# Patient Record
Sex: Female | Born: 1935 | Race: White | Hispanic: No | Smoking: Never smoker
Health system: Southern US, Community
[De-identification: ages and names within clinical notes are randomized; demographics above are authoritative.]

## PROBLEM LIST (undated history)

## (undated) DIAGNOSIS — B338 Other specified viral diseases: Secondary | ICD-10-CM

## (undated) DIAGNOSIS — E119 Type 2 diabetes mellitus without complications: Secondary | ICD-10-CM

## (undated) DIAGNOSIS — M1990 Unspecified osteoarthritis, unspecified site: Secondary | ICD-10-CM

## (undated) DIAGNOSIS — G5600 Carpal tunnel syndrome, unspecified upper limb: Secondary | ICD-10-CM

## (undated) DIAGNOSIS — H3530 Unspecified macular degeneration: Secondary | ICD-10-CM

## (undated) DIAGNOSIS — N390 Urinary tract infection, site not specified: Secondary | ICD-10-CM

## (undated) DIAGNOSIS — E871 Hypo-osmolality and hyponatremia: Secondary | ICD-10-CM

## (undated) HISTORY — PX: BACK SURGERY: SHX140

## (undated) HISTORY — PX: TONSILLECTOMY: SUR1361

## (undated) HISTORY — PX: APPENDECTOMY: SHX54

## (undated) HISTORY — PX: CHOLECYSTECTOMY: SHX55

## (undated) HISTORY — PX: HIP SURGERY: SHX245

## (undated) HISTORY — PX: ABDOMINAL HYSTERECTOMY: SHX81

---

## 2015-02-22 DIAGNOSIS — L570 Actinic keratosis: Secondary | ICD-10-CM | POA: Diagnosis not present

## 2015-02-27 DIAGNOSIS — E782 Mixed hyperlipidemia: Secondary | ICD-10-CM | POA: Diagnosis not present

## 2015-02-27 DIAGNOSIS — I10 Essential (primary) hypertension: Secondary | ICD-10-CM | POA: Diagnosis not present

## 2015-02-27 DIAGNOSIS — E1165 Type 2 diabetes mellitus with hyperglycemia: Secondary | ICD-10-CM | POA: Diagnosis not present

## 2015-03-06 DIAGNOSIS — Z1389 Encounter for screening for other disorder: Secondary | ICD-10-CM | POA: Diagnosis not present

## 2015-03-06 DIAGNOSIS — I10 Essential (primary) hypertension: Secondary | ICD-10-CM | POA: Diagnosis not present

## 2015-03-06 DIAGNOSIS — M25552 Pain in left hip: Secondary | ICD-10-CM | POA: Diagnosis not present

## 2015-03-06 DIAGNOSIS — E1165 Type 2 diabetes mellitus with hyperglycemia: Secondary | ICD-10-CM | POA: Diagnosis not present

## 2015-03-06 DIAGNOSIS — M545 Low back pain: Secondary | ICD-10-CM | POA: Diagnosis not present

## 2015-03-06 DIAGNOSIS — E782 Mixed hyperlipidemia: Secondary | ICD-10-CM | POA: Diagnosis not present

## 2015-05-28 DIAGNOSIS — M79652 Pain in left thigh: Secondary | ICD-10-CM | POA: Diagnosis not present

## 2015-05-28 DIAGNOSIS — S199XXA Unspecified injury of neck, initial encounter: Secondary | ICD-10-CM | POA: Diagnosis not present

## 2015-05-28 DIAGNOSIS — S0990XA Unspecified injury of head, initial encounter: Secondary | ICD-10-CM | POA: Diagnosis not present

## 2015-05-28 DIAGNOSIS — W108XXA Fall (on) (from) other stairs and steps, initial encounter: Secondary | ICD-10-CM | POA: Diagnosis not present

## 2015-05-28 DIAGNOSIS — S4992XA Unspecified injury of left shoulder and upper arm, initial encounter: Secondary | ICD-10-CM | POA: Diagnosis not present

## 2015-05-28 DIAGNOSIS — Z886 Allergy status to analgesic agent status: Secondary | ICD-10-CM | POA: Diagnosis not present

## 2015-05-28 DIAGNOSIS — Z79899 Other long term (current) drug therapy: Secondary | ICD-10-CM | POA: Diagnosis not present

## 2015-05-28 DIAGNOSIS — S79922A Unspecified injury of left thigh, initial encounter: Secondary | ICD-10-CM | POA: Diagnosis not present

## 2015-05-28 DIAGNOSIS — S0191XA Laceration without foreign body of unspecified part of head, initial encounter: Secondary | ICD-10-CM | POA: Diagnosis not present

## 2015-05-28 DIAGNOSIS — Z880 Allergy status to penicillin: Secondary | ICD-10-CM | POA: Diagnosis not present

## 2015-05-28 DIAGNOSIS — E119 Type 2 diabetes mellitus without complications: Secondary | ICD-10-CM | POA: Diagnosis not present

## 2015-05-28 DIAGNOSIS — S0181XA Laceration without foreign body of other part of head, initial encounter: Secondary | ICD-10-CM | POA: Diagnosis not present

## 2015-05-28 DIAGNOSIS — W1789XA Other fall from one level to another, initial encounter: Secondary | ICD-10-CM | POA: Diagnosis not present

## 2015-05-28 DIAGNOSIS — M6281 Muscle weakness (generalized): Secondary | ICD-10-CM | POA: Diagnosis not present

## 2015-05-28 DIAGNOSIS — E871 Hypo-osmolality and hyponatremia: Secondary | ICD-10-CM | POA: Diagnosis not present

## 2015-05-28 DIAGNOSIS — M79622 Pain in left upper arm: Secondary | ICD-10-CM | POA: Diagnosis not present

## 2015-05-28 DIAGNOSIS — R262 Difficulty in walking, not elsewhere classified: Secondary | ICD-10-CM | POA: Diagnosis not present

## 2015-05-28 DIAGNOSIS — M1990 Unspecified osteoarthritis, unspecified site: Secondary | ICD-10-CM | POA: Diagnosis not present

## 2015-05-28 DIAGNOSIS — S2232XA Fracture of one rib, left side, initial encounter for closed fracture: Secondary | ICD-10-CM | POA: Diagnosis not present

## 2015-05-28 DIAGNOSIS — I10 Essential (primary) hypertension: Secondary | ICD-10-CM | POA: Diagnosis not present

## 2015-05-28 DIAGNOSIS — R079 Chest pain, unspecified: Secondary | ICD-10-CM | POA: Diagnosis not present

## 2015-05-29 DIAGNOSIS — S2232XA Fracture of one rib, left side, initial encounter for closed fracture: Secondary | ICD-10-CM | POA: Diagnosis not present

## 2015-05-29 DIAGNOSIS — S0181XA Laceration without foreign body of other part of head, initial encounter: Secondary | ICD-10-CM | POA: Diagnosis not present

## 2015-05-29 DIAGNOSIS — W1789XA Other fall from one level to another, initial encounter: Secondary | ICD-10-CM | POA: Diagnosis not present

## 2015-05-30 DIAGNOSIS — Z79899 Other long term (current) drug therapy: Secondary | ICD-10-CM | POA: Diagnosis not present

## 2015-05-30 DIAGNOSIS — E119 Type 2 diabetes mellitus without complications: Secondary | ICD-10-CM | POA: Diagnosis not present

## 2015-05-30 DIAGNOSIS — M1990 Unspecified osteoarthritis, unspecified site: Secondary | ICD-10-CM | POA: Diagnosis not present

## 2015-05-30 DIAGNOSIS — S2232XK Fracture of one rib, left side, subsequent encounter for fracture with nonunion: Secondary | ICD-10-CM | POA: Diagnosis not present

## 2015-05-30 DIAGNOSIS — E1165 Type 2 diabetes mellitus with hyperglycemia: Secondary | ICD-10-CM | POA: Diagnosis not present

## 2015-05-30 DIAGNOSIS — W19XXXD Unspecified fall, subsequent encounter: Secondary | ICD-10-CM | POA: Diagnosis not present

## 2015-05-30 DIAGNOSIS — R262 Difficulty in walking, not elsewhere classified: Secondary | ICD-10-CM | POA: Diagnosis not present

## 2015-05-30 DIAGNOSIS — Z886 Allergy status to analgesic agent status: Secondary | ICD-10-CM | POA: Diagnosis not present

## 2015-05-30 DIAGNOSIS — Z880 Allergy status to penicillin: Secondary | ICD-10-CM | POA: Diagnosis not present

## 2015-05-30 DIAGNOSIS — S0181XA Laceration without foreign body of other part of head, initial encounter: Secondary | ICD-10-CM | POA: Diagnosis not present

## 2015-05-30 DIAGNOSIS — M6281 Muscle weakness (generalized): Secondary | ICD-10-CM | POA: Diagnosis not present

## 2015-05-30 DIAGNOSIS — W1789XA Other fall from one level to another, initial encounter: Secondary | ICD-10-CM | POA: Diagnosis not present

## 2015-05-30 DIAGNOSIS — E871 Hypo-osmolality and hyponatremia: Secondary | ICD-10-CM | POA: Diagnosis not present

## 2015-05-30 DIAGNOSIS — R2689 Other abnormalities of gait and mobility: Secondary | ICD-10-CM | POA: Diagnosis not present

## 2015-05-30 DIAGNOSIS — I10 Essential (primary) hypertension: Secondary | ICD-10-CM | POA: Diagnosis not present

## 2015-05-30 DIAGNOSIS — S2232XA Fracture of one rib, left side, initial encounter for closed fracture: Secondary | ICD-10-CM | POA: Diagnosis not present

## 2015-05-30 DIAGNOSIS — S2232XG Fracture of one rib, left side, subsequent encounter for fracture with delayed healing: Secondary | ICD-10-CM | POA: Diagnosis not present

## 2015-05-31 DIAGNOSIS — S2232XK Fracture of one rib, left side, subsequent encounter for fracture with nonunion: Secondary | ICD-10-CM | POA: Diagnosis not present

## 2015-06-21 DIAGNOSIS — E1165 Type 2 diabetes mellitus with hyperglycemia: Secondary | ICD-10-CM | POA: Diagnosis not present

## 2015-06-22 DIAGNOSIS — E119 Type 2 diabetes mellitus without complications: Secondary | ICD-10-CM | POA: Diagnosis not present

## 2015-06-22 DIAGNOSIS — S2232XD Fracture of one rib, left side, subsequent encounter for fracture with routine healing: Secondary | ICD-10-CM | POA: Diagnosis not present

## 2015-06-22 DIAGNOSIS — E871 Hypo-osmolality and hyponatremia: Secondary | ICD-10-CM | POA: Diagnosis not present

## 2015-06-22 DIAGNOSIS — Z87440 Personal history of urinary (tract) infections: Secondary | ICD-10-CM | POA: Diagnosis not present

## 2015-06-22 DIAGNOSIS — I10 Essential (primary) hypertension: Secondary | ICD-10-CM | POA: Diagnosis not present

## 2015-06-22 DIAGNOSIS — W19XXXD Unspecified fall, subsequent encounter: Secondary | ICD-10-CM | POA: Diagnosis not present

## 2015-06-22 DIAGNOSIS — M1990 Unspecified osteoarthritis, unspecified site: Secondary | ICD-10-CM | POA: Diagnosis not present

## 2015-06-25 DIAGNOSIS — R2689 Other abnormalities of gait and mobility: Secondary | ICD-10-CM | POA: Diagnosis not present

## 2015-06-25 DIAGNOSIS — M6281 Muscle weakness (generalized): Secondary | ICD-10-CM | POA: Diagnosis not present

## 2015-06-26 DIAGNOSIS — I10 Essential (primary) hypertension: Secondary | ICD-10-CM | POA: Diagnosis not present

## 2015-06-26 DIAGNOSIS — E119 Type 2 diabetes mellitus without complications: Secondary | ICD-10-CM | POA: Diagnosis not present

## 2015-06-26 DIAGNOSIS — Z87440 Personal history of urinary (tract) infections: Secondary | ICD-10-CM | POA: Diagnosis not present

## 2015-06-26 DIAGNOSIS — M1990 Unspecified osteoarthritis, unspecified site: Secondary | ICD-10-CM | POA: Diagnosis not present

## 2015-06-26 DIAGNOSIS — S2232XD Fracture of one rib, left side, subsequent encounter for fracture with routine healing: Secondary | ICD-10-CM | POA: Diagnosis not present

## 2015-06-26 DIAGNOSIS — E871 Hypo-osmolality and hyponatremia: Secondary | ICD-10-CM | POA: Diagnosis not present

## 2015-06-26 DIAGNOSIS — W19XXXD Unspecified fall, subsequent encounter: Secondary | ICD-10-CM | POA: Diagnosis not present

## 2015-06-27 DIAGNOSIS — E119 Type 2 diabetes mellitus without complications: Secondary | ICD-10-CM | POA: Diagnosis not present

## 2015-06-27 DIAGNOSIS — M1990 Unspecified osteoarthritis, unspecified site: Secondary | ICD-10-CM | POA: Diagnosis not present

## 2015-06-27 DIAGNOSIS — W19XXXD Unspecified fall, subsequent encounter: Secondary | ICD-10-CM | POA: Diagnosis not present

## 2015-06-27 DIAGNOSIS — S2232XD Fracture of one rib, left side, subsequent encounter for fracture with routine healing: Secondary | ICD-10-CM | POA: Diagnosis not present

## 2015-06-27 DIAGNOSIS — I10 Essential (primary) hypertension: Secondary | ICD-10-CM | POA: Diagnosis not present

## 2015-06-27 DIAGNOSIS — E871 Hypo-osmolality and hyponatremia: Secondary | ICD-10-CM | POA: Diagnosis not present

## 2015-06-27 DIAGNOSIS — Z87440 Personal history of urinary (tract) infections: Secondary | ICD-10-CM | POA: Diagnosis not present

## 2015-06-28 DIAGNOSIS — E119 Type 2 diabetes mellitus without complications: Secondary | ICD-10-CM | POA: Diagnosis not present

## 2015-06-28 DIAGNOSIS — S2232XD Fracture of one rib, left side, subsequent encounter for fracture with routine healing: Secondary | ICD-10-CM | POA: Diagnosis not present

## 2015-06-28 DIAGNOSIS — E780 Pure hypercholesterolemia: Secondary | ICD-10-CM | POA: Diagnosis not present

## 2015-06-28 DIAGNOSIS — E871 Hypo-osmolality and hyponatremia: Secondary | ICD-10-CM | POA: Diagnosis not present

## 2015-06-28 DIAGNOSIS — E782 Mixed hyperlipidemia: Secondary | ICD-10-CM | POA: Diagnosis not present

## 2015-06-28 DIAGNOSIS — E1165 Type 2 diabetes mellitus with hyperglycemia: Secondary | ICD-10-CM | POA: Diagnosis not present

## 2015-06-28 DIAGNOSIS — Z87440 Personal history of urinary (tract) infections: Secondary | ICD-10-CM | POA: Diagnosis not present

## 2015-06-28 DIAGNOSIS — M1990 Unspecified osteoarthritis, unspecified site: Secondary | ICD-10-CM | POA: Diagnosis not present

## 2015-06-28 DIAGNOSIS — K219 Gastro-esophageal reflux disease without esophagitis: Secondary | ICD-10-CM | POA: Diagnosis not present

## 2015-06-28 DIAGNOSIS — W19XXXD Unspecified fall, subsequent encounter: Secondary | ICD-10-CM | POA: Diagnosis not present

## 2015-06-28 DIAGNOSIS — I10 Essential (primary) hypertension: Secondary | ICD-10-CM | POA: Diagnosis not present

## 2015-06-29 DIAGNOSIS — S2232XD Fracture of one rib, left side, subsequent encounter for fracture with routine healing: Secondary | ICD-10-CM | POA: Diagnosis not present

## 2015-06-29 DIAGNOSIS — I10 Essential (primary) hypertension: Secondary | ICD-10-CM | POA: Diagnosis not present

## 2015-06-29 DIAGNOSIS — Z87440 Personal history of urinary (tract) infections: Secondary | ICD-10-CM | POA: Diagnosis not present

## 2015-06-29 DIAGNOSIS — M1990 Unspecified osteoarthritis, unspecified site: Secondary | ICD-10-CM | POA: Diagnosis not present

## 2015-06-29 DIAGNOSIS — E871 Hypo-osmolality and hyponatremia: Secondary | ICD-10-CM | POA: Diagnosis not present

## 2015-06-29 DIAGNOSIS — W19XXXD Unspecified fall, subsequent encounter: Secondary | ICD-10-CM | POA: Diagnosis not present

## 2015-06-29 DIAGNOSIS — E119 Type 2 diabetes mellitus without complications: Secondary | ICD-10-CM | POA: Diagnosis not present

## 2015-07-02 DIAGNOSIS — S2242XA Multiple fractures of ribs, left side, initial encounter for closed fracture: Secondary | ICD-10-CM | POA: Diagnosis not present

## 2015-07-02 DIAGNOSIS — M25512 Pain in left shoulder: Secondary | ICD-10-CM | POA: Diagnosis not present

## 2015-07-03 DIAGNOSIS — W19XXXD Unspecified fall, subsequent encounter: Secondary | ICD-10-CM | POA: Diagnosis not present

## 2015-07-03 DIAGNOSIS — Z87440 Personal history of urinary (tract) infections: Secondary | ICD-10-CM | POA: Diagnosis not present

## 2015-07-03 DIAGNOSIS — M1990 Unspecified osteoarthritis, unspecified site: Secondary | ICD-10-CM | POA: Diagnosis not present

## 2015-07-03 DIAGNOSIS — E119 Type 2 diabetes mellitus without complications: Secondary | ICD-10-CM | POA: Diagnosis not present

## 2015-07-03 DIAGNOSIS — S2232XD Fracture of one rib, left side, subsequent encounter for fracture with routine healing: Secondary | ICD-10-CM | POA: Diagnosis not present

## 2015-07-03 DIAGNOSIS — I10 Essential (primary) hypertension: Secondary | ICD-10-CM | POA: Diagnosis not present

## 2015-07-03 DIAGNOSIS — E871 Hypo-osmolality and hyponatremia: Secondary | ICD-10-CM | POA: Diagnosis not present

## 2015-07-04 DIAGNOSIS — W19XXXD Unspecified fall, subsequent encounter: Secondary | ICD-10-CM | POA: Diagnosis not present

## 2015-07-04 DIAGNOSIS — E119 Type 2 diabetes mellitus without complications: Secondary | ICD-10-CM | POA: Diagnosis not present

## 2015-07-04 DIAGNOSIS — Z87440 Personal history of urinary (tract) infections: Secondary | ICD-10-CM | POA: Diagnosis not present

## 2015-07-04 DIAGNOSIS — S2232XD Fracture of one rib, left side, subsequent encounter for fracture with routine healing: Secondary | ICD-10-CM | POA: Diagnosis not present

## 2015-07-04 DIAGNOSIS — M1990 Unspecified osteoarthritis, unspecified site: Secondary | ICD-10-CM | POA: Diagnosis not present

## 2015-07-04 DIAGNOSIS — I10 Essential (primary) hypertension: Secondary | ICD-10-CM | POA: Diagnosis not present

## 2015-07-04 DIAGNOSIS — E871 Hypo-osmolality and hyponatremia: Secondary | ICD-10-CM | POA: Diagnosis not present

## 2015-07-06 DIAGNOSIS — E119 Type 2 diabetes mellitus without complications: Secondary | ICD-10-CM | POA: Diagnosis not present

## 2015-07-06 DIAGNOSIS — I10 Essential (primary) hypertension: Secondary | ICD-10-CM | POA: Diagnosis not present

## 2015-07-06 DIAGNOSIS — S2232XD Fracture of one rib, left side, subsequent encounter for fracture with routine healing: Secondary | ICD-10-CM | POA: Diagnosis not present

## 2015-07-06 DIAGNOSIS — E871 Hypo-osmolality and hyponatremia: Secondary | ICD-10-CM | POA: Diagnosis not present

## 2015-07-06 DIAGNOSIS — M1990 Unspecified osteoarthritis, unspecified site: Secondary | ICD-10-CM | POA: Diagnosis not present

## 2015-07-06 DIAGNOSIS — W19XXXD Unspecified fall, subsequent encounter: Secondary | ICD-10-CM | POA: Diagnosis not present

## 2015-07-06 DIAGNOSIS — Z87440 Personal history of urinary (tract) infections: Secondary | ICD-10-CM | POA: Diagnosis not present

## 2015-07-11 DIAGNOSIS — Z780 Asymptomatic menopausal state: Secondary | ICD-10-CM | POA: Diagnosis not present

## 2015-07-11 DIAGNOSIS — Z13820 Encounter for screening for osteoporosis: Secondary | ICD-10-CM | POA: Diagnosis not present

## 2015-07-20 DIAGNOSIS — E119 Type 2 diabetes mellitus without complications: Secondary | ICD-10-CM | POA: Diagnosis not present

## 2015-07-20 DIAGNOSIS — E871 Hypo-osmolality and hyponatremia: Secondary | ICD-10-CM | POA: Diagnosis not present

## 2015-07-20 DIAGNOSIS — I10 Essential (primary) hypertension: Secondary | ICD-10-CM | POA: Diagnosis not present

## 2015-07-20 DIAGNOSIS — S2232XD Fracture of one rib, left side, subsequent encounter for fracture with routine healing: Secondary | ICD-10-CM | POA: Diagnosis not present

## 2015-07-21 DIAGNOSIS — I10 Essential (primary) hypertension: Secondary | ICD-10-CM | POA: Diagnosis not present

## 2015-07-21 DIAGNOSIS — E782 Mixed hyperlipidemia: Secondary | ICD-10-CM | POA: Diagnosis not present

## 2015-07-21 DIAGNOSIS — E11319 Type 2 diabetes mellitus with unspecified diabetic retinopathy without macular edema: Secondary | ICD-10-CM | POA: Diagnosis not present

## 2015-07-21 DIAGNOSIS — M25512 Pain in left shoulder: Secondary | ICD-10-CM | POA: Diagnosis not present

## 2015-07-21 DIAGNOSIS — E1165 Type 2 diabetes mellitus with hyperglycemia: Secondary | ICD-10-CM | POA: Diagnosis not present

## 2015-08-22 DIAGNOSIS — I951 Orthostatic hypotension: Secondary | ICD-10-CM | POA: Diagnosis not present

## 2015-08-22 DIAGNOSIS — Z23 Encounter for immunization: Secondary | ICD-10-CM | POA: Diagnosis not present

## 2015-08-22 DIAGNOSIS — R42 Dizziness and giddiness: Secondary | ICD-10-CM | POA: Diagnosis not present

## 2015-08-22 DIAGNOSIS — E1165 Type 2 diabetes mellitus with hyperglycemia: Secondary | ICD-10-CM | POA: Diagnosis not present

## 2015-08-22 DIAGNOSIS — R5383 Other fatigue: Secondary | ICD-10-CM | POA: Diagnosis not present

## 2015-09-03 DIAGNOSIS — M545 Low back pain: Secondary | ICD-10-CM | POA: Diagnosis not present

## 2015-09-03 DIAGNOSIS — R42 Dizziness and giddiness: Secondary | ICD-10-CM | POA: Diagnosis not present

## 2015-09-03 DIAGNOSIS — M60869 Other myositis, unspecified lower leg: Secondary | ICD-10-CM | POA: Diagnosis not present

## 2015-09-03 DIAGNOSIS — R2681 Unsteadiness on feet: Secondary | ICD-10-CM | POA: Diagnosis not present

## 2015-09-03 DIAGNOSIS — M419 Scoliosis, unspecified: Secondary | ICD-10-CM | POA: Diagnosis not present

## 2015-09-17 DIAGNOSIS — M60869 Other myositis, unspecified lower leg: Secondary | ICD-10-CM | POA: Diagnosis not present

## 2015-09-17 DIAGNOSIS — R2681 Unsteadiness on feet: Secondary | ICD-10-CM | POA: Diagnosis not present

## 2015-09-17 DIAGNOSIS — M545 Low back pain: Secondary | ICD-10-CM | POA: Diagnosis not present

## 2015-09-17 DIAGNOSIS — M419 Scoliosis, unspecified: Secondary | ICD-10-CM | POA: Diagnosis not present

## 2015-09-17 DIAGNOSIS — S46092A Other injury of muscle(s) and tendon(s) of the rotator cuff of left shoulder, initial encounter: Secondary | ICD-10-CM | POA: Diagnosis not present

## 2015-09-17 DIAGNOSIS — R42 Dizziness and giddiness: Secondary | ICD-10-CM | POA: Diagnosis not present

## 2015-09-20 DIAGNOSIS — E119 Type 2 diabetes mellitus without complications: Secondary | ICD-10-CM | POA: Diagnosis not present

## 2015-09-20 DIAGNOSIS — I10 Essential (primary) hypertension: Secondary | ICD-10-CM | POA: Diagnosis not present

## 2015-09-20 DIAGNOSIS — I361 Nonrheumatic tricuspid (valve) insufficiency: Secondary | ICD-10-CM | POA: Diagnosis not present

## 2015-09-20 DIAGNOSIS — I340 Nonrheumatic mitral (valve) insufficiency: Secondary | ICD-10-CM | POA: Diagnosis not present

## 2015-09-20 DIAGNOSIS — S4292XD Fracture of left shoulder girdle, part unspecified, subsequent encounter for fracture with routine healing: Secondary | ICD-10-CM | POA: Diagnosis not present

## 2015-09-20 DIAGNOSIS — Z9181 History of falling: Secondary | ICD-10-CM | POA: Diagnosis not present

## 2015-09-20 DIAGNOSIS — E785 Hyperlipidemia, unspecified: Secondary | ICD-10-CM | POA: Diagnosis not present

## 2015-09-20 DIAGNOSIS — I6521 Occlusion and stenosis of right carotid artery: Secondary | ICD-10-CM | POA: Diagnosis not present

## 2015-09-25 DIAGNOSIS — I10 Essential (primary) hypertension: Secondary | ICD-10-CM | POA: Diagnosis not present

## 2015-09-25 DIAGNOSIS — S4292XD Fracture of left shoulder girdle, part unspecified, subsequent encounter for fracture with routine healing: Secondary | ICD-10-CM | POA: Diagnosis not present

## 2015-09-25 DIAGNOSIS — E785 Hyperlipidemia, unspecified: Secondary | ICD-10-CM | POA: Diagnosis not present

## 2015-09-25 DIAGNOSIS — E119 Type 2 diabetes mellitus without complications: Secondary | ICD-10-CM | POA: Diagnosis not present

## 2015-09-28 DIAGNOSIS — S4292XD Fracture of left shoulder girdle, part unspecified, subsequent encounter for fracture with routine healing: Secondary | ICD-10-CM | POA: Diagnosis not present

## 2015-09-28 DIAGNOSIS — I10 Essential (primary) hypertension: Secondary | ICD-10-CM | POA: Diagnosis not present

## 2015-09-28 DIAGNOSIS — E785 Hyperlipidemia, unspecified: Secondary | ICD-10-CM | POA: Diagnosis not present

## 2015-09-28 DIAGNOSIS — E119 Type 2 diabetes mellitus without complications: Secondary | ICD-10-CM | POA: Diagnosis not present

## 2015-10-02 DIAGNOSIS — I10 Essential (primary) hypertension: Secondary | ICD-10-CM | POA: Diagnosis not present

## 2015-10-02 DIAGNOSIS — E119 Type 2 diabetes mellitus without complications: Secondary | ICD-10-CM | POA: Diagnosis not present

## 2015-10-02 DIAGNOSIS — S4292XD Fracture of left shoulder girdle, part unspecified, subsequent encounter for fracture with routine healing: Secondary | ICD-10-CM | POA: Diagnosis not present

## 2015-10-02 DIAGNOSIS — E785 Hyperlipidemia, unspecified: Secondary | ICD-10-CM | POA: Diagnosis not present

## 2015-10-04 DIAGNOSIS — I10 Essential (primary) hypertension: Secondary | ICD-10-CM | POA: Diagnosis not present

## 2015-10-04 DIAGNOSIS — E119 Type 2 diabetes mellitus without complications: Secondary | ICD-10-CM | POA: Diagnosis not present

## 2015-10-04 DIAGNOSIS — E785 Hyperlipidemia, unspecified: Secondary | ICD-10-CM | POA: Diagnosis not present

## 2015-10-04 DIAGNOSIS — S4292XD Fracture of left shoulder girdle, part unspecified, subsequent encounter for fracture with routine healing: Secondary | ICD-10-CM | POA: Diagnosis not present

## 2015-10-09 DIAGNOSIS — S4292XD Fracture of left shoulder girdle, part unspecified, subsequent encounter for fracture with routine healing: Secondary | ICD-10-CM | POA: Diagnosis not present

## 2015-10-09 DIAGNOSIS — E119 Type 2 diabetes mellitus without complications: Secondary | ICD-10-CM | POA: Diagnosis not present

## 2015-10-09 DIAGNOSIS — I10 Essential (primary) hypertension: Secondary | ICD-10-CM | POA: Diagnosis not present

## 2015-10-09 DIAGNOSIS — E785 Hyperlipidemia, unspecified: Secondary | ICD-10-CM | POA: Diagnosis not present

## 2015-10-17 DIAGNOSIS — I10 Essential (primary) hypertension: Secondary | ICD-10-CM | POA: Diagnosis not present

## 2015-10-17 DIAGNOSIS — E782 Mixed hyperlipidemia: Secondary | ICD-10-CM | POA: Diagnosis not present

## 2015-10-17 DIAGNOSIS — M47816 Spondylosis without myelopathy or radiculopathy, lumbar region: Secondary | ICD-10-CM | POA: Diagnosis not present

## 2015-10-17 DIAGNOSIS — K219 Gastro-esophageal reflux disease without esophagitis: Secondary | ICD-10-CM | POA: Diagnosis not present

## 2015-10-17 DIAGNOSIS — E1165 Type 2 diabetes mellitus with hyperglycemia: Secondary | ICD-10-CM | POA: Diagnosis not present

## 2015-10-19 DIAGNOSIS — E1165 Type 2 diabetes mellitus with hyperglycemia: Secondary | ICD-10-CM | POA: Diagnosis not present

## 2015-10-19 DIAGNOSIS — I10 Essential (primary) hypertension: Secondary | ICD-10-CM | POA: Diagnosis not present

## 2015-10-19 DIAGNOSIS — E782 Mixed hyperlipidemia: Secondary | ICD-10-CM | POA: Diagnosis not present

## 2015-10-24 DIAGNOSIS — M545 Low back pain: Secondary | ICD-10-CM | POA: Diagnosis not present

## 2015-10-24 DIAGNOSIS — E1165 Type 2 diabetes mellitus with hyperglycemia: Secondary | ICD-10-CM | POA: Diagnosis not present

## 2015-10-24 DIAGNOSIS — I10 Essential (primary) hypertension: Secondary | ICD-10-CM | POA: Diagnosis not present

## 2015-10-24 DIAGNOSIS — M25552 Pain in left hip: Secondary | ICD-10-CM | POA: Diagnosis not present

## 2015-10-24 DIAGNOSIS — E782 Mixed hyperlipidemia: Secondary | ICD-10-CM | POA: Diagnosis not present

## 2015-11-15 DIAGNOSIS — E119 Type 2 diabetes mellitus without complications: Secondary | ICD-10-CM | POA: Diagnosis not present

## 2015-11-15 DIAGNOSIS — H40033 Anatomical narrow angle, bilateral: Secondary | ICD-10-CM | POA: Diagnosis not present

## 2016-02-12 DIAGNOSIS — E871 Hypo-osmolality and hyponatremia: Secondary | ICD-10-CM | POA: Diagnosis not present

## 2016-02-12 DIAGNOSIS — I10 Essential (primary) hypertension: Secondary | ICD-10-CM | POA: Diagnosis not present

## 2016-02-12 DIAGNOSIS — E1165 Type 2 diabetes mellitus with hyperglycemia: Secondary | ICD-10-CM | POA: Diagnosis not present

## 2016-02-12 DIAGNOSIS — E7800 Pure hypercholesterolemia, unspecified: Secondary | ICD-10-CM | POA: Diagnosis not present

## 2016-02-12 DIAGNOSIS — E782 Mixed hyperlipidemia: Secondary | ICD-10-CM | POA: Diagnosis not present

## 2016-02-19 DIAGNOSIS — M25552 Pain in left hip: Secondary | ICD-10-CM | POA: Diagnosis not present

## 2016-02-19 DIAGNOSIS — M545 Low back pain: Secondary | ICD-10-CM | POA: Diagnosis not present

## 2016-02-19 DIAGNOSIS — E782 Mixed hyperlipidemia: Secondary | ICD-10-CM | POA: Diagnosis not present

## 2016-02-19 DIAGNOSIS — E1165 Type 2 diabetes mellitus with hyperglycemia: Secondary | ICD-10-CM | POA: Diagnosis not present

## 2016-02-19 DIAGNOSIS — I10 Essential (primary) hypertension: Secondary | ICD-10-CM | POA: Diagnosis not present

## 2016-07-02 DIAGNOSIS — E1165 Type 2 diabetes mellitus with hyperglycemia: Secondary | ICD-10-CM | POA: Diagnosis not present

## 2016-07-02 DIAGNOSIS — E782 Mixed hyperlipidemia: Secondary | ICD-10-CM | POA: Diagnosis not present

## 2016-07-02 DIAGNOSIS — E7800 Pure hypercholesterolemia, unspecified: Secondary | ICD-10-CM | POA: Diagnosis not present

## 2016-07-02 DIAGNOSIS — E11319 Type 2 diabetes mellitus with unspecified diabetic retinopathy without macular edema: Secondary | ICD-10-CM | POA: Diagnosis not present

## 2016-07-02 DIAGNOSIS — E871 Hypo-osmolality and hyponatremia: Secondary | ICD-10-CM | POA: Diagnosis not present

## 2016-07-07 DIAGNOSIS — Z1389 Encounter for screening for other disorder: Secondary | ICD-10-CM | POA: Diagnosis not present

## 2016-07-07 DIAGNOSIS — E782 Mixed hyperlipidemia: Secondary | ICD-10-CM | POA: Diagnosis not present

## 2016-07-07 DIAGNOSIS — I10 Essential (primary) hypertension: Secondary | ICD-10-CM | POA: Diagnosis not present

## 2016-07-07 DIAGNOSIS — M25552 Pain in left hip: Secondary | ICD-10-CM | POA: Diagnosis not present

## 2016-07-07 DIAGNOSIS — E1165 Type 2 diabetes mellitus with hyperglycemia: Secondary | ICD-10-CM | POA: Diagnosis not present

## 2016-07-07 DIAGNOSIS — M545 Low back pain: Secondary | ICD-10-CM | POA: Diagnosis not present

## 2016-07-08 DIAGNOSIS — S46012A Strain of muscle(s) and tendon(s) of the rotator cuff of left shoulder, initial encounter: Secondary | ICD-10-CM | POA: Diagnosis not present

## 2016-08-25 DIAGNOSIS — Z23 Encounter for immunization: Secondary | ICD-10-CM | POA: Diagnosis not present

## 2016-11-03 DIAGNOSIS — E871 Hypo-osmolality and hyponatremia: Secondary | ICD-10-CM | POA: Diagnosis not present

## 2016-11-03 DIAGNOSIS — E11319 Type 2 diabetes mellitus with unspecified diabetic retinopathy without macular edema: Secondary | ICD-10-CM | POA: Diagnosis not present

## 2016-11-03 DIAGNOSIS — E782 Mixed hyperlipidemia: Secondary | ICD-10-CM | POA: Diagnosis not present

## 2016-11-03 DIAGNOSIS — N183 Chronic kidney disease, stage 3 (moderate): Secondary | ICD-10-CM | POA: Diagnosis not present

## 2016-11-03 DIAGNOSIS — E7800 Pure hypercholesterolemia, unspecified: Secondary | ICD-10-CM | POA: Diagnosis not present

## 2016-11-05 DIAGNOSIS — E782 Mixed hyperlipidemia: Secondary | ICD-10-CM | POA: Diagnosis not present

## 2016-11-05 DIAGNOSIS — M545 Low back pain: Secondary | ICD-10-CM | POA: Diagnosis not present

## 2016-11-05 DIAGNOSIS — E1165 Type 2 diabetes mellitus with hyperglycemia: Secondary | ICD-10-CM | POA: Diagnosis not present

## 2016-11-05 DIAGNOSIS — I10 Essential (primary) hypertension: Secondary | ICD-10-CM | POA: Diagnosis not present

## 2016-11-05 DIAGNOSIS — M25552 Pain in left hip: Secondary | ICD-10-CM | POA: Diagnosis not present

## 2016-11-13 DIAGNOSIS — H2513 Age-related nuclear cataract, bilateral: Secondary | ICD-10-CM | POA: Diagnosis not present

## 2016-11-13 DIAGNOSIS — H40033 Anatomical narrow angle, bilateral: Secondary | ICD-10-CM | POA: Diagnosis not present

## 2017-03-03 DIAGNOSIS — E119 Type 2 diabetes mellitus without complications: Secondary | ICD-10-CM | POA: Diagnosis not present

## 2017-03-03 DIAGNOSIS — K219 Gastro-esophageal reflux disease without esophagitis: Secondary | ICD-10-CM | POA: Diagnosis not present

## 2017-03-03 DIAGNOSIS — E782 Mixed hyperlipidemia: Secondary | ICD-10-CM | POA: Diagnosis not present

## 2017-03-03 DIAGNOSIS — N183 Chronic kidney disease, stage 3 (moderate): Secondary | ICD-10-CM | POA: Diagnosis not present

## 2017-03-03 DIAGNOSIS — I10 Essential (primary) hypertension: Secondary | ICD-10-CM | POA: Diagnosis not present

## 2017-03-09 DIAGNOSIS — M545 Low back pain: Secondary | ICD-10-CM | POA: Diagnosis not present

## 2017-03-09 DIAGNOSIS — E1165 Type 2 diabetes mellitus with hyperglycemia: Secondary | ICD-10-CM | POA: Diagnosis not present

## 2017-03-09 DIAGNOSIS — I10 Essential (primary) hypertension: Secondary | ICD-10-CM | POA: Diagnosis not present

## 2017-03-09 DIAGNOSIS — E782 Mixed hyperlipidemia: Secondary | ICD-10-CM | POA: Diagnosis not present

## 2017-03-09 DIAGNOSIS — M25552 Pain in left hip: Secondary | ICD-10-CM | POA: Diagnosis not present

## 2017-05-31 DIAGNOSIS — I10 Essential (primary) hypertension: Secondary | ICD-10-CM | POA: Diagnosis not present

## 2017-05-31 DIAGNOSIS — Z7984 Long term (current) use of oral hypoglycemic drugs: Secondary | ICD-10-CM | POA: Diagnosis not present

## 2017-05-31 DIAGNOSIS — M545 Low back pain: Secondary | ICD-10-CM | POA: Diagnosis not present

## 2017-05-31 DIAGNOSIS — M1990 Unspecified osteoarthritis, unspecified site: Secondary | ICD-10-CM | POA: Diagnosis not present

## 2017-05-31 DIAGNOSIS — E119 Type 2 diabetes mellitus without complications: Secondary | ICD-10-CM | POA: Diagnosis not present

## 2017-05-31 DIAGNOSIS — E871 Hypo-osmolality and hyponatremia: Secondary | ICD-10-CM | POA: Diagnosis not present

## 2017-05-31 DIAGNOSIS — M79651 Pain in right thigh: Secondary | ICD-10-CM | POA: Diagnosis not present

## 2017-05-31 DIAGNOSIS — Z79899 Other long term (current) drug therapy: Secondary | ICD-10-CM | POA: Diagnosis not present

## 2017-05-31 DIAGNOSIS — Z880 Allergy status to penicillin: Secondary | ICD-10-CM | POA: Diagnosis not present

## 2017-05-31 DIAGNOSIS — R531 Weakness: Secondary | ICD-10-CM | POA: Diagnosis not present

## 2017-05-31 DIAGNOSIS — R319 Hematuria, unspecified: Secondary | ICD-10-CM | POA: Diagnosis not present

## 2017-05-31 DIAGNOSIS — R404 Transient alteration of awareness: Secondary | ICD-10-CM | POA: Diagnosis not present

## 2017-05-31 DIAGNOSIS — N390 Urinary tract infection, site not specified: Secondary | ICD-10-CM | POA: Diagnosis not present

## 2017-05-31 DIAGNOSIS — R110 Nausea: Secondary | ICD-10-CM | POA: Diagnosis not present

## 2017-05-31 DIAGNOSIS — M25551 Pain in right hip: Secondary | ICD-10-CM | POA: Diagnosis not present

## 2017-05-31 DIAGNOSIS — R55 Syncope and collapse: Secondary | ICD-10-CM | POA: Diagnosis not present

## 2017-05-31 DIAGNOSIS — R262 Difficulty in walking, not elsewhere classified: Secondary | ICD-10-CM | POA: Diagnosis not present

## 2017-05-31 DIAGNOSIS — Z886 Allergy status to analgesic agent status: Secondary | ICD-10-CM | POA: Diagnosis not present

## 2017-06-01 DIAGNOSIS — M7989 Other specified soft tissue disorders: Secondary | ICD-10-CM | POA: Diagnosis not present

## 2017-06-01 DIAGNOSIS — E871 Hypo-osmolality and hyponatremia: Secondary | ICD-10-CM | POA: Diagnosis not present

## 2017-06-01 DIAGNOSIS — N390 Urinary tract infection, site not specified: Secondary | ICD-10-CM | POA: Diagnosis not present

## 2017-06-01 DIAGNOSIS — R55 Syncope and collapse: Secondary | ICD-10-CM | POA: Diagnosis not present

## 2017-06-03 DIAGNOSIS — M545 Low back pain: Secondary | ICD-10-CM | POA: Diagnosis not present

## 2017-06-03 DIAGNOSIS — Z7984 Long term (current) use of oral hypoglycemic drugs: Secondary | ICD-10-CM | POA: Diagnosis not present

## 2017-06-03 DIAGNOSIS — E871 Hypo-osmolality and hyponatremia: Secondary | ICD-10-CM | POA: Diagnosis not present

## 2017-06-03 DIAGNOSIS — E119 Type 2 diabetes mellitus without complications: Secondary | ICD-10-CM | POA: Diagnosis not present

## 2017-06-03 DIAGNOSIS — M1991 Primary osteoarthritis, unspecified site: Secondary | ICD-10-CM | POA: Diagnosis not present

## 2017-06-03 DIAGNOSIS — R55 Syncope and collapse: Secondary | ICD-10-CM | POA: Diagnosis not present

## 2017-06-03 DIAGNOSIS — M25551 Pain in right hip: Secondary | ICD-10-CM | POA: Diagnosis not present

## 2017-06-03 DIAGNOSIS — I10 Essential (primary) hypertension: Secondary | ICD-10-CM | POA: Diagnosis not present

## 2017-06-04 DIAGNOSIS — M545 Low back pain: Secondary | ICD-10-CM | POA: Diagnosis not present

## 2017-06-04 DIAGNOSIS — M47816 Spondylosis without myelopathy or radiculopathy, lumbar region: Secondary | ICD-10-CM | POA: Diagnosis not present

## 2017-06-08 DIAGNOSIS — E871 Hypo-osmolality and hyponatremia: Secondary | ICD-10-CM | POA: Diagnosis not present

## 2017-06-08 DIAGNOSIS — I10 Essential (primary) hypertension: Secondary | ICD-10-CM | POA: Diagnosis not present

## 2017-06-08 DIAGNOSIS — Z7984 Long term (current) use of oral hypoglycemic drugs: Secondary | ICD-10-CM | POA: Diagnosis not present

## 2017-06-08 DIAGNOSIS — R55 Syncope and collapse: Secondary | ICD-10-CM | POA: Diagnosis not present

## 2017-06-08 DIAGNOSIS — M1991 Primary osteoarthritis, unspecified site: Secondary | ICD-10-CM | POA: Diagnosis not present

## 2017-06-08 DIAGNOSIS — M545 Low back pain: Secondary | ICD-10-CM | POA: Diagnosis not present

## 2017-06-08 DIAGNOSIS — E119 Type 2 diabetes mellitus without complications: Secondary | ICD-10-CM | POA: Diagnosis not present

## 2017-06-08 DIAGNOSIS — M25551 Pain in right hip: Secondary | ICD-10-CM | POA: Diagnosis not present

## 2017-06-09 DIAGNOSIS — E119 Type 2 diabetes mellitus without complications: Secondary | ICD-10-CM | POA: Diagnosis not present

## 2017-06-09 DIAGNOSIS — M1991 Primary osteoarthritis, unspecified site: Secondary | ICD-10-CM | POA: Diagnosis not present

## 2017-06-09 DIAGNOSIS — M545 Low back pain: Secondary | ICD-10-CM | POA: Diagnosis not present

## 2017-06-09 DIAGNOSIS — R55 Syncope and collapse: Secondary | ICD-10-CM | POA: Diagnosis not present

## 2017-06-09 DIAGNOSIS — M25551 Pain in right hip: Secondary | ICD-10-CM | POA: Diagnosis not present

## 2017-06-09 DIAGNOSIS — E871 Hypo-osmolality and hyponatremia: Secondary | ICD-10-CM | POA: Diagnosis not present

## 2017-06-09 DIAGNOSIS — I10 Essential (primary) hypertension: Secondary | ICD-10-CM | POA: Diagnosis not present

## 2017-06-09 DIAGNOSIS — Z7984 Long term (current) use of oral hypoglycemic drugs: Secondary | ICD-10-CM | POA: Diagnosis not present

## 2017-06-11 DIAGNOSIS — I10 Essential (primary) hypertension: Secondary | ICD-10-CM | POA: Diagnosis not present

## 2017-06-11 DIAGNOSIS — M545 Low back pain: Secondary | ICD-10-CM | POA: Diagnosis not present

## 2017-06-11 DIAGNOSIS — E871 Hypo-osmolality and hyponatremia: Secondary | ICD-10-CM | POA: Diagnosis not present

## 2017-06-11 DIAGNOSIS — E119 Type 2 diabetes mellitus without complications: Secondary | ICD-10-CM | POA: Diagnosis not present

## 2017-06-11 DIAGNOSIS — M1991 Primary osteoarthritis, unspecified site: Secondary | ICD-10-CM | POA: Diagnosis not present

## 2017-06-11 DIAGNOSIS — M25551 Pain in right hip: Secondary | ICD-10-CM | POA: Diagnosis not present

## 2017-06-11 DIAGNOSIS — R55 Syncope and collapse: Secondary | ICD-10-CM | POA: Diagnosis not present

## 2017-06-11 DIAGNOSIS — Z7984 Long term (current) use of oral hypoglycemic drugs: Secondary | ICD-10-CM | POA: Diagnosis not present

## 2017-06-16 DIAGNOSIS — E119 Type 2 diabetes mellitus without complications: Secondary | ICD-10-CM | POA: Diagnosis not present

## 2017-06-16 DIAGNOSIS — R55 Syncope and collapse: Secondary | ICD-10-CM | POA: Diagnosis not present

## 2017-06-16 DIAGNOSIS — M545 Low back pain: Secondary | ICD-10-CM | POA: Diagnosis not present

## 2017-06-16 DIAGNOSIS — M1991 Primary osteoarthritis, unspecified site: Secondary | ICD-10-CM | POA: Diagnosis not present

## 2017-06-16 DIAGNOSIS — M25551 Pain in right hip: Secondary | ICD-10-CM | POA: Diagnosis not present

## 2017-06-16 DIAGNOSIS — E871 Hypo-osmolality and hyponatremia: Secondary | ICD-10-CM | POA: Diagnosis not present

## 2017-06-16 DIAGNOSIS — I10 Essential (primary) hypertension: Secondary | ICD-10-CM | POA: Diagnosis not present

## 2017-06-16 DIAGNOSIS — Z7984 Long term (current) use of oral hypoglycemic drugs: Secondary | ICD-10-CM | POA: Diagnosis not present

## 2017-06-23 DIAGNOSIS — M545 Low back pain: Secondary | ICD-10-CM | POA: Diagnosis not present

## 2017-06-25 DIAGNOSIS — E119 Type 2 diabetes mellitus without complications: Secondary | ICD-10-CM | POA: Diagnosis not present

## 2017-06-25 DIAGNOSIS — M25551 Pain in right hip: Secondary | ICD-10-CM | POA: Diagnosis not present

## 2017-06-25 DIAGNOSIS — R55 Syncope and collapse: Secondary | ICD-10-CM | POA: Diagnosis not present

## 2017-06-25 DIAGNOSIS — M545 Low back pain: Secondary | ICD-10-CM | POA: Diagnosis not present

## 2017-06-26 DIAGNOSIS — E119 Type 2 diabetes mellitus without complications: Secondary | ICD-10-CM | POA: Diagnosis not present

## 2017-06-26 DIAGNOSIS — E871 Hypo-osmolality and hyponatremia: Secondary | ICD-10-CM | POA: Diagnosis not present

## 2017-06-26 DIAGNOSIS — M25551 Pain in right hip: Secondary | ICD-10-CM | POA: Diagnosis not present

## 2017-06-26 DIAGNOSIS — M545 Low back pain: Secondary | ICD-10-CM | POA: Diagnosis not present

## 2017-06-26 DIAGNOSIS — R55 Syncope and collapse: Secondary | ICD-10-CM | POA: Diagnosis not present

## 2017-06-26 DIAGNOSIS — I10 Essential (primary) hypertension: Secondary | ICD-10-CM | POA: Diagnosis not present

## 2017-06-26 DIAGNOSIS — Z7984 Long term (current) use of oral hypoglycemic drugs: Secondary | ICD-10-CM | POA: Diagnosis not present

## 2017-06-26 DIAGNOSIS — M1991 Primary osteoarthritis, unspecified site: Secondary | ICD-10-CM | POA: Diagnosis not present

## 2017-06-30 DIAGNOSIS — M545 Low back pain: Secondary | ICD-10-CM | POA: Diagnosis not present

## 2017-07-01 DIAGNOSIS — R55 Syncope and collapse: Secondary | ICD-10-CM | POA: Diagnosis not present

## 2017-07-01 DIAGNOSIS — M545 Low back pain: Secondary | ICD-10-CM | POA: Diagnosis not present

## 2017-07-01 DIAGNOSIS — Z6824 Body mass index (BMI) 24.0-24.9, adult: Secondary | ICD-10-CM | POA: Diagnosis not present

## 2017-07-01 DIAGNOSIS — E7800 Pure hypercholesterolemia, unspecified: Secondary | ICD-10-CM | POA: Diagnosis not present

## 2017-07-01 DIAGNOSIS — E1122 Type 2 diabetes mellitus with diabetic chronic kidney disease: Secondary | ICD-10-CM | POA: Diagnosis not present

## 2017-07-01 DIAGNOSIS — N3000 Acute cystitis without hematuria: Secondary | ICD-10-CM | POA: Diagnosis not present

## 2017-07-01 DIAGNOSIS — E11319 Type 2 diabetes mellitus with unspecified diabetic retinopathy without macular edema: Secondary | ICD-10-CM | POA: Diagnosis not present

## 2017-07-01 DIAGNOSIS — E1165 Type 2 diabetes mellitus with hyperglycemia: Secondary | ICD-10-CM | POA: Diagnosis not present

## 2017-07-01 DIAGNOSIS — E871 Hypo-osmolality and hyponatremia: Secondary | ICD-10-CM | POA: Diagnosis not present

## 2017-07-01 DIAGNOSIS — E782 Mixed hyperlipidemia: Secondary | ICD-10-CM | POA: Diagnosis not present

## 2017-07-02 DIAGNOSIS — M545 Low back pain: Secondary | ICD-10-CM | POA: Diagnosis not present

## 2017-07-04 DIAGNOSIS — R110 Nausea: Secondary | ICD-10-CM | POA: Diagnosis not present

## 2017-07-04 DIAGNOSIS — R109 Unspecified abdominal pain: Secondary | ICD-10-CM | POA: Diagnosis not present

## 2017-07-04 DIAGNOSIS — Z6823 Body mass index (BMI) 23.0-23.9, adult: Secondary | ICD-10-CM | POA: Diagnosis not present

## 2017-07-04 DIAGNOSIS — N3091 Cystitis, unspecified with hematuria: Secondary | ICD-10-CM | POA: Diagnosis not present

## 2017-07-07 DIAGNOSIS — M545 Low back pain: Secondary | ICD-10-CM | POA: Diagnosis not present

## 2017-07-09 DIAGNOSIS — I10 Essential (primary) hypertension: Secondary | ICD-10-CM | POA: Diagnosis not present

## 2017-07-09 DIAGNOSIS — E1165 Type 2 diabetes mellitus with hyperglycemia: Secondary | ICD-10-CM | POA: Diagnosis not present

## 2017-07-09 DIAGNOSIS — E782 Mixed hyperlipidemia: Secondary | ICD-10-CM | POA: Diagnosis not present

## 2017-07-09 DIAGNOSIS — E11319 Type 2 diabetes mellitus with unspecified diabetic retinopathy without macular edema: Secondary | ICD-10-CM | POA: Diagnosis not present

## 2017-07-09 DIAGNOSIS — E1122 Type 2 diabetes mellitus with diabetic chronic kidney disease: Secondary | ICD-10-CM | POA: Diagnosis not present

## 2017-07-10 DIAGNOSIS — M545 Low back pain: Secondary | ICD-10-CM | POA: Diagnosis not present

## 2017-07-14 DIAGNOSIS — M545 Low back pain: Secondary | ICD-10-CM | POA: Diagnosis not present

## 2017-07-16 DIAGNOSIS — M545 Low back pain: Secondary | ICD-10-CM | POA: Diagnosis not present

## 2017-07-21 DIAGNOSIS — M545 Low back pain: Secondary | ICD-10-CM | POA: Diagnosis not present

## 2017-07-23 DIAGNOSIS — M545 Low back pain: Secondary | ICD-10-CM | POA: Diagnosis not present

## 2017-07-28 DIAGNOSIS — M545 Low back pain: Secondary | ICD-10-CM | POA: Diagnosis not present

## 2017-07-30 DIAGNOSIS — M545 Low back pain: Secondary | ICD-10-CM | POA: Diagnosis not present

## 2017-08-21 DIAGNOSIS — S8002XA Contusion of left knee, initial encounter: Secondary | ICD-10-CM | POA: Diagnosis not present

## 2017-08-21 DIAGNOSIS — M25462 Effusion, left knee: Secondary | ICD-10-CM | POA: Diagnosis not present

## 2017-08-21 DIAGNOSIS — S81012A Laceration without foreign body, left knee, initial encounter: Secondary | ICD-10-CM | POA: Diagnosis not present

## 2017-08-21 DIAGNOSIS — M25552 Pain in left hip: Secondary | ICD-10-CM | POA: Diagnosis not present

## 2017-08-21 DIAGNOSIS — R0789 Other chest pain: Secondary | ICD-10-CM | POA: Diagnosis not present

## 2017-08-21 DIAGNOSIS — M25562 Pain in left knee: Secondary | ICD-10-CM | POA: Diagnosis not present

## 2017-08-21 DIAGNOSIS — W108XXA Fall (on) (from) other stairs and steps, initial encounter: Secondary | ICD-10-CM | POA: Diagnosis not present

## 2017-08-21 DIAGNOSIS — S51012A Laceration without foreign body of left elbow, initial encounter: Secondary | ICD-10-CM | POA: Diagnosis not present

## 2017-08-21 DIAGNOSIS — S299XXA Unspecified injury of thorax, initial encounter: Secondary | ICD-10-CM | POA: Diagnosis not present

## 2017-08-21 DIAGNOSIS — R52 Pain, unspecified: Secondary | ICD-10-CM | POA: Diagnosis not present

## 2017-08-21 DIAGNOSIS — R0781 Pleurodynia: Secondary | ICD-10-CM | POA: Diagnosis not present

## 2017-08-21 DIAGNOSIS — S79912A Unspecified injury of left hip, initial encounter: Secondary | ICD-10-CM | POA: Diagnosis not present

## 2017-08-22 DIAGNOSIS — Z79899 Other long term (current) drug therapy: Secondary | ICD-10-CM | POA: Diagnosis not present

## 2017-08-22 DIAGNOSIS — S79912A Unspecified injury of left hip, initial encounter: Secondary | ICD-10-CM | POA: Diagnosis not present

## 2017-08-22 DIAGNOSIS — Z880 Allergy status to penicillin: Secondary | ICD-10-CM | POA: Diagnosis not present

## 2017-08-22 DIAGNOSIS — S72012A Unspecified intracapsular fracture of left femur, initial encounter for closed fracture: Secondary | ICD-10-CM | POA: Diagnosis not present

## 2017-08-22 DIAGNOSIS — Z4789 Encounter for other orthopedic aftercare: Secondary | ICD-10-CM | POA: Diagnosis not present

## 2017-08-22 DIAGNOSIS — S8002XA Contusion of left knee, initial encounter: Secondary | ICD-10-CM | POA: Diagnosis not present

## 2017-08-22 DIAGNOSIS — R2689 Other abnormalities of gait and mobility: Secondary | ICD-10-CM | POA: Diagnosis not present

## 2017-08-22 DIAGNOSIS — E871 Hypo-osmolality and hyponatremia: Secondary | ICD-10-CM | POA: Diagnosis not present

## 2017-08-22 DIAGNOSIS — Z7984 Long term (current) use of oral hypoglycemic drugs: Secondary | ICD-10-CM | POA: Diagnosis not present

## 2017-08-22 DIAGNOSIS — S51012A Laceration without foreign body of left elbow, initial encounter: Secondary | ICD-10-CM | POA: Diagnosis not present

## 2017-08-22 DIAGNOSIS — R0781 Pleurodynia: Secondary | ICD-10-CM | POA: Diagnosis not present

## 2017-08-22 DIAGNOSIS — R296 Repeated falls: Secondary | ICD-10-CM | POA: Diagnosis not present

## 2017-08-22 DIAGNOSIS — S72002D Fracture of unspecified part of neck of left femur, subsequent encounter for closed fracture with routine healing: Secondary | ICD-10-CM | POA: Diagnosis not present

## 2017-08-22 DIAGNOSIS — D649 Anemia, unspecified: Secondary | ICD-10-CM | POA: Diagnosis not present

## 2017-08-22 DIAGNOSIS — M6281 Muscle weakness (generalized): Secondary | ICD-10-CM | POA: Diagnosis not present

## 2017-08-22 DIAGNOSIS — R0789 Other chest pain: Secondary | ICD-10-CM | POA: Diagnosis not present

## 2017-08-22 DIAGNOSIS — N390 Urinary tract infection, site not specified: Secondary | ICD-10-CM | POA: Diagnosis not present

## 2017-08-22 DIAGNOSIS — I10 Essential (primary) hypertension: Secondary | ICD-10-CM | POA: Diagnosis not present

## 2017-08-22 DIAGNOSIS — Z888 Allergy status to other drugs, medicaments and biological substances status: Secondary | ICD-10-CM | POA: Diagnosis not present

## 2017-08-22 DIAGNOSIS — S72092A Other fracture of head and neck of left femur, initial encounter for closed fracture: Secondary | ICD-10-CM | POA: Diagnosis not present

## 2017-08-22 DIAGNOSIS — M25552 Pain in left hip: Secondary | ICD-10-CM | POA: Diagnosis not present

## 2017-08-22 DIAGNOSIS — E119 Type 2 diabetes mellitus without complications: Secondary | ICD-10-CM | POA: Diagnosis not present

## 2017-08-22 DIAGNOSIS — S299XXA Unspecified injury of thorax, initial encounter: Secondary | ICD-10-CM | POA: Diagnosis not present

## 2017-08-22 DIAGNOSIS — S81012A Laceration without foreign body, left knee, initial encounter: Secondary | ICD-10-CM | POA: Diagnosis not present

## 2017-08-22 DIAGNOSIS — M25462 Effusion, left knee: Secondary | ICD-10-CM | POA: Diagnosis not present

## 2017-08-22 DIAGNOSIS — W108XXA Fall (on) (from) other stairs and steps, initial encounter: Secondary | ICD-10-CM | POA: Diagnosis not present

## 2017-08-26 DIAGNOSIS — E871 Hypo-osmolality and hyponatremia: Secondary | ICD-10-CM | POA: Diagnosis not present

## 2017-08-26 DIAGNOSIS — D649 Anemia, unspecified: Secondary | ICD-10-CM | POA: Diagnosis not present

## 2017-08-26 DIAGNOSIS — M6281 Muscle weakness (generalized): Secondary | ICD-10-CM | POA: Diagnosis not present

## 2017-08-26 DIAGNOSIS — E1165 Type 2 diabetes mellitus with hyperglycemia: Secondary | ICD-10-CM | POA: Diagnosis not present

## 2017-08-26 DIAGNOSIS — R296 Repeated falls: Secondary | ICD-10-CM | POA: Diagnosis not present

## 2017-08-26 DIAGNOSIS — I10 Essential (primary) hypertension: Secondary | ICD-10-CM | POA: Diagnosis not present

## 2017-08-26 DIAGNOSIS — R2689 Other abnormalities of gait and mobility: Secondary | ICD-10-CM | POA: Diagnosis not present

## 2017-08-26 DIAGNOSIS — Z4789 Encounter for other orthopedic aftercare: Secondary | ICD-10-CM | POA: Diagnosis not present

## 2017-08-26 DIAGNOSIS — E119 Type 2 diabetes mellitus without complications: Secondary | ICD-10-CM | POA: Diagnosis not present

## 2017-08-26 DIAGNOSIS — S72002D Fracture of unspecified part of neck of left femur, subsequent encounter for closed fracture with routine healing: Secondary | ICD-10-CM | POA: Diagnosis not present

## 2017-08-27 DIAGNOSIS — S72002D Fracture of unspecified part of neck of left femur, subsequent encounter for closed fracture with routine healing: Secondary | ICD-10-CM | POA: Diagnosis not present

## 2017-08-27 DIAGNOSIS — I10 Essential (primary) hypertension: Secondary | ICD-10-CM | POA: Diagnosis not present

## 2017-08-27 DIAGNOSIS — E871 Hypo-osmolality and hyponatremia: Secondary | ICD-10-CM | POA: Diagnosis not present

## 2017-08-27 DIAGNOSIS — D649 Anemia, unspecified: Secondary | ICD-10-CM | POA: Diagnosis not present

## 2017-08-27 DIAGNOSIS — E1165 Type 2 diabetes mellitus with hyperglycemia: Secondary | ICD-10-CM | POA: Diagnosis not present

## 2017-09-07 DIAGNOSIS — S72002D Fracture of unspecified part of neck of left femur, subsequent encounter for closed fracture with routine healing: Secondary | ICD-10-CM | POA: Diagnosis not present

## 2017-09-07 DIAGNOSIS — S72002A Fracture of unspecified part of neck of left femur, initial encounter for closed fracture: Secondary | ICD-10-CM | POA: Insufficient documentation

## 2017-09-13 DIAGNOSIS — D649 Anemia, unspecified: Secondary | ICD-10-CM | POA: Diagnosis not present

## 2017-09-17 DIAGNOSIS — E1122 Type 2 diabetes mellitus with diabetic chronic kidney disease: Secondary | ICD-10-CM | POA: Diagnosis not present

## 2017-09-17 DIAGNOSIS — E1165 Type 2 diabetes mellitus with hyperglycemia: Secondary | ICD-10-CM | POA: Diagnosis not present

## 2017-09-17 DIAGNOSIS — E11319 Type 2 diabetes mellitus with unspecified diabetic retinopathy without macular edema: Secondary | ICD-10-CM | POA: Diagnosis not present

## 2017-09-17 DIAGNOSIS — S72002D Fracture of unspecified part of neck of left femur, subsequent encounter for closed fracture with routine healing: Secondary | ICD-10-CM | POA: Diagnosis not present

## 2017-09-17 DIAGNOSIS — E119 Type 2 diabetes mellitus without complications: Secondary | ICD-10-CM | POA: Diagnosis not present

## 2017-09-17 DIAGNOSIS — N183 Chronic kidney disease, stage 3 (moderate): Secondary | ICD-10-CM | POA: Diagnosis not present

## 2017-09-17 DIAGNOSIS — I10 Essential (primary) hypertension: Secondary | ICD-10-CM | POA: Diagnosis not present

## 2017-09-17 DIAGNOSIS — Z23 Encounter for immunization: Secondary | ICD-10-CM | POA: Diagnosis not present

## 2017-09-17 DIAGNOSIS — W109XXD Fall (on) (from) unspecified stairs and steps, subsequent encounter: Secondary | ICD-10-CM | POA: Diagnosis not present

## 2017-09-18 DIAGNOSIS — W109XXD Fall (on) (from) unspecified stairs and steps, subsequent encounter: Secondary | ICD-10-CM | POA: Diagnosis not present

## 2017-09-18 DIAGNOSIS — I10 Essential (primary) hypertension: Secondary | ICD-10-CM | POA: Diagnosis not present

## 2017-09-18 DIAGNOSIS — E119 Type 2 diabetes mellitus without complications: Secondary | ICD-10-CM | POA: Diagnosis not present

## 2017-09-18 DIAGNOSIS — S72002D Fracture of unspecified part of neck of left femur, subsequent encounter for closed fracture with routine healing: Secondary | ICD-10-CM | POA: Diagnosis not present

## 2017-09-21 DIAGNOSIS — E119 Type 2 diabetes mellitus without complications: Secondary | ICD-10-CM | POA: Diagnosis not present

## 2017-09-21 DIAGNOSIS — I10 Essential (primary) hypertension: Secondary | ICD-10-CM | POA: Diagnosis not present

## 2017-09-21 DIAGNOSIS — W109XXD Fall (on) (from) unspecified stairs and steps, subsequent encounter: Secondary | ICD-10-CM | POA: Diagnosis not present

## 2017-09-21 DIAGNOSIS — S72002D Fracture of unspecified part of neck of left femur, subsequent encounter for closed fracture with routine healing: Secondary | ICD-10-CM | POA: Diagnosis not present

## 2017-09-22 DIAGNOSIS — S72002D Fracture of unspecified part of neck of left femur, subsequent encounter for closed fracture with routine healing: Secondary | ICD-10-CM | POA: Diagnosis not present

## 2017-09-22 DIAGNOSIS — E119 Type 2 diabetes mellitus without complications: Secondary | ICD-10-CM | POA: Diagnosis not present

## 2017-09-22 DIAGNOSIS — W109XXD Fall (on) (from) unspecified stairs and steps, subsequent encounter: Secondary | ICD-10-CM | POA: Diagnosis not present

## 2017-09-22 DIAGNOSIS — I10 Essential (primary) hypertension: Secondary | ICD-10-CM | POA: Diagnosis not present

## 2017-09-23 DIAGNOSIS — S72002D Fracture of unspecified part of neck of left femur, subsequent encounter for closed fracture with routine healing: Secondary | ICD-10-CM | POA: Diagnosis not present

## 2017-09-23 DIAGNOSIS — E119 Type 2 diabetes mellitus without complications: Secondary | ICD-10-CM | POA: Diagnosis not present

## 2017-09-23 DIAGNOSIS — I10 Essential (primary) hypertension: Secondary | ICD-10-CM | POA: Diagnosis not present

## 2017-09-23 DIAGNOSIS — W109XXD Fall (on) (from) unspecified stairs and steps, subsequent encounter: Secondary | ICD-10-CM | POA: Diagnosis not present

## 2017-09-28 DIAGNOSIS — E119 Type 2 diabetes mellitus without complications: Secondary | ICD-10-CM | POA: Diagnosis not present

## 2017-09-28 DIAGNOSIS — W109XXD Fall (on) (from) unspecified stairs and steps, subsequent encounter: Secondary | ICD-10-CM | POA: Diagnosis not present

## 2017-09-28 DIAGNOSIS — S72002D Fracture of unspecified part of neck of left femur, subsequent encounter for closed fracture with routine healing: Secondary | ICD-10-CM | POA: Diagnosis not present

## 2017-09-28 DIAGNOSIS — I10 Essential (primary) hypertension: Secondary | ICD-10-CM | POA: Diagnosis not present

## 2017-09-30 DIAGNOSIS — S72002D Fracture of unspecified part of neck of left femur, subsequent encounter for closed fracture with routine healing: Secondary | ICD-10-CM | POA: Diagnosis not present

## 2017-09-30 DIAGNOSIS — I10 Essential (primary) hypertension: Secondary | ICD-10-CM | POA: Diagnosis not present

## 2017-09-30 DIAGNOSIS — W109XXD Fall (on) (from) unspecified stairs and steps, subsequent encounter: Secondary | ICD-10-CM | POA: Diagnosis not present

## 2017-09-30 DIAGNOSIS — E119 Type 2 diabetes mellitus without complications: Secondary | ICD-10-CM | POA: Diagnosis not present

## 2017-10-01 DIAGNOSIS — E119 Type 2 diabetes mellitus without complications: Secondary | ICD-10-CM | POA: Diagnosis not present

## 2017-10-01 DIAGNOSIS — S72002D Fracture of unspecified part of neck of left femur, subsequent encounter for closed fracture with routine healing: Secondary | ICD-10-CM | POA: Diagnosis not present

## 2017-10-01 DIAGNOSIS — I10 Essential (primary) hypertension: Secondary | ICD-10-CM | POA: Diagnosis not present

## 2017-10-01 DIAGNOSIS — W109XXD Fall (on) (from) unspecified stairs and steps, subsequent encounter: Secondary | ICD-10-CM | POA: Diagnosis not present

## 2017-10-05 DIAGNOSIS — W109XXD Fall (on) (from) unspecified stairs and steps, subsequent encounter: Secondary | ICD-10-CM | POA: Diagnosis not present

## 2017-10-05 DIAGNOSIS — E119 Type 2 diabetes mellitus without complications: Secondary | ICD-10-CM | POA: Diagnosis not present

## 2017-10-05 DIAGNOSIS — S72002D Fracture of unspecified part of neck of left femur, subsequent encounter for closed fracture with routine healing: Secondary | ICD-10-CM | POA: Diagnosis not present

## 2017-10-05 DIAGNOSIS — I10 Essential (primary) hypertension: Secondary | ICD-10-CM | POA: Diagnosis not present

## 2017-10-08 DIAGNOSIS — E119 Type 2 diabetes mellitus without complications: Secondary | ICD-10-CM | POA: Diagnosis not present

## 2017-10-08 DIAGNOSIS — W109XXD Fall (on) (from) unspecified stairs and steps, subsequent encounter: Secondary | ICD-10-CM | POA: Diagnosis not present

## 2017-10-08 DIAGNOSIS — I10 Essential (primary) hypertension: Secondary | ICD-10-CM | POA: Diagnosis not present

## 2017-10-08 DIAGNOSIS — S72002D Fracture of unspecified part of neck of left femur, subsequent encounter for closed fracture with routine healing: Secondary | ICD-10-CM | POA: Diagnosis not present

## 2017-10-12 DIAGNOSIS — S72002D Fracture of unspecified part of neck of left femur, subsequent encounter for closed fracture with routine healing: Secondary | ICD-10-CM | POA: Diagnosis not present

## 2017-10-12 DIAGNOSIS — W109XXD Fall (on) (from) unspecified stairs and steps, subsequent encounter: Secondary | ICD-10-CM | POA: Diagnosis not present

## 2017-10-12 DIAGNOSIS — E119 Type 2 diabetes mellitus without complications: Secondary | ICD-10-CM | POA: Diagnosis not present

## 2017-10-12 DIAGNOSIS — I10 Essential (primary) hypertension: Secondary | ICD-10-CM | POA: Diagnosis not present

## 2017-10-15 DIAGNOSIS — W109XXD Fall (on) (from) unspecified stairs and steps, subsequent encounter: Secondary | ICD-10-CM | POA: Diagnosis not present

## 2017-10-15 DIAGNOSIS — E119 Type 2 diabetes mellitus without complications: Secondary | ICD-10-CM | POA: Diagnosis not present

## 2017-10-15 DIAGNOSIS — I10 Essential (primary) hypertension: Secondary | ICD-10-CM | POA: Diagnosis not present

## 2017-10-15 DIAGNOSIS — S72002D Fracture of unspecified part of neck of left femur, subsequent encounter for closed fracture with routine healing: Secondary | ICD-10-CM | POA: Diagnosis not present

## 2017-10-21 DIAGNOSIS — E119 Type 2 diabetes mellitus without complications: Secondary | ICD-10-CM | POA: Diagnosis not present

## 2017-10-21 DIAGNOSIS — W109XXD Fall (on) (from) unspecified stairs and steps, subsequent encounter: Secondary | ICD-10-CM | POA: Diagnosis not present

## 2017-10-21 DIAGNOSIS — I10 Essential (primary) hypertension: Secondary | ICD-10-CM | POA: Diagnosis not present

## 2017-10-21 DIAGNOSIS — S72002D Fracture of unspecified part of neck of left femur, subsequent encounter for closed fracture with routine healing: Secondary | ICD-10-CM | POA: Diagnosis not present

## 2017-10-26 DIAGNOSIS — E1122 Type 2 diabetes mellitus with diabetic chronic kidney disease: Secondary | ICD-10-CM | POA: Diagnosis not present

## 2017-10-26 DIAGNOSIS — E7800 Pure hypercholesterolemia, unspecified: Secondary | ICD-10-CM | POA: Diagnosis not present

## 2017-10-26 DIAGNOSIS — E11319 Type 2 diabetes mellitus with unspecified diabetic retinopathy without macular edema: Secondary | ICD-10-CM | POA: Diagnosis not present

## 2017-10-26 DIAGNOSIS — E782 Mixed hyperlipidemia: Secondary | ICD-10-CM | POA: Diagnosis not present

## 2017-10-26 DIAGNOSIS — E1165 Type 2 diabetes mellitus with hyperglycemia: Secondary | ICD-10-CM | POA: Diagnosis not present

## 2017-10-28 DIAGNOSIS — E11319 Type 2 diabetes mellitus with unspecified diabetic retinopathy without macular edema: Secondary | ICD-10-CM | POA: Diagnosis not present

## 2017-10-28 DIAGNOSIS — I10 Essential (primary) hypertension: Secondary | ICD-10-CM | POA: Diagnosis not present

## 2017-10-28 DIAGNOSIS — E1122 Type 2 diabetes mellitus with diabetic chronic kidney disease: Secondary | ICD-10-CM | POA: Diagnosis not present

## 2017-10-28 DIAGNOSIS — Z1389 Encounter for screening for other disorder: Secondary | ICD-10-CM | POA: Diagnosis not present

## 2017-10-28 DIAGNOSIS — E782 Mixed hyperlipidemia: Secondary | ICD-10-CM | POA: Diagnosis not present

## 2017-10-28 DIAGNOSIS — E1165 Type 2 diabetes mellitus with hyperglycemia: Secondary | ICD-10-CM | POA: Diagnosis not present

## 2018-02-22 DIAGNOSIS — E1122 Type 2 diabetes mellitus with diabetic chronic kidney disease: Secondary | ICD-10-CM | POA: Diagnosis not present

## 2018-02-22 DIAGNOSIS — R5383 Other fatigue: Secondary | ICD-10-CM | POA: Diagnosis not present

## 2018-02-22 DIAGNOSIS — E782 Mixed hyperlipidemia: Secondary | ICD-10-CM | POA: Diagnosis not present

## 2018-02-22 DIAGNOSIS — E7800 Pure hypercholesterolemia, unspecified: Secondary | ICD-10-CM | POA: Diagnosis not present

## 2018-02-22 DIAGNOSIS — E11319 Type 2 diabetes mellitus with unspecified diabetic retinopathy without macular edema: Secondary | ICD-10-CM | POA: Diagnosis not present

## 2018-02-22 DIAGNOSIS — E1165 Type 2 diabetes mellitus with hyperglycemia: Secondary | ICD-10-CM | POA: Diagnosis not present

## 2018-02-25 DIAGNOSIS — E1122 Type 2 diabetes mellitus with diabetic chronic kidney disease: Secondary | ICD-10-CM | POA: Diagnosis not present

## 2018-02-25 DIAGNOSIS — E782 Mixed hyperlipidemia: Secondary | ICD-10-CM | POA: Diagnosis not present

## 2018-02-25 DIAGNOSIS — Z0001 Encounter for general adult medical examination with abnormal findings: Secondary | ICD-10-CM | POA: Diagnosis not present

## 2018-02-25 DIAGNOSIS — E11319 Type 2 diabetes mellitus with unspecified diabetic retinopathy without macular edema: Secondary | ICD-10-CM | POA: Diagnosis not present

## 2018-02-25 DIAGNOSIS — E1165 Type 2 diabetes mellitus with hyperglycemia: Secondary | ICD-10-CM | POA: Diagnosis not present

## 2018-05-17 DIAGNOSIS — M545 Low back pain: Secondary | ICD-10-CM | POA: Diagnosis not present

## 2018-05-17 DIAGNOSIS — M1611 Unilateral primary osteoarthritis, right hip: Secondary | ICD-10-CM | POA: Diagnosis not present

## 2018-05-17 DIAGNOSIS — M47815 Spondylosis without myelopathy or radiculopathy, thoracolumbar region: Secondary | ICD-10-CM | POA: Diagnosis not present

## 2018-05-17 DIAGNOSIS — Z6823 Body mass index (BMI) 23.0-23.9, adult: Secondary | ICD-10-CM | POA: Diagnosis not present

## 2018-06-03 DIAGNOSIS — M25561 Pain in right knee: Secondary | ICD-10-CM | POA: Diagnosis not present

## 2018-06-03 DIAGNOSIS — M25551 Pain in right hip: Secondary | ICD-10-CM | POA: Diagnosis not present

## 2018-06-03 DIAGNOSIS — M25562 Pain in left knee: Secondary | ICD-10-CM | POA: Diagnosis not present

## 2018-06-03 DIAGNOSIS — N3001 Acute cystitis with hematuria: Secondary | ICD-10-CM | POA: Diagnosis not present

## 2018-06-03 DIAGNOSIS — Z6823 Body mass index (BMI) 23.0-23.9, adult: Secondary | ICD-10-CM | POA: Diagnosis not present

## 2018-06-24 DIAGNOSIS — E7800 Pure hypercholesterolemia, unspecified: Secondary | ICD-10-CM | POA: Diagnosis not present

## 2018-06-24 DIAGNOSIS — E1165 Type 2 diabetes mellitus with hyperglycemia: Secondary | ICD-10-CM | POA: Diagnosis not present

## 2018-06-24 DIAGNOSIS — E782 Mixed hyperlipidemia: Secondary | ICD-10-CM | POA: Diagnosis not present

## 2018-06-24 DIAGNOSIS — E871 Hypo-osmolality and hyponatremia: Secondary | ICD-10-CM | POA: Diagnosis not present

## 2018-06-24 DIAGNOSIS — K219 Gastro-esophageal reflux disease without esophagitis: Secondary | ICD-10-CM | POA: Diagnosis not present

## 2018-06-28 DIAGNOSIS — E11319 Type 2 diabetes mellitus with unspecified diabetic retinopathy without macular edema: Secondary | ICD-10-CM | POA: Diagnosis not present

## 2018-06-28 DIAGNOSIS — E1165 Type 2 diabetes mellitus with hyperglycemia: Secondary | ICD-10-CM | POA: Diagnosis not present

## 2018-06-28 DIAGNOSIS — E782 Mixed hyperlipidemia: Secondary | ICD-10-CM | POA: Diagnosis not present

## 2018-06-28 DIAGNOSIS — I10 Essential (primary) hypertension: Secondary | ICD-10-CM | POA: Diagnosis not present

## 2018-06-28 DIAGNOSIS — E1122 Type 2 diabetes mellitus with diabetic chronic kidney disease: Secondary | ICD-10-CM | POA: Diagnosis not present

## 2018-07-21 DIAGNOSIS — M170 Bilateral primary osteoarthritis of knee: Secondary | ICD-10-CM | POA: Diagnosis not present

## 2018-07-21 DIAGNOSIS — M47816 Spondylosis without myelopathy or radiculopathy, lumbar region: Secondary | ICD-10-CM | POA: Insufficient documentation

## 2018-07-21 DIAGNOSIS — M25561 Pain in right knee: Secondary | ICD-10-CM | POA: Diagnosis not present

## 2018-07-21 DIAGNOSIS — M160 Bilateral primary osteoarthritis of hip: Secondary | ICD-10-CM | POA: Insufficient documentation

## 2018-07-21 DIAGNOSIS — M5410 Radiculopathy, site unspecified: Secondary | ICD-10-CM | POA: Diagnosis not present

## 2018-07-27 DIAGNOSIS — M5410 Radiculopathy, site unspecified: Secondary | ICD-10-CM | POA: Diagnosis not present

## 2018-07-27 DIAGNOSIS — M5126 Other intervertebral disc displacement, lumbar region: Secondary | ICD-10-CM | POA: Diagnosis not present

## 2018-07-27 DIAGNOSIS — M47816 Spondylosis without myelopathy or radiculopathy, lumbar region: Secondary | ICD-10-CM | POA: Diagnosis not present

## 2018-07-27 DIAGNOSIS — M48061 Spinal stenosis, lumbar region without neurogenic claudication: Secondary | ICD-10-CM | POA: Diagnosis not present

## 2018-08-03 ENCOUNTER — Other Ambulatory Visit: Payer: Self-pay

## 2018-08-03 NOTE — Patient Outreach (Signed)
[facility]) Care Management  08/03/2018  Wanda Shields [birth year] [id number]   Medication Adherence call to Mrs. Wanda Shields spoke with patient she already order Lisinopril 10 mg from [facility] she will received a mail order in the next couple of days. Wanda Shields is showing past due under [place].  [place] Management Direct Dial [phone]  Fax [phone] [email or web address]

## 2018-08-11 DIAGNOSIS — S72002D Fracture of unspecified part of neck of left femur, subsequent encounter for closed fracture with routine healing: Secondary | ICD-10-CM | POA: Diagnosis not present

## 2018-08-11 DIAGNOSIS — G5601 Carpal tunnel syndrome, right upper limb: Secondary | ICD-10-CM | POA: Diagnosis not present

## 2018-08-11 DIAGNOSIS — E871 Hypo-osmolality and hyponatremia: Secondary | ICD-10-CM | POA: Diagnosis not present

## 2018-08-11 DIAGNOSIS — N183 Chronic kidney disease, stage 3 (moderate): Secondary | ICD-10-CM | POA: Diagnosis not present

## 2018-08-11 DIAGNOSIS — I10 Essential (primary) hypertension: Secondary | ICD-10-CM | POA: Diagnosis not present

## 2018-08-25 DIAGNOSIS — M4316 Spondylolisthesis, lumbar region: Secondary | ICD-10-CM | POA: Diagnosis not present

## 2018-08-25 DIAGNOSIS — M859 Disorder of bone density and structure, unspecified: Secondary | ICD-10-CM | POA: Diagnosis not present

## 2018-08-25 DIAGNOSIS — M48062 Spinal stenosis, lumbar region with neurogenic claudication: Secondary | ICD-10-CM | POA: Diagnosis not present

## 2018-08-25 DIAGNOSIS — M4716 Other spondylosis with myelopathy, lumbar region: Secondary | ICD-10-CM | POA: Diagnosis not present

## 2018-08-26 ENCOUNTER — Other Ambulatory Visit (HOSPITAL_COMMUNITY): Payer: Self-pay | Admitting: Neurosurgery

## 2018-08-26 DIAGNOSIS — E2839 Other primary ovarian failure: Secondary | ICD-10-CM

## 2018-08-26 DIAGNOSIS — M4316 Spondylolisthesis, lumbar region: Secondary | ICD-10-CM

## 2018-09-01 ENCOUNTER — Ambulatory Visit (HOSPITAL_COMMUNITY)
Admission: RE | Admit: 2018-09-01 | Discharge: 2018-09-01 | Disposition: A | Discharge status: Home / Self Care | Payer: Medicare Other | Source: Ambulatory Visit | Attending: Neurosurgery | Admitting: Neurosurgery

## 2018-09-01 DIAGNOSIS — M47816 Spondylosis without myelopathy or radiculopathy, lumbar region: Secondary | ICD-10-CM | POA: Diagnosis not present

## 2018-09-01 DIAGNOSIS — M5135 Other intervertebral disc degeneration, thoracolumbar region: Secondary | ICD-10-CM | POA: Diagnosis not present

## 2018-09-01 DIAGNOSIS — M8588 Other specified disorders of bone density and structure, other site: Secondary | ICD-10-CM | POA: Insufficient documentation

## 2018-09-01 DIAGNOSIS — M4185 Other forms of scoliosis, thoracolumbar region: Secondary | ICD-10-CM | POA: Diagnosis not present

## 2018-09-01 DIAGNOSIS — M4316 Spondylolisthesis, lumbar region: Secondary | ICD-10-CM

## 2018-09-01 DIAGNOSIS — E2839 Other primary ovarian failure: Secondary | ICD-10-CM

## 2018-09-01 DIAGNOSIS — M810 Age-related osteoporosis without current pathological fracture: Secondary | ICD-10-CM | POA: Diagnosis not present

## 2018-09-17 DIAGNOSIS — M4716 Other spondylosis with myelopathy, lumbar region: Secondary | ICD-10-CM | POA: Diagnosis not present

## 2018-09-17 DIAGNOSIS — M48062 Spinal stenosis, lumbar region with neurogenic claudication: Secondary | ICD-10-CM | POA: Diagnosis not present

## 2018-09-17 DIAGNOSIS — M4316 Spondylolisthesis, lumbar region: Secondary | ICD-10-CM | POA: Diagnosis not present

## 2018-10-14 DIAGNOSIS — Z01812 Encounter for preprocedural laboratory examination: Secondary | ICD-10-CM | POA: Diagnosis not present

## 2018-10-14 DIAGNOSIS — Z01810 Encounter for preprocedural cardiovascular examination: Secondary | ICD-10-CM | POA: Diagnosis not present

## 2018-10-14 DIAGNOSIS — Z01811 Encounter for preprocedural respiratory examination: Secondary | ICD-10-CM | POA: Diagnosis not present

## 2018-10-14 DIAGNOSIS — M4316 Spondylolisthesis, lumbar region: Secondary | ICD-10-CM | POA: Diagnosis not present

## 2018-10-14 DIAGNOSIS — Z8679 Personal history of other diseases of the circulatory system: Secondary | ICD-10-CM | POA: Diagnosis not present

## 2018-10-18 DIAGNOSIS — M48061 Spinal stenosis, lumbar region without neurogenic claudication: Secondary | ICD-10-CM | POA: Diagnosis not present

## 2018-10-18 DIAGNOSIS — M4316 Spondylolisthesis, lumbar region: Secondary | ICD-10-CM | POA: Diagnosis not present

## 2018-10-18 DIAGNOSIS — N183 Chronic kidney disease, stage 3 (moderate): Secondary | ICD-10-CM | POA: Diagnosis not present

## 2018-10-18 DIAGNOSIS — Z23 Encounter for immunization: Secondary | ICD-10-CM | POA: Diagnosis not present

## 2018-10-18 DIAGNOSIS — Z7952 Long term (current) use of systemic steroids: Secondary | ICD-10-CM | POA: Diagnosis not present

## 2018-10-18 DIAGNOSIS — M48062 Spinal stenosis, lumbar region with neurogenic claudication: Secondary | ICD-10-CM | POA: Diagnosis not present

## 2018-10-18 DIAGNOSIS — M47896 Other spondylosis, lumbar region: Secondary | ICD-10-CM | POA: Diagnosis not present

## 2018-10-18 DIAGNOSIS — R2689 Other abnormalities of gait and mobility: Secondary | ICD-10-CM | POA: Diagnosis not present

## 2018-10-18 DIAGNOSIS — E785 Hyperlipidemia, unspecified: Secondary | ICD-10-CM | POA: Diagnosis not present

## 2018-10-18 DIAGNOSIS — M4716 Other spondylosis with myelopathy, lumbar region: Secondary | ICD-10-CM | POA: Diagnosis not present

## 2018-10-18 DIAGNOSIS — M6281 Muscle weakness (generalized): Secondary | ICD-10-CM | POA: Diagnosis not present

## 2018-10-18 DIAGNOSIS — M47816 Spondylosis without myelopathy or radiculopathy, lumbar region: Secondary | ICD-10-CM | POA: Diagnosis not present

## 2018-10-18 DIAGNOSIS — Z4789 Encounter for other orthopedic aftercare: Secondary | ICD-10-CM | POA: Diagnosis not present

## 2018-10-18 DIAGNOSIS — Z79899 Other long term (current) drug therapy: Secondary | ICD-10-CM | POA: Diagnosis not present

## 2018-10-18 DIAGNOSIS — M810 Age-related osteoporosis without current pathological fracture: Secondary | ICD-10-CM | POA: Diagnosis not present

## 2018-10-18 DIAGNOSIS — I10 Essential (primary) hypertension: Secondary | ICD-10-CM | POA: Diagnosis not present

## 2018-10-18 DIAGNOSIS — M4326 Fusion of spine, lumbar region: Secondary | ICD-10-CM | POA: Diagnosis not present

## 2018-10-18 DIAGNOSIS — E119 Type 2 diabetes mellitus without complications: Secondary | ICD-10-CM | POA: Diagnosis not present

## 2018-10-18 DIAGNOSIS — I129 Hypertensive chronic kidney disease with stage 1 through stage 4 chronic kidney disease, or unspecified chronic kidney disease: Secondary | ICD-10-CM | POA: Diagnosis not present

## 2018-10-18 DIAGNOSIS — Z7984 Long term (current) use of oral hypoglycemic drugs: Secondary | ICD-10-CM | POA: Diagnosis not present

## 2018-10-22 DIAGNOSIS — E785 Hyperlipidemia, unspecified: Secondary | ICD-10-CM | POA: Diagnosis not present

## 2018-10-22 DIAGNOSIS — E7800 Pure hypercholesterolemia, unspecified: Secondary | ICD-10-CM | POA: Diagnosis not present

## 2018-10-22 DIAGNOSIS — M48062 Spinal stenosis, lumbar region with neurogenic claudication: Secondary | ICD-10-CM | POA: Diagnosis not present

## 2018-10-22 DIAGNOSIS — Z4789 Encounter for other orthopedic aftercare: Secondary | ICD-10-CM | POA: Diagnosis not present

## 2018-10-22 DIAGNOSIS — M47896 Other spondylosis, lumbar region: Secondary | ICD-10-CM | POA: Diagnosis not present

## 2018-10-22 DIAGNOSIS — M6281 Muscle weakness (generalized): Secondary | ICD-10-CM | POA: Diagnosis not present

## 2018-10-22 DIAGNOSIS — E1165 Type 2 diabetes mellitus with hyperglycemia: Secondary | ICD-10-CM | POA: Diagnosis not present

## 2018-10-22 DIAGNOSIS — M4316 Spondylolisthesis, lumbar region: Secondary | ICD-10-CM | POA: Diagnosis not present

## 2018-10-22 DIAGNOSIS — Z23 Encounter for immunization: Secondary | ICD-10-CM | POA: Diagnosis not present

## 2018-10-22 DIAGNOSIS — I129 Hypertensive chronic kidney disease with stage 1 through stage 4 chronic kidney disease, or unspecified chronic kidney disease: Secondary | ICD-10-CM | POA: Diagnosis not present

## 2018-10-22 DIAGNOSIS — R2689 Other abnormalities of gait and mobility: Secondary | ICD-10-CM | POA: Diagnosis not present

## 2018-10-22 DIAGNOSIS — E119 Type 2 diabetes mellitus without complications: Secondary | ICD-10-CM | POA: Diagnosis not present

## 2018-10-22 DIAGNOSIS — I10 Essential (primary) hypertension: Secondary | ICD-10-CM | POA: Diagnosis not present

## 2018-10-22 DIAGNOSIS — N183 Chronic kidney disease, stage 3 (moderate): Secondary | ICD-10-CM | POA: Diagnosis not present

## 2018-10-23 DIAGNOSIS — M4316 Spondylolisthesis, lumbar region: Secondary | ICD-10-CM | POA: Diagnosis not present

## 2018-10-23 DIAGNOSIS — I10 Essential (primary) hypertension: Secondary | ICD-10-CM | POA: Diagnosis not present

## 2018-10-23 DIAGNOSIS — E1165 Type 2 diabetes mellitus with hyperglycemia: Secondary | ICD-10-CM | POA: Diagnosis not present

## 2018-11-01 ENCOUNTER — Other Ambulatory Visit: Payer: Self-pay

## 2018-11-01 NOTE — Patient Outreach (Signed)
[facility]) Care Management  11/01/2018  ZERAH HILYER 01/01/1930 [id number]   Medication Adherence call to Mrs. Patsy Lager per patient's husband she is at a long term Nursing Home, patient is due on Lisinopril 10 mg and Metformin 850 mg under [place].   [place] Management Direct Dial [phone]  Fax [phone] [email or web address]

## 2018-11-07 DIAGNOSIS — E7800 Pure hypercholesterolemia, unspecified: Secondary | ICD-10-CM | POA: Diagnosis not present

## 2018-11-07 DIAGNOSIS — E1165 Type 2 diabetes mellitus with hyperglycemia: Secondary | ICD-10-CM | POA: Diagnosis not present

## 2018-11-07 DIAGNOSIS — M4316 Spondylolisthesis, lumbar region: Secondary | ICD-10-CM | POA: Diagnosis not present

## 2018-11-07 DIAGNOSIS — I10 Essential (primary) hypertension: Secondary | ICD-10-CM | POA: Diagnosis not present

## 2018-11-09 DIAGNOSIS — M48062 Spinal stenosis, lumbar region with neurogenic claudication: Secondary | ICD-10-CM | POA: Diagnosis not present

## 2018-11-09 DIAGNOSIS — M6281 Muscle weakness (generalized): Secondary | ICD-10-CM | POA: Diagnosis not present

## 2018-11-09 DIAGNOSIS — Z4789 Encounter for other orthopedic aftercare: Secondary | ICD-10-CM | POA: Diagnosis not present

## 2018-11-09 DIAGNOSIS — M47896 Other spondylosis, lumbar region: Secondary | ICD-10-CM | POA: Diagnosis not present

## 2018-11-12 DIAGNOSIS — M6281 Muscle weakness (generalized): Secondary | ICD-10-CM | POA: Diagnosis not present

## 2018-11-12 DIAGNOSIS — M48062 Spinal stenosis, lumbar region with neurogenic claudication: Secondary | ICD-10-CM | POA: Diagnosis not present

## 2018-11-12 DIAGNOSIS — M47896 Other spondylosis, lumbar region: Secondary | ICD-10-CM | POA: Diagnosis not present

## 2018-11-12 DIAGNOSIS — Z4789 Encounter for other orthopedic aftercare: Secondary | ICD-10-CM | POA: Diagnosis not present

## 2018-11-15 ENCOUNTER — Other Ambulatory Visit: Payer: Self-pay

## 2018-11-15 DIAGNOSIS — M4310 Spondylolisthesis, site unspecified: Secondary | ICD-10-CM | POA: Diagnosis not present

## 2018-11-15 DIAGNOSIS — M4316 Spondylolisthesis, lumbar region: Secondary | ICD-10-CM | POA: Insufficient documentation

## 2018-11-15 DIAGNOSIS — M5136 Other intervertebral disc degeneration, lumbar region: Secondary | ICD-10-CM | POA: Diagnosis not present

## 2018-11-15 NOTE — Patient Outreach (Signed)
[facility]) Care Management  11/15/2018  Wanda Shields May 15, 1930 [id number]   EMMI: general discharge RED alert Referral date: 11/15/18 Referral reason: Lost interest in thing? YES Insurance: united health care  Day # 4  Telephone call to patient regarding EMMI general discharge red alert.  HIPAA verified. Explained reason for call. Patient denies having a lost of interest in things. Patient states I have a cold that is making me not feel very good.  Patient states she has a cough and chest congestion. Patient states her doctors are aware of this. Patient states she saw her surgeon this morning. Patient reports having back surgery and being discharged from [facility] rehab recently. Patient states the doctor said her incision is healing well and she is coming along really well. Patient states her pain level is around 7 with 10 being the worse. Patient states she thinks this is from the car ride this morning. Patient states she has percocet for pain and she has taken her pain medication.  Patient states someone drives her to her appointments.  Patient states she is scheduled to follow up with the surgeon in a month. Patient states she is scheduled to see her primary MD this afternoon for a follow up visit and regarding the cold symptoms she is having. Patient states she is going to outpatient rehab 2 times per week.  RNCM discussed symptoms of infection with patient. Patient advised to call her doctor for these symptoms. Patient verbalized understanding.  Patient denies any further needs or concerns.  RNCM advised patient to notify MD of any changes in condition prior to scheduled appointment. RNCM offered to provide patient with contact phone number for 24 hour nurse advise line. Patient requested the information be mailed to her.   RNCM verified patient aware of 911 services for urgent/ emergent needs.,  PLAN: RNCM will close patient due to patient being assessed and having  no further needs.  RNCM will send patient [facility] care management brochure/ magnet as discussed .   Quinn Plowman RN,BSN,CCM [facility] Telephonic  [phone]

## 2018-11-17 DIAGNOSIS — Z4789 Encounter for other orthopedic aftercare: Secondary | ICD-10-CM | POA: Diagnosis not present

## 2018-11-17 DIAGNOSIS — M48062 Spinal stenosis, lumbar region with neurogenic claudication: Secondary | ICD-10-CM | POA: Diagnosis not present

## 2018-11-17 DIAGNOSIS — M47896 Other spondylosis, lumbar region: Secondary | ICD-10-CM | POA: Diagnosis not present

## 2018-11-17 DIAGNOSIS — M6281 Muscle weakness (generalized): Secondary | ICD-10-CM | POA: Diagnosis not present

## 2018-11-19 DIAGNOSIS — M6281 Muscle weakness (generalized): Secondary | ICD-10-CM | POA: Diagnosis not present

## 2018-11-19 DIAGNOSIS — M48062 Spinal stenosis, lumbar region with neurogenic claudication: Secondary | ICD-10-CM | POA: Diagnosis not present

## 2018-11-19 DIAGNOSIS — M47896 Other spondylosis, lumbar region: Secondary | ICD-10-CM | POA: Diagnosis not present

## 2018-11-19 DIAGNOSIS — Z4789 Encounter for other orthopedic aftercare: Secondary | ICD-10-CM | POA: Diagnosis not present

## 2018-11-22 DIAGNOSIS — M48062 Spinal stenosis, lumbar region with neurogenic claudication: Secondary | ICD-10-CM | POA: Diagnosis not present

## 2018-11-22 DIAGNOSIS — M47896 Other spondylosis, lumbar region: Secondary | ICD-10-CM | POA: Diagnosis not present

## 2018-11-22 DIAGNOSIS — Z4789 Encounter for other orthopedic aftercare: Secondary | ICD-10-CM | POA: Diagnosis not present

## 2018-11-22 DIAGNOSIS — M6281 Muscle weakness (generalized): Secondary | ICD-10-CM | POA: Diagnosis not present

## 2018-11-26 DIAGNOSIS — M48062 Spinal stenosis, lumbar region with neurogenic claudication: Secondary | ICD-10-CM | POA: Diagnosis not present

## 2018-11-26 DIAGNOSIS — Z4789 Encounter for other orthopedic aftercare: Secondary | ICD-10-CM | POA: Diagnosis not present

## 2018-11-26 DIAGNOSIS — M47896 Other spondylosis, lumbar region: Secondary | ICD-10-CM | POA: Diagnosis not present

## 2018-11-26 DIAGNOSIS — M6281 Muscle weakness (generalized): Secondary | ICD-10-CM | POA: Diagnosis not present

## 2018-11-29 DIAGNOSIS — Z4789 Encounter for other orthopedic aftercare: Secondary | ICD-10-CM | POA: Diagnosis not present

## 2018-11-29 DIAGNOSIS — M48062 Spinal stenosis, lumbar region with neurogenic claudication: Secondary | ICD-10-CM | POA: Diagnosis not present

## 2018-11-29 DIAGNOSIS — M6281 Muscle weakness (generalized): Secondary | ICD-10-CM | POA: Diagnosis not present

## 2018-11-29 DIAGNOSIS — M47896 Other spondylosis, lumbar region: Secondary | ICD-10-CM | POA: Diagnosis not present

## 2018-12-02 DIAGNOSIS — Z4789 Encounter for other orthopedic aftercare: Secondary | ICD-10-CM | POA: Diagnosis not present

## 2018-12-02 DIAGNOSIS — M6281 Muscle weakness (generalized): Secondary | ICD-10-CM | POA: Diagnosis not present

## 2018-12-02 DIAGNOSIS — M47896 Other spondylosis, lumbar region: Secondary | ICD-10-CM | POA: Diagnosis not present

## 2018-12-02 DIAGNOSIS — M48062 Spinal stenosis, lumbar region with neurogenic claudication: Secondary | ICD-10-CM | POA: Diagnosis not present

## 2018-12-06 DIAGNOSIS — M48062 Spinal stenosis, lumbar region with neurogenic claudication: Secondary | ICD-10-CM | POA: Diagnosis not present

## 2018-12-06 DIAGNOSIS — M47896 Other spondylosis, lumbar region: Secondary | ICD-10-CM | POA: Diagnosis not present

## 2018-12-06 DIAGNOSIS — M6281 Muscle weakness (generalized): Secondary | ICD-10-CM | POA: Diagnosis not present

## 2018-12-06 DIAGNOSIS — Z4789 Encounter for other orthopedic aftercare: Secondary | ICD-10-CM | POA: Diagnosis not present

## 2018-12-08 DIAGNOSIS — J209 Acute bronchitis, unspecified: Secondary | ICD-10-CM | POA: Diagnosis not present

## 2018-12-08 DIAGNOSIS — I10 Essential (primary) hypertension: Secondary | ICD-10-CM | POA: Diagnosis not present

## 2018-12-08 DIAGNOSIS — E1165 Type 2 diabetes mellitus with hyperglycemia: Secondary | ICD-10-CM | POA: Diagnosis not present

## 2018-12-08 DIAGNOSIS — N183 Chronic kidney disease, stage 3 (moderate): Secondary | ICD-10-CM | POA: Diagnosis not present

## 2018-12-08 DIAGNOSIS — M545 Low back pain: Secondary | ICD-10-CM | POA: Diagnosis not present

## 2018-12-10 DIAGNOSIS — M6281 Muscle weakness (generalized): Secondary | ICD-10-CM | POA: Diagnosis not present

## 2018-12-10 DIAGNOSIS — M48062 Spinal stenosis, lumbar region with neurogenic claudication: Secondary | ICD-10-CM | POA: Diagnosis not present

## 2018-12-10 DIAGNOSIS — Z4789 Encounter for other orthopedic aftercare: Secondary | ICD-10-CM | POA: Diagnosis not present

## 2018-12-10 DIAGNOSIS — M47896 Other spondylosis, lumbar region: Secondary | ICD-10-CM | POA: Diagnosis not present

## 2018-12-14 DIAGNOSIS — M47896 Other spondylosis, lumbar region: Secondary | ICD-10-CM | POA: Diagnosis not present

## 2018-12-14 DIAGNOSIS — M6281 Muscle weakness (generalized): Secondary | ICD-10-CM | POA: Diagnosis not present

## 2018-12-14 DIAGNOSIS — M48062 Spinal stenosis, lumbar region with neurogenic claudication: Secondary | ICD-10-CM | POA: Diagnosis not present

## 2018-12-14 DIAGNOSIS — Z4789 Encounter for other orthopedic aftercare: Secondary | ICD-10-CM | POA: Diagnosis not present

## 2018-12-22 DIAGNOSIS — E1122 Type 2 diabetes mellitus with diabetic chronic kidney disease: Secondary | ICD-10-CM | POA: Diagnosis not present

## 2018-12-22 DIAGNOSIS — N183 Chronic kidney disease, stage 3 (moderate): Secondary | ICD-10-CM | POA: Diagnosis not present

## 2018-12-22 DIAGNOSIS — R5383 Other fatigue: Secondary | ICD-10-CM | POA: Diagnosis not present

## 2018-12-22 DIAGNOSIS — E782 Mixed hyperlipidemia: Secondary | ICD-10-CM | POA: Diagnosis not present

## 2018-12-24 DIAGNOSIS — E1122 Type 2 diabetes mellitus with diabetic chronic kidney disease: Secondary | ICD-10-CM | POA: Diagnosis not present

## 2018-12-24 DIAGNOSIS — I10 Essential (primary) hypertension: Secondary | ICD-10-CM | POA: Diagnosis not present

## 2018-12-24 DIAGNOSIS — E11319 Type 2 diabetes mellitus with unspecified diabetic retinopathy without macular edema: Secondary | ICD-10-CM | POA: Diagnosis not present

## 2018-12-24 DIAGNOSIS — E1165 Type 2 diabetes mellitus with hyperglycemia: Secondary | ICD-10-CM | POA: Diagnosis not present

## 2018-12-24 DIAGNOSIS — E782 Mixed hyperlipidemia: Secondary | ICD-10-CM | POA: Diagnosis not present

## 2019-04-14 DIAGNOSIS — K219 Gastro-esophageal reflux disease without esophagitis: Secondary | ICD-10-CM | POA: Diagnosis not present

## 2019-04-14 DIAGNOSIS — E782 Mixed hyperlipidemia: Secondary | ICD-10-CM | POA: Diagnosis not present

## 2019-04-14 DIAGNOSIS — I10 Essential (primary) hypertension: Secondary | ICD-10-CM | POA: Diagnosis not present

## 2019-04-14 DIAGNOSIS — E1122 Type 2 diabetes mellitus with diabetic chronic kidney disease: Secondary | ICD-10-CM | POA: Diagnosis not present

## 2019-04-14 DIAGNOSIS — E871 Hypo-osmolality and hyponatremia: Secondary | ICD-10-CM | POA: Diagnosis not present

## 2019-04-20 DIAGNOSIS — E1165 Type 2 diabetes mellitus with hyperglycemia: Secondary | ICD-10-CM | POA: Diagnosis not present

## 2019-04-20 DIAGNOSIS — E1122 Type 2 diabetes mellitus with diabetic chronic kidney disease: Secondary | ICD-10-CM | POA: Diagnosis not present

## 2019-04-20 DIAGNOSIS — Z6822 Body mass index (BMI) 22.0-22.9, adult: Secondary | ICD-10-CM | POA: Diagnosis not present

## 2019-04-20 DIAGNOSIS — E11319 Type 2 diabetes mellitus with unspecified diabetic retinopathy without macular edema: Secondary | ICD-10-CM | POA: Diagnosis not present

## 2019-04-20 DIAGNOSIS — I10 Essential (primary) hypertension: Secondary | ICD-10-CM | POA: Diagnosis not present

## 2019-04-20 DIAGNOSIS — Z0001 Encounter for general adult medical examination with abnormal findings: Secondary | ICD-10-CM | POA: Diagnosis not present

## 2019-05-26 DIAGNOSIS — H40033 Anatomical narrow angle, bilateral: Secondary | ICD-10-CM | POA: Diagnosis not present

## 2019-05-26 DIAGNOSIS — H04123 Dry eye syndrome of bilateral lacrimal glands: Secondary | ICD-10-CM | POA: Diagnosis not present

## 2019-06-09 DIAGNOSIS — Z6823 Body mass index (BMI) 23.0-23.9, adult: Secondary | ICD-10-CM | POA: Diagnosis not present

## 2019-06-09 DIAGNOSIS — W57XXXA Bitten or stung by nonvenomous insect and other nonvenomous arthropods, initial encounter: Secondary | ICD-10-CM | POA: Diagnosis not present

## 2019-06-09 DIAGNOSIS — R350 Frequency of micturition: Secondary | ICD-10-CM | POA: Diagnosis not present

## 2019-06-09 DIAGNOSIS — R609 Edema, unspecified: Secondary | ICD-10-CM | POA: Diagnosis not present

## 2019-06-09 DIAGNOSIS — S41152A Open bite of left upper arm, initial encounter: Secondary | ICD-10-CM | POA: Diagnosis not present

## 2019-08-09 DIAGNOSIS — E1122 Type 2 diabetes mellitus with diabetic chronic kidney disease: Secondary | ICD-10-CM | POA: Diagnosis not present

## 2019-08-09 DIAGNOSIS — K219 Gastro-esophageal reflux disease without esophagitis: Secondary | ICD-10-CM | POA: Diagnosis not present

## 2019-08-09 DIAGNOSIS — E782 Mixed hyperlipidemia: Secondary | ICD-10-CM | POA: Diagnosis not present

## 2019-08-09 DIAGNOSIS — E871 Hypo-osmolality and hyponatremia: Secondary | ICD-10-CM | POA: Diagnosis not present

## 2019-08-09 DIAGNOSIS — R5383 Other fatigue: Secondary | ICD-10-CM | POA: Diagnosis not present

## 2019-08-09 DIAGNOSIS — N183 Chronic kidney disease, stage 3 (moderate): Secondary | ICD-10-CM | POA: Diagnosis not present

## 2019-08-09 DIAGNOSIS — R42 Dizziness and giddiness: Secondary | ICD-10-CM | POA: Diagnosis not present

## 2019-08-30 DIAGNOSIS — Z9181 History of falling: Secondary | ICD-10-CM | POA: Diagnosis not present

## 2019-08-30 DIAGNOSIS — M25552 Pain in left hip: Secondary | ICD-10-CM | POA: Diagnosis not present

## 2019-08-30 DIAGNOSIS — M25512 Pain in left shoulder: Secondary | ICD-10-CM | POA: Diagnosis not present

## 2019-08-30 DIAGNOSIS — R269 Unspecified abnormalities of gait and mobility: Secondary | ICD-10-CM | POA: Diagnosis not present

## 2019-09-01 DIAGNOSIS — M25512 Pain in left shoulder: Secondary | ICD-10-CM | POA: Diagnosis not present

## 2019-09-01 DIAGNOSIS — R269 Unspecified abnormalities of gait and mobility: Secondary | ICD-10-CM | POA: Diagnosis not present

## 2019-09-01 DIAGNOSIS — M25552 Pain in left hip: Secondary | ICD-10-CM | POA: Diagnosis not present

## 2019-09-01 DIAGNOSIS — Z9181 History of falling: Secondary | ICD-10-CM | POA: Diagnosis not present

## 2019-09-05 DIAGNOSIS — M25552 Pain in left hip: Secondary | ICD-10-CM | POA: Diagnosis not present

## 2019-09-05 DIAGNOSIS — R269 Unspecified abnormalities of gait and mobility: Secondary | ICD-10-CM | POA: Diagnosis not present

## 2019-09-05 DIAGNOSIS — Z9181 History of falling: Secondary | ICD-10-CM | POA: Diagnosis not present

## 2019-09-05 DIAGNOSIS — M25512 Pain in left shoulder: Secondary | ICD-10-CM | POA: Diagnosis not present

## 2019-09-08 DIAGNOSIS — Z9181 History of falling: Secondary | ICD-10-CM | POA: Diagnosis not present

## 2019-09-08 DIAGNOSIS — M25512 Pain in left shoulder: Secondary | ICD-10-CM | POA: Diagnosis not present

## 2019-09-08 DIAGNOSIS — R269 Unspecified abnormalities of gait and mobility: Secondary | ICD-10-CM | POA: Diagnosis not present

## 2019-09-08 DIAGNOSIS — M25552 Pain in left hip: Secondary | ICD-10-CM | POA: Diagnosis not present

## 2019-09-13 DIAGNOSIS — M25512 Pain in left shoulder: Secondary | ICD-10-CM | POA: Diagnosis not present

## 2019-09-13 DIAGNOSIS — Z9181 History of falling: Secondary | ICD-10-CM | POA: Diagnosis not present

## 2019-09-13 DIAGNOSIS — R269 Unspecified abnormalities of gait and mobility: Secondary | ICD-10-CM | POA: Diagnosis not present

## 2019-09-13 DIAGNOSIS — M25552 Pain in left hip: Secondary | ICD-10-CM | POA: Diagnosis not present

## 2019-09-16 DIAGNOSIS — Z9181 History of falling: Secondary | ICD-10-CM | POA: Diagnosis not present

## 2019-09-16 DIAGNOSIS — R269 Unspecified abnormalities of gait and mobility: Secondary | ICD-10-CM | POA: Diagnosis not present

## 2019-09-16 DIAGNOSIS — M25512 Pain in left shoulder: Secondary | ICD-10-CM | POA: Diagnosis not present

## 2019-09-16 DIAGNOSIS — M25552 Pain in left hip: Secondary | ICD-10-CM | POA: Diagnosis not present

## 2019-09-21 DIAGNOSIS — M25552 Pain in left hip: Secondary | ICD-10-CM | POA: Diagnosis not present

## 2019-09-21 DIAGNOSIS — R269 Unspecified abnormalities of gait and mobility: Secondary | ICD-10-CM | POA: Diagnosis not present

## 2019-09-21 DIAGNOSIS — Z9181 History of falling: Secondary | ICD-10-CM | POA: Diagnosis not present

## 2019-09-21 DIAGNOSIS — M25512 Pain in left shoulder: Secondary | ICD-10-CM | POA: Diagnosis not present

## 2019-09-23 DIAGNOSIS — R269 Unspecified abnormalities of gait and mobility: Secondary | ICD-10-CM | POA: Diagnosis not present

## 2019-09-23 DIAGNOSIS — Z9181 History of falling: Secondary | ICD-10-CM | POA: Diagnosis not present

## 2019-09-23 DIAGNOSIS — M25552 Pain in left hip: Secondary | ICD-10-CM | POA: Diagnosis not present

## 2019-09-23 DIAGNOSIS — M25512 Pain in left shoulder: Secondary | ICD-10-CM | POA: Diagnosis not present

## 2019-09-27 DIAGNOSIS — Z9181 History of falling: Secondary | ICD-10-CM | POA: Diagnosis not present

## 2019-09-27 DIAGNOSIS — R269 Unspecified abnormalities of gait and mobility: Secondary | ICD-10-CM | POA: Diagnosis not present

## 2019-09-27 DIAGNOSIS — M25552 Pain in left hip: Secondary | ICD-10-CM | POA: Diagnosis not present

## 2019-09-27 DIAGNOSIS — M25512 Pain in left shoulder: Secondary | ICD-10-CM | POA: Diagnosis not present

## 2019-09-29 DIAGNOSIS — Z9181 History of falling: Secondary | ICD-10-CM | POA: Diagnosis not present

## 2019-09-29 DIAGNOSIS — R269 Unspecified abnormalities of gait and mobility: Secondary | ICD-10-CM | POA: Diagnosis not present

## 2019-09-29 DIAGNOSIS — M25512 Pain in left shoulder: Secondary | ICD-10-CM | POA: Diagnosis not present

## 2019-09-29 DIAGNOSIS — M25552 Pain in left hip: Secondary | ICD-10-CM | POA: Diagnosis not present

## 2019-10-04 DIAGNOSIS — M25552 Pain in left hip: Secondary | ICD-10-CM | POA: Diagnosis not present

## 2019-10-04 DIAGNOSIS — M25512 Pain in left shoulder: Secondary | ICD-10-CM | POA: Diagnosis not present

## 2019-10-04 DIAGNOSIS — R269 Unspecified abnormalities of gait and mobility: Secondary | ICD-10-CM | POA: Diagnosis not present

## 2019-10-04 DIAGNOSIS — Z9181 History of falling: Secondary | ICD-10-CM | POA: Diagnosis not present

## 2019-10-06 DIAGNOSIS — R269 Unspecified abnormalities of gait and mobility: Secondary | ICD-10-CM | POA: Diagnosis not present

## 2019-10-06 DIAGNOSIS — M25552 Pain in left hip: Secondary | ICD-10-CM | POA: Diagnosis not present

## 2019-10-06 DIAGNOSIS — M25512 Pain in left shoulder: Secondary | ICD-10-CM | POA: Diagnosis not present

## 2019-10-06 DIAGNOSIS — Z9181 History of falling: Secondary | ICD-10-CM | POA: Diagnosis not present

## 2019-10-11 DIAGNOSIS — R269 Unspecified abnormalities of gait and mobility: Secondary | ICD-10-CM | POA: Diagnosis not present

## 2019-10-11 DIAGNOSIS — M25512 Pain in left shoulder: Secondary | ICD-10-CM | POA: Diagnosis not present

## 2019-10-11 DIAGNOSIS — M25552 Pain in left hip: Secondary | ICD-10-CM | POA: Diagnosis not present

## 2019-10-11 DIAGNOSIS — Z9181 History of falling: Secondary | ICD-10-CM | POA: Diagnosis not present

## 2019-10-14 DIAGNOSIS — M25512 Pain in left shoulder: Secondary | ICD-10-CM | POA: Diagnosis not present

## 2019-10-14 DIAGNOSIS — Z9181 History of falling: Secondary | ICD-10-CM | POA: Diagnosis not present

## 2019-10-14 DIAGNOSIS — M25552 Pain in left hip: Secondary | ICD-10-CM | POA: Diagnosis not present

## 2019-10-14 DIAGNOSIS — R269 Unspecified abnormalities of gait and mobility: Secondary | ICD-10-CM | POA: Diagnosis not present

## 2019-10-19 DIAGNOSIS — M25512 Pain in left shoulder: Secondary | ICD-10-CM | POA: Diagnosis not present

## 2019-10-19 DIAGNOSIS — M25552 Pain in left hip: Secondary | ICD-10-CM | POA: Diagnosis not present

## 2019-10-19 DIAGNOSIS — Z9181 History of falling: Secondary | ICD-10-CM | POA: Diagnosis not present

## 2019-10-19 DIAGNOSIS — R269 Unspecified abnormalities of gait and mobility: Secondary | ICD-10-CM | POA: Diagnosis not present

## 2019-10-21 DIAGNOSIS — Z9181 History of falling: Secondary | ICD-10-CM | POA: Diagnosis not present

## 2019-10-21 DIAGNOSIS — R269 Unspecified abnormalities of gait and mobility: Secondary | ICD-10-CM | POA: Diagnosis not present

## 2019-10-21 DIAGNOSIS — M25552 Pain in left hip: Secondary | ICD-10-CM | POA: Diagnosis not present

## 2019-10-21 DIAGNOSIS — M25512 Pain in left shoulder: Secondary | ICD-10-CM | POA: Diagnosis not present

## 2019-10-26 DIAGNOSIS — Z9181 History of falling: Secondary | ICD-10-CM | POA: Diagnosis not present

## 2019-10-26 DIAGNOSIS — M25512 Pain in left shoulder: Secondary | ICD-10-CM | POA: Diagnosis not present

## 2019-10-26 DIAGNOSIS — M25552 Pain in left hip: Secondary | ICD-10-CM | POA: Diagnosis not present

## 2019-10-26 DIAGNOSIS — R269 Unspecified abnormalities of gait and mobility: Secondary | ICD-10-CM | POA: Diagnosis not present

## 2019-10-28 DIAGNOSIS — Z9181 History of falling: Secondary | ICD-10-CM | POA: Diagnosis not present

## 2019-10-28 DIAGNOSIS — M25512 Pain in left shoulder: Secondary | ICD-10-CM | POA: Diagnosis not present

## 2019-10-28 DIAGNOSIS — M25552 Pain in left hip: Secondary | ICD-10-CM | POA: Diagnosis not present

## 2019-10-28 DIAGNOSIS — R269 Unspecified abnormalities of gait and mobility: Secondary | ICD-10-CM | POA: Diagnosis not present

## 2019-11-02 DIAGNOSIS — M25512 Pain in left shoulder: Secondary | ICD-10-CM | POA: Diagnosis not present

## 2019-11-02 DIAGNOSIS — Z9181 History of falling: Secondary | ICD-10-CM | POA: Diagnosis not present

## 2019-11-02 DIAGNOSIS — M25552 Pain in left hip: Secondary | ICD-10-CM | POA: Diagnosis not present

## 2019-11-02 DIAGNOSIS — R269 Unspecified abnormalities of gait and mobility: Secondary | ICD-10-CM | POA: Diagnosis not present

## 2019-11-04 DIAGNOSIS — M25552 Pain in left hip: Secondary | ICD-10-CM | POA: Diagnosis not present

## 2019-11-04 DIAGNOSIS — M25512 Pain in left shoulder: Secondary | ICD-10-CM | POA: Diagnosis not present

## 2019-11-04 DIAGNOSIS — Z9181 History of falling: Secondary | ICD-10-CM | POA: Diagnosis not present

## 2019-11-04 DIAGNOSIS — R269 Unspecified abnormalities of gait and mobility: Secondary | ICD-10-CM | POA: Diagnosis not present

## 2019-11-07 DIAGNOSIS — M25552 Pain in left hip: Secondary | ICD-10-CM | POA: Diagnosis not present

## 2019-11-07 DIAGNOSIS — R269 Unspecified abnormalities of gait and mobility: Secondary | ICD-10-CM | POA: Diagnosis not present

## 2019-11-07 DIAGNOSIS — M25512 Pain in left shoulder: Secondary | ICD-10-CM | POA: Diagnosis not present

## 2019-11-07 DIAGNOSIS — Z9181 History of falling: Secondary | ICD-10-CM | POA: Diagnosis not present

## 2019-11-08 DIAGNOSIS — M25552 Pain in left hip: Secondary | ICD-10-CM | POA: Diagnosis not present

## 2019-11-08 DIAGNOSIS — M25512 Pain in left shoulder: Secondary | ICD-10-CM | POA: Diagnosis not present

## 2019-11-08 DIAGNOSIS — Z9181 History of falling: Secondary | ICD-10-CM | POA: Diagnosis not present

## 2019-11-08 DIAGNOSIS — R269 Unspecified abnormalities of gait and mobility: Secondary | ICD-10-CM | POA: Diagnosis not present

## 2019-11-14 DIAGNOSIS — K219 Gastro-esophageal reflux disease without esophagitis: Secondary | ICD-10-CM | POA: Diagnosis not present

## 2019-11-14 DIAGNOSIS — I10 Essential (primary) hypertension: Secondary | ICD-10-CM | POA: Diagnosis not present

## 2019-12-15 DIAGNOSIS — E782 Mixed hyperlipidemia: Secondary | ICD-10-CM | POA: Diagnosis not present

## 2019-12-15 DIAGNOSIS — I10 Essential (primary) hypertension: Secondary | ICD-10-CM | POA: Diagnosis not present

## 2019-12-18 DIAGNOSIS — E7800 Pure hypercholesterolemia, unspecified: Secondary | ICD-10-CM | POA: Diagnosis not present

## 2019-12-18 DIAGNOSIS — N183 Chronic kidney disease, stage 3 unspecified: Secondary | ICD-10-CM | POA: Diagnosis not present

## 2019-12-18 DIAGNOSIS — E1122 Type 2 diabetes mellitus with diabetic chronic kidney disease: Secondary | ICD-10-CM | POA: Diagnosis not present

## 2019-12-21 DIAGNOSIS — E871 Hypo-osmolality and hyponatremia: Secondary | ICD-10-CM | POA: Diagnosis not present

## 2019-12-21 DIAGNOSIS — E1122 Type 2 diabetes mellitus with diabetic chronic kidney disease: Secondary | ICD-10-CM | POA: Diagnosis not present

## 2019-12-21 DIAGNOSIS — I10 Essential (primary) hypertension: Secondary | ICD-10-CM | POA: Diagnosis not present

## 2019-12-21 DIAGNOSIS — E782 Mixed hyperlipidemia: Secondary | ICD-10-CM | POA: Diagnosis not present

## 2019-12-21 DIAGNOSIS — K219 Gastro-esophageal reflux disease without esophagitis: Secondary | ICD-10-CM | POA: Diagnosis not present

## 2019-12-26 DIAGNOSIS — E11319 Type 2 diabetes mellitus with unspecified diabetic retinopathy without macular edema: Secondary | ICD-10-CM | POA: Diagnosis not present

## 2019-12-26 DIAGNOSIS — E1165 Type 2 diabetes mellitus with hyperglycemia: Secondary | ICD-10-CM | POA: Diagnosis not present

## 2019-12-26 DIAGNOSIS — I10 Essential (primary) hypertension: Secondary | ICD-10-CM | POA: Diagnosis not present

## 2019-12-26 DIAGNOSIS — E1122 Type 2 diabetes mellitus with diabetic chronic kidney disease: Secondary | ICD-10-CM | POA: Diagnosis not present

## 2019-12-26 DIAGNOSIS — K219 Gastro-esophageal reflux disease without esophagitis: Secondary | ICD-10-CM | POA: Diagnosis not present

## 2020-01-13 DIAGNOSIS — E7849 Other hyperlipidemia: Secondary | ICD-10-CM | POA: Diagnosis not present

## 2020-01-13 DIAGNOSIS — I10 Essential (primary) hypertension: Secondary | ICD-10-CM | POA: Diagnosis not present

## 2020-02-10 DIAGNOSIS — E7849 Other hyperlipidemia: Secondary | ICD-10-CM | POA: Diagnosis not present

## 2020-02-10 DIAGNOSIS — I10 Essential (primary) hypertension: Secondary | ICD-10-CM | POA: Diagnosis not present

## 2020-03-12 DIAGNOSIS — Z08 Encounter for follow-up examination after completed treatment for malignant neoplasm: Secondary | ICD-10-CM | POA: Diagnosis not present

## 2020-03-12 DIAGNOSIS — X32XXXA Exposure to sunlight, initial encounter: Secondary | ICD-10-CM | POA: Diagnosis not present

## 2020-03-12 DIAGNOSIS — D0462 Carcinoma in situ of skin of left upper limb, including shoulder: Secondary | ICD-10-CM | POA: Diagnosis not present

## 2020-03-12 DIAGNOSIS — Z85828 Personal history of other malignant neoplasm of skin: Secondary | ICD-10-CM | POA: Diagnosis not present

## 2020-03-12 DIAGNOSIS — L570 Actinic keratosis: Secondary | ICD-10-CM | POA: Diagnosis not present

## 2020-04-18 DIAGNOSIS — E11319 Type 2 diabetes mellitus with unspecified diabetic retinopathy without macular edema: Secondary | ICD-10-CM | POA: Diagnosis not present

## 2020-04-18 DIAGNOSIS — E7800 Pure hypercholesterolemia, unspecified: Secondary | ICD-10-CM | POA: Diagnosis not present

## 2020-04-18 DIAGNOSIS — E1165 Type 2 diabetes mellitus with hyperglycemia: Secondary | ICD-10-CM | POA: Diagnosis not present

## 2020-04-18 DIAGNOSIS — E1122 Type 2 diabetes mellitus with diabetic chronic kidney disease: Secondary | ICD-10-CM | POA: Diagnosis not present

## 2020-04-18 DIAGNOSIS — K219 Gastro-esophageal reflux disease without esophagitis: Secondary | ICD-10-CM | POA: Diagnosis not present

## 2020-04-18 DIAGNOSIS — M419 Scoliosis, unspecified: Secondary | ICD-10-CM | POA: Diagnosis not present

## 2020-04-23 DIAGNOSIS — Z85828 Personal history of other malignant neoplasm of skin: Secondary | ICD-10-CM | POA: Diagnosis not present

## 2020-04-23 DIAGNOSIS — X32XXXD Exposure to sunlight, subsequent encounter: Secondary | ICD-10-CM | POA: Diagnosis not present

## 2020-04-23 DIAGNOSIS — D044 Carcinoma in situ of skin of scalp and neck: Secondary | ICD-10-CM | POA: Diagnosis not present

## 2020-04-23 DIAGNOSIS — Z08 Encounter for follow-up examination after completed treatment for malignant neoplasm: Secondary | ICD-10-CM | POA: Diagnosis not present

## 2020-04-23 DIAGNOSIS — L570 Actinic keratosis: Secondary | ICD-10-CM | POA: Diagnosis not present

## 2020-04-24 DIAGNOSIS — E1122 Type 2 diabetes mellitus with diabetic chronic kidney disease: Secondary | ICD-10-CM | POA: Diagnosis not present

## 2020-04-24 DIAGNOSIS — I10 Essential (primary) hypertension: Secondary | ICD-10-CM | POA: Diagnosis not present

## 2020-04-24 DIAGNOSIS — E1165 Type 2 diabetes mellitus with hyperglycemia: Secondary | ICD-10-CM | POA: Diagnosis not present

## 2020-04-24 DIAGNOSIS — S72002D Fracture of unspecified part of neck of left femur, subsequent encounter for closed fracture with routine healing: Secondary | ICD-10-CM | POA: Diagnosis not present

## 2020-04-24 DIAGNOSIS — Z0001 Encounter for general adult medical examination with abnormal findings: Secondary | ICD-10-CM | POA: Diagnosis not present

## 2020-04-24 DIAGNOSIS — Z23 Encounter for immunization: Secondary | ICD-10-CM | POA: Diagnosis not present

## 2020-06-13 DIAGNOSIS — G5601 Carpal tunnel syndrome, right upper limb: Secondary | ICD-10-CM | POA: Diagnosis not present

## 2020-06-13 DIAGNOSIS — N183 Chronic kidney disease, stage 3 unspecified: Secondary | ICD-10-CM | POA: Diagnosis not present

## 2020-06-13 DIAGNOSIS — E1122 Type 2 diabetes mellitus with diabetic chronic kidney disease: Secondary | ICD-10-CM | POA: Diagnosis not present

## 2020-06-13 DIAGNOSIS — I129 Hypertensive chronic kidney disease with stage 1 through stage 4 chronic kidney disease, or unspecified chronic kidney disease: Secondary | ICD-10-CM | POA: Diagnosis not present

## 2020-06-22 DIAGNOSIS — L03116 Cellulitis of left lower limb: Secondary | ICD-10-CM | POA: Diagnosis not present

## 2020-06-22 DIAGNOSIS — S81812A Laceration without foreign body, left lower leg, initial encounter: Secondary | ICD-10-CM | POA: Diagnosis not present

## 2020-06-29 DIAGNOSIS — S81812A Laceration without foreign body, left lower leg, initial encounter: Secondary | ICD-10-CM | POA: Diagnosis not present

## 2020-06-30 DIAGNOSIS — E119 Type 2 diabetes mellitus without complications: Secondary | ICD-10-CM | POA: Diagnosis not present

## 2020-06-30 DIAGNOSIS — I082 Rheumatic disorders of both aortic and tricuspid valves: Secondary | ICD-10-CM | POA: Diagnosis not present

## 2020-06-30 DIAGNOSIS — G9389 Other specified disorders of brain: Secondary | ICD-10-CM | POA: Diagnosis not present

## 2020-06-30 DIAGNOSIS — Z743 Need for continuous supervision: Secondary | ICD-10-CM | POA: Diagnosis not present

## 2020-06-30 DIAGNOSIS — I6782 Cerebral ischemia: Secondary | ICD-10-CM | POA: Diagnosis not present

## 2020-06-30 DIAGNOSIS — R011 Cardiac murmur, unspecified: Secondary | ICD-10-CM | POA: Insufficient documentation

## 2020-06-30 DIAGNOSIS — Z981 Arthrodesis status: Secondary | ICD-10-CM | POA: Diagnosis not present

## 2020-06-30 DIAGNOSIS — R9082 White matter disease, unspecified: Secondary | ICD-10-CM | POA: Diagnosis not present

## 2020-06-30 DIAGNOSIS — R55 Syncope and collapse: Secondary | ICD-10-CM | POA: Diagnosis not present

## 2020-06-30 DIAGNOSIS — I10 Essential (primary) hypertension: Secondary | ICD-10-CM | POA: Diagnosis not present

## 2020-06-30 DIAGNOSIS — R42 Dizziness and giddiness: Secondary | ICD-10-CM | POA: Diagnosis not present

## 2020-06-30 DIAGNOSIS — R0602 Shortness of breath: Secondary | ICD-10-CM | POA: Diagnosis not present

## 2020-06-30 DIAGNOSIS — Z20822 Contact with and (suspected) exposure to covid-19: Secondary | ICD-10-CM | POA: Diagnosis not present

## 2020-06-30 DIAGNOSIS — J90 Pleural effusion, not elsewhere classified: Secondary | ICD-10-CM | POA: Diagnosis not present

## 2020-06-30 DIAGNOSIS — I959 Hypotension, unspecified: Secondary | ICD-10-CM | POA: Diagnosis not present

## 2020-06-30 DIAGNOSIS — I499 Cardiac arrhythmia, unspecified: Secondary | ICD-10-CM | POA: Diagnosis not present

## 2020-06-30 DIAGNOSIS — E785 Hyperlipidemia, unspecified: Secondary | ICD-10-CM | POA: Diagnosis not present

## 2020-06-30 DIAGNOSIS — I712 Thoracic aortic aneurysm, without rupture: Secondary | ICD-10-CM | POA: Diagnosis not present

## 2020-06-30 DIAGNOSIS — I6389 Other cerebral infarction: Secondary | ICD-10-CM | POA: Diagnosis not present

## 2020-06-30 DIAGNOSIS — Z880 Allergy status to penicillin: Secondary | ICD-10-CM | POA: Diagnosis not present

## 2020-06-30 DIAGNOSIS — Z7983 Long term (current) use of bisphosphonates: Secondary | ICD-10-CM | POA: Diagnosis not present

## 2020-06-30 DIAGNOSIS — Z7984 Long term (current) use of oral hypoglycemic drugs: Secondary | ICD-10-CM | POA: Diagnosis not present

## 2020-06-30 DIAGNOSIS — R5381 Other malaise: Secondary | ICD-10-CM | POA: Diagnosis not present

## 2020-06-30 DIAGNOSIS — R531 Weakness: Secondary | ICD-10-CM | POA: Diagnosis not present

## 2020-06-30 DIAGNOSIS — R001 Bradycardia, unspecified: Secondary | ICD-10-CM | POA: Diagnosis not present

## 2020-06-30 DIAGNOSIS — E1169 Type 2 diabetes mellitus with other specified complication: Secondary | ICD-10-CM | POA: Diagnosis not present

## 2020-06-30 DIAGNOSIS — R52 Pain, unspecified: Secondary | ICD-10-CM | POA: Diagnosis not present

## 2020-07-18 DIAGNOSIS — E1165 Type 2 diabetes mellitus with hyperglycemia: Secondary | ICD-10-CM | POA: Diagnosis not present

## 2020-07-18 DIAGNOSIS — E11319 Type 2 diabetes mellitus with unspecified diabetic retinopathy without macular edema: Secondary | ICD-10-CM | POA: Diagnosis not present

## 2020-07-18 DIAGNOSIS — E1122 Type 2 diabetes mellitus with diabetic chronic kidney disease: Secondary | ICD-10-CM | POA: Diagnosis not present

## 2020-07-18 DIAGNOSIS — R55 Syncope and collapse: Secondary | ICD-10-CM | POA: Diagnosis not present

## 2020-07-18 DIAGNOSIS — Z6824 Body mass index (BMI) 24.0-24.9, adult: Secondary | ICD-10-CM | POA: Diagnosis not present

## 2020-07-18 DIAGNOSIS — E782 Mixed hyperlipidemia: Secondary | ICD-10-CM | POA: Diagnosis not present

## 2020-08-01 DIAGNOSIS — E1122 Type 2 diabetes mellitus with diabetic chronic kidney disease: Secondary | ICD-10-CM | POA: Diagnosis not present

## 2020-08-01 DIAGNOSIS — E11319 Type 2 diabetes mellitus with unspecified diabetic retinopathy without macular edema: Secondary | ICD-10-CM | POA: Diagnosis not present

## 2020-08-01 DIAGNOSIS — E1165 Type 2 diabetes mellitus with hyperglycemia: Secondary | ICD-10-CM | POA: Diagnosis not present

## 2020-08-01 DIAGNOSIS — R55 Syncope and collapse: Secondary | ICD-10-CM | POA: Diagnosis not present

## 2020-08-01 DIAGNOSIS — E782 Mixed hyperlipidemia: Secondary | ICD-10-CM | POA: Diagnosis not present

## 2020-08-21 DIAGNOSIS — E1122 Type 2 diabetes mellitus with diabetic chronic kidney disease: Secondary | ICD-10-CM | POA: Diagnosis not present

## 2020-08-21 DIAGNOSIS — E782 Mixed hyperlipidemia: Secondary | ICD-10-CM | POA: Diagnosis not present

## 2020-08-21 DIAGNOSIS — E1165 Type 2 diabetes mellitus with hyperglycemia: Secondary | ICD-10-CM | POA: Diagnosis not present

## 2020-08-21 DIAGNOSIS — R5383 Other fatigue: Secondary | ICD-10-CM | POA: Diagnosis not present

## 2020-08-21 DIAGNOSIS — E7800 Pure hypercholesterolemia, unspecified: Secondary | ICD-10-CM | POA: Diagnosis not present

## 2020-08-21 DIAGNOSIS — E871 Hypo-osmolality and hyponatremia: Secondary | ICD-10-CM | POA: Diagnosis not present

## 2020-08-24 DIAGNOSIS — E1165 Type 2 diabetes mellitus with hyperglycemia: Secondary | ICD-10-CM | POA: Diagnosis not present

## 2020-08-24 DIAGNOSIS — E11319 Type 2 diabetes mellitus with unspecified diabetic retinopathy without macular edema: Secondary | ICD-10-CM | POA: Diagnosis not present

## 2020-08-24 DIAGNOSIS — E782 Mixed hyperlipidemia: Secondary | ICD-10-CM | POA: Diagnosis not present

## 2020-08-24 DIAGNOSIS — I10 Essential (primary) hypertension: Secondary | ICD-10-CM | POA: Diagnosis not present

## 2020-08-24 DIAGNOSIS — E1122 Type 2 diabetes mellitus with diabetic chronic kidney disease: Secondary | ICD-10-CM | POA: Diagnosis not present

## 2020-08-24 DIAGNOSIS — Z6824 Body mass index (BMI) 24.0-24.9, adult: Secondary | ICD-10-CM | POA: Diagnosis not present

## 2020-10-13 DIAGNOSIS — I129 Hypertensive chronic kidney disease with stage 1 through stage 4 chronic kidney disease, or unspecified chronic kidney disease: Secondary | ICD-10-CM | POA: Diagnosis not present

## 2020-10-13 DIAGNOSIS — E1122 Type 2 diabetes mellitus with diabetic chronic kidney disease: Secondary | ICD-10-CM | POA: Diagnosis not present

## 2020-10-13 DIAGNOSIS — G5601 Carpal tunnel syndrome, right upper limb: Secondary | ICD-10-CM | POA: Diagnosis not present

## 2020-10-13 DIAGNOSIS — N183 Chronic kidney disease, stage 3 unspecified: Secondary | ICD-10-CM | POA: Diagnosis not present

## 2020-11-13 DIAGNOSIS — I129 Hypertensive chronic kidney disease with stage 1 through stage 4 chronic kidney disease, or unspecified chronic kidney disease: Secondary | ICD-10-CM | POA: Diagnosis not present

## 2020-11-13 DIAGNOSIS — E1122 Type 2 diabetes mellitus with diabetic chronic kidney disease: Secondary | ICD-10-CM | POA: Diagnosis not present

## 2020-11-13 DIAGNOSIS — N183 Chronic kidney disease, stage 3 unspecified: Secondary | ICD-10-CM | POA: Diagnosis not present

## 2020-11-14 DIAGNOSIS — E119 Type 2 diabetes mellitus without complications: Secondary | ICD-10-CM | POA: Diagnosis not present

## 2020-11-14 DIAGNOSIS — H353132 Nonexudative age-related macular degeneration, bilateral, intermediate dry stage: Secondary | ICD-10-CM | POA: Diagnosis not present

## 2020-11-14 DIAGNOSIS — H26491 Other secondary cataract, right eye: Secondary | ICD-10-CM | POA: Diagnosis not present

## 2020-11-14 DIAGNOSIS — H04123 Dry eye syndrome of bilateral lacrimal glands: Secondary | ICD-10-CM | POA: Diagnosis not present

## 2020-11-14 DIAGNOSIS — H524 Presbyopia: Secondary | ICD-10-CM | POA: Diagnosis not present

## 2020-12-14 DIAGNOSIS — I129 Hypertensive chronic kidney disease with stage 1 through stage 4 chronic kidney disease, or unspecified chronic kidney disease: Secondary | ICD-10-CM | POA: Diagnosis not present

## 2020-12-14 DIAGNOSIS — G5601 Carpal tunnel syndrome, right upper limb: Secondary | ICD-10-CM | POA: Diagnosis not present

## 2020-12-14 DIAGNOSIS — E1122 Type 2 diabetes mellitus with diabetic chronic kidney disease: Secondary | ICD-10-CM | POA: Diagnosis not present

## 2020-12-14 DIAGNOSIS — N183 Chronic kidney disease, stage 3 unspecified: Secondary | ICD-10-CM | POA: Diagnosis not present

## 2020-12-17 DIAGNOSIS — E1122 Type 2 diabetes mellitus with diabetic chronic kidney disease: Secondary | ICD-10-CM | POA: Diagnosis not present

## 2020-12-17 DIAGNOSIS — N183 Chronic kidney disease, stage 3 unspecified: Secondary | ICD-10-CM | POA: Diagnosis not present

## 2020-12-20 DIAGNOSIS — I10 Essential (primary) hypertension: Secondary | ICD-10-CM | POA: Diagnosis not present

## 2020-12-20 DIAGNOSIS — E1165 Type 2 diabetes mellitus with hyperglycemia: Secondary | ICD-10-CM | POA: Diagnosis not present

## 2020-12-20 DIAGNOSIS — M791 Myalgia, unspecified site: Secondary | ICD-10-CM | POA: Diagnosis not present

## 2020-12-20 DIAGNOSIS — E11319 Type 2 diabetes mellitus with unspecified diabetic retinopathy without macular edema: Secondary | ICD-10-CM | POA: Diagnosis not present

## 2020-12-20 DIAGNOSIS — E782 Mixed hyperlipidemia: Secondary | ICD-10-CM | POA: Diagnosis not present

## 2020-12-20 DIAGNOSIS — E1122 Type 2 diabetes mellitus with diabetic chronic kidney disease: Secondary | ICD-10-CM | POA: Diagnosis not present

## 2020-12-20 DIAGNOSIS — T466X5A Adverse effect of antihyperlipidemic and antiarteriosclerotic drugs, initial encounter: Secondary | ICD-10-CM | POA: Diagnosis not present

## 2020-12-20 DIAGNOSIS — I712 Thoracic aortic aneurysm, without rupture: Secondary | ICD-10-CM | POA: Diagnosis not present

## 2021-01-12 DIAGNOSIS — I129 Hypertensive chronic kidney disease with stage 1 through stage 4 chronic kidney disease, or unspecified chronic kidney disease: Secondary | ICD-10-CM | POA: Diagnosis not present

## 2021-01-12 DIAGNOSIS — N183 Chronic kidney disease, stage 3 unspecified: Secondary | ICD-10-CM | POA: Diagnosis not present

## 2021-01-12 DIAGNOSIS — E1122 Type 2 diabetes mellitus with diabetic chronic kidney disease: Secondary | ICD-10-CM | POA: Diagnosis not present

## 2021-01-12 DIAGNOSIS — G5601 Carpal tunnel syndrome, right upper limb: Secondary | ICD-10-CM | POA: Diagnosis not present

## 2021-02-11 DIAGNOSIS — N183 Chronic kidney disease, stage 3 unspecified: Secondary | ICD-10-CM | POA: Diagnosis not present

## 2021-02-11 DIAGNOSIS — E1122 Type 2 diabetes mellitus with diabetic chronic kidney disease: Secondary | ICD-10-CM | POA: Diagnosis not present

## 2021-02-11 DIAGNOSIS — G5601 Carpal tunnel syndrome, right upper limb: Secondary | ICD-10-CM | POA: Diagnosis not present

## 2021-02-11 DIAGNOSIS — I129 Hypertensive chronic kidney disease with stage 1 through stage 4 chronic kidney disease, or unspecified chronic kidney disease: Secondary | ICD-10-CM | POA: Diagnosis not present

## 2021-03-13 DIAGNOSIS — N183 Chronic kidney disease, stage 3 unspecified: Secondary | ICD-10-CM | POA: Diagnosis not present

## 2021-03-13 DIAGNOSIS — E1122 Type 2 diabetes mellitus with diabetic chronic kidney disease: Secondary | ICD-10-CM | POA: Diagnosis not present

## 2021-03-13 DIAGNOSIS — E1165 Type 2 diabetes mellitus with hyperglycemia: Secondary | ICD-10-CM | POA: Diagnosis not present

## 2021-03-13 DIAGNOSIS — E782 Mixed hyperlipidemia: Secondary | ICD-10-CM | POA: Diagnosis not present

## 2021-03-13 DIAGNOSIS — I10 Essential (primary) hypertension: Secondary | ICD-10-CM | POA: Diagnosis not present

## 2021-03-13 DIAGNOSIS — K219 Gastro-esophageal reflux disease without esophagitis: Secondary | ICD-10-CM | POA: Diagnosis not present

## 2021-04-13 DIAGNOSIS — N183 Chronic kidney disease, stage 3 unspecified: Secondary | ICD-10-CM | POA: Diagnosis not present

## 2021-04-13 DIAGNOSIS — E1122 Type 2 diabetes mellitus with diabetic chronic kidney disease: Secondary | ICD-10-CM | POA: Diagnosis not present

## 2021-04-13 DIAGNOSIS — I129 Hypertensive chronic kidney disease with stage 1 through stage 4 chronic kidney disease, or unspecified chronic kidney disease: Secondary | ICD-10-CM | POA: Diagnosis not present

## 2021-04-13 DIAGNOSIS — G5601 Carpal tunnel syndrome, right upper limb: Secondary | ICD-10-CM | POA: Diagnosis not present

## 2021-05-13 DIAGNOSIS — I10 Essential (primary) hypertension: Secondary | ICD-10-CM | POA: Diagnosis not present

## 2021-05-13 DIAGNOSIS — K219 Gastro-esophageal reflux disease without esophagitis: Secondary | ICD-10-CM | POA: Diagnosis not present

## 2021-05-13 DIAGNOSIS — N183 Chronic kidney disease, stage 3 unspecified: Secondary | ICD-10-CM | POA: Diagnosis not present

## 2021-05-13 DIAGNOSIS — E1122 Type 2 diabetes mellitus with diabetic chronic kidney disease: Secondary | ICD-10-CM | POA: Diagnosis not present

## 2021-05-13 DIAGNOSIS — I129 Hypertensive chronic kidney disease with stage 1 through stage 4 chronic kidney disease, or unspecified chronic kidney disease: Secondary | ICD-10-CM | POA: Diagnosis not present

## 2021-05-13 DIAGNOSIS — G5601 Carpal tunnel syndrome, right upper limb: Secondary | ICD-10-CM | POA: Diagnosis not present

## 2021-05-13 DIAGNOSIS — E1165 Type 2 diabetes mellitus with hyperglycemia: Secondary | ICD-10-CM | POA: Diagnosis not present

## 2021-06-03 DIAGNOSIS — E1122 Type 2 diabetes mellitus with diabetic chronic kidney disease: Secondary | ICD-10-CM | POA: Diagnosis not present

## 2021-06-03 DIAGNOSIS — R5383 Other fatigue: Secondary | ICD-10-CM | POA: Diagnosis not present

## 2021-06-03 DIAGNOSIS — E782 Mixed hyperlipidemia: Secondary | ICD-10-CM | POA: Diagnosis not present

## 2021-06-03 DIAGNOSIS — E7849 Other hyperlipidemia: Secondary | ICD-10-CM | POA: Diagnosis not present

## 2021-06-03 DIAGNOSIS — N183 Chronic kidney disease, stage 3 unspecified: Secondary | ICD-10-CM | POA: Diagnosis not present

## 2021-06-05 DIAGNOSIS — R0902 Hypoxemia: Secondary | ICD-10-CM | POA: Diagnosis not present

## 2021-06-05 DIAGNOSIS — I6782 Cerebral ischemia: Secondary | ICD-10-CM | POA: Diagnosis not present

## 2021-06-05 DIAGNOSIS — W1839XA Other fall on same level, initial encounter: Secondary | ICD-10-CM | POA: Diagnosis not present

## 2021-06-05 DIAGNOSIS — R519 Headache, unspecified: Secondary | ICD-10-CM | POA: Diagnosis not present

## 2021-06-05 DIAGNOSIS — I700 Atherosclerosis of aorta: Secondary | ICD-10-CM | POA: Diagnosis not present

## 2021-06-05 DIAGNOSIS — S22089A Unspecified fracture of T11-T12 vertebra, initial encounter for closed fracture: Secondary | ICD-10-CM | POA: Diagnosis not present

## 2021-06-05 DIAGNOSIS — S12590A Other displaced fracture of sixth cervical vertebra, initial encounter for closed fracture: Secondary | ICD-10-CM | POA: Diagnosis not present

## 2021-06-05 DIAGNOSIS — I712 Thoracic aortic aneurysm, without rupture: Secondary | ICD-10-CM | POA: Diagnosis not present

## 2021-06-05 DIAGNOSIS — R778 Other specified abnormalities of plasma proteins: Secondary | ICD-10-CM | POA: Diagnosis not present

## 2021-06-05 DIAGNOSIS — S8001XA Contusion of right knee, initial encounter: Secondary | ICD-10-CM | POA: Diagnosis not present

## 2021-06-05 DIAGNOSIS — K760 Fatty (change of) liver, not elsewhere classified: Secondary | ICD-10-CM | POA: Diagnosis not present

## 2021-06-05 DIAGNOSIS — Z20822 Contact with and (suspected) exposure to covid-19: Secondary | ICD-10-CM | POA: Diagnosis not present

## 2021-06-05 DIAGNOSIS — S2242XA Multiple fractures of ribs, left side, initial encounter for closed fracture: Secondary | ICD-10-CM | POA: Diagnosis not present

## 2021-06-05 DIAGNOSIS — S72002A Fracture of unspecified part of neck of left femur, initial encounter for closed fracture: Secondary | ICD-10-CM | POA: Diagnosis not present

## 2021-06-05 DIAGNOSIS — S129XXA Fracture of neck, unspecified, initial encounter: Secondary | ICD-10-CM | POA: Diagnosis not present

## 2021-06-05 DIAGNOSIS — R011 Cardiac murmur, unspecified: Secondary | ICD-10-CM | POA: Diagnosis not present

## 2021-06-05 DIAGNOSIS — S0003XA Contusion of scalp, initial encounter: Secondary | ICD-10-CM | POA: Diagnosis not present

## 2021-06-05 DIAGNOSIS — S3993XA Unspecified injury of pelvis, initial encounter: Secondary | ICD-10-CM | POA: Diagnosis not present

## 2021-06-05 DIAGNOSIS — Z743 Need for continuous supervision: Secondary | ICD-10-CM | POA: Diagnosis not present

## 2021-06-05 DIAGNOSIS — M47812 Spondylosis without myelopathy or radiculopathy, cervical region: Secondary | ICD-10-CM | POA: Diagnosis not present

## 2021-06-05 DIAGNOSIS — M1711 Unilateral primary osteoarthritis, right knee: Secondary | ICD-10-CM | POA: Diagnosis not present

## 2021-06-05 DIAGNOSIS — T675XXA Heat exhaustion, unspecified, initial encounter: Secondary | ICD-10-CM | POA: Diagnosis not present

## 2021-06-05 DIAGNOSIS — S12501A Unspecified nondisplaced fracture of sixth cervical vertebra, initial encounter for closed fracture: Secondary | ICD-10-CM | POA: Diagnosis not present

## 2021-06-05 DIAGNOSIS — N281 Cyst of kidney, acquired: Secondary | ICD-10-CM | POA: Diagnosis not present

## 2021-06-05 DIAGNOSIS — M5030 Other cervical disc degeneration, unspecified cervical region: Secondary | ICD-10-CM | POA: Diagnosis not present

## 2021-06-05 DIAGNOSIS — J9811 Atelectasis: Secondary | ICD-10-CM | POA: Diagnosis not present

## 2021-06-05 DIAGNOSIS — R509 Fever, unspecified: Secondary | ICD-10-CM | POA: Diagnosis not present

## 2021-06-05 DIAGNOSIS — M85861 Other specified disorders of bone density and structure, right lower leg: Secondary | ICD-10-CM | POA: Diagnosis not present

## 2021-06-05 DIAGNOSIS — S0001XA Abrasion of scalp, initial encounter: Secondary | ICD-10-CM | POA: Diagnosis not present

## 2021-06-05 DIAGNOSIS — K5730 Diverticulosis of large intestine without perforation or abscess without bleeding: Secondary | ICD-10-CM | POA: Diagnosis not present

## 2021-06-05 DIAGNOSIS — S8011XA Contusion of right lower leg, initial encounter: Secondary | ICD-10-CM | POA: Diagnosis not present

## 2021-06-05 DIAGNOSIS — I6789 Other cerebrovascular disease: Secondary | ICD-10-CM | POA: Diagnosis not present

## 2021-06-05 DIAGNOSIS — R531 Weakness: Secondary | ICD-10-CM | POA: Diagnosis not present

## 2021-06-05 DIAGNOSIS — S2232XA Fracture of one rib, left side, initial encounter for closed fracture: Secondary | ICD-10-CM | POA: Diagnosis not present

## 2021-06-05 DIAGNOSIS — E785 Hyperlipidemia, unspecified: Secondary | ICD-10-CM | POA: Diagnosis not present

## 2021-06-05 DIAGNOSIS — S8991XA Unspecified injury of right lower leg, initial encounter: Secondary | ICD-10-CM | POA: Diagnosis not present

## 2021-06-05 DIAGNOSIS — E119 Type 2 diabetes mellitus without complications: Secondary | ICD-10-CM | POA: Diagnosis not present

## 2021-06-05 DIAGNOSIS — Z043 Encounter for examination and observation following other accident: Secondary | ICD-10-CM | POA: Diagnosis not present

## 2021-06-05 DIAGNOSIS — L84 Corns and callosities: Secondary | ICD-10-CM | POA: Diagnosis not present

## 2021-06-05 DIAGNOSIS — E871 Hypo-osmolality and hyponatremia: Secondary | ICD-10-CM | POA: Diagnosis not present

## 2021-06-05 DIAGNOSIS — S0990XA Unspecified injury of head, initial encounter: Secondary | ICD-10-CM | POA: Diagnosis not present

## 2021-06-05 DIAGNOSIS — E875 Hyperkalemia: Secondary | ICD-10-CM | POA: Diagnosis not present

## 2021-06-05 DIAGNOSIS — R55 Syncope and collapse: Secondary | ICD-10-CM | POA: Diagnosis not present

## 2021-06-05 DIAGNOSIS — M545 Low back pain, unspecified: Secondary | ICD-10-CM | POA: Diagnosis not present

## 2021-06-05 DIAGNOSIS — I499 Cardiac arrhythmia, unspecified: Secondary | ICD-10-CM | POA: Diagnosis not present

## 2021-06-06 DIAGNOSIS — E785 Hyperlipidemia, unspecified: Secondary | ICD-10-CM | POA: Insufficient documentation

## 2021-06-06 DIAGNOSIS — I712 Thoracic aortic aneurysm, without rupture: Secondary | ICD-10-CM | POA: Diagnosis not present

## 2021-06-06 DIAGNOSIS — I700 Atherosclerosis of aorta: Secondary | ICD-10-CM | POA: Diagnosis not present

## 2021-06-06 DIAGNOSIS — M48061 Spinal stenosis, lumbar region without neurogenic claudication: Secondary | ICD-10-CM | POA: Diagnosis not present

## 2021-06-06 DIAGNOSIS — W19XXXA Unspecified fall, initial encounter: Secondary | ICD-10-CM | POA: Diagnosis not present

## 2021-06-06 DIAGNOSIS — R011 Cardiac murmur, unspecified: Secondary | ICD-10-CM | POA: Diagnosis not present

## 2021-06-06 DIAGNOSIS — I10 Essential (primary) hypertension: Secondary | ICD-10-CM | POA: Diagnosis not present

## 2021-06-06 DIAGNOSIS — S0003XA Contusion of scalp, initial encounter: Secondary | ICD-10-CM | POA: Diagnosis not present

## 2021-06-06 DIAGNOSIS — S8011XA Contusion of right lower leg, initial encounter: Secondary | ICD-10-CM | POA: Diagnosis not present

## 2021-06-06 DIAGNOSIS — M170 Bilateral primary osteoarthritis of knee: Secondary | ICD-10-CM | POA: Diagnosis not present

## 2021-06-06 DIAGNOSIS — S22089A Unspecified fracture of T11-T12 vertebra, initial encounter for closed fracture: Secondary | ICD-10-CM | POA: Diagnosis not present

## 2021-06-06 DIAGNOSIS — N390 Urinary tract infection, site not specified: Secondary | ICD-10-CM | POA: Diagnosis not present

## 2021-06-06 DIAGNOSIS — S0990XA Unspecified injury of head, initial encounter: Secondary | ICD-10-CM | POA: Diagnosis not present

## 2021-06-06 DIAGNOSIS — E875 Hyperkalemia: Secondary | ICD-10-CM | POA: Diagnosis not present

## 2021-06-06 DIAGNOSIS — S8001XA Contusion of right knee, initial encounter: Secondary | ICD-10-CM | POA: Diagnosis not present

## 2021-06-06 DIAGNOSIS — S0001XA Abrasion of scalp, initial encounter: Secondary | ICD-10-CM | POA: Diagnosis not present

## 2021-06-06 DIAGNOSIS — M5030 Other cervical disc degeneration, unspecified cervical region: Secondary | ICD-10-CM | POA: Diagnosis not present

## 2021-06-06 DIAGNOSIS — R55 Syncope and collapse: Secondary | ICD-10-CM | POA: Diagnosis not present

## 2021-06-06 DIAGNOSIS — I214 Non-ST elevation (NSTEMI) myocardial infarction: Secondary | ICD-10-CM | POA: Diagnosis not present

## 2021-06-06 DIAGNOSIS — E871 Hypo-osmolality and hyponatremia: Secondary | ICD-10-CM | POA: Diagnosis not present

## 2021-06-06 DIAGNOSIS — Y92009 Unspecified place in unspecified non-institutional (private) residence as the place of occurrence of the external cause: Secondary | ICD-10-CM | POA: Insufficient documentation

## 2021-06-06 DIAGNOSIS — R778 Other specified abnormalities of plasma proteins: Secondary | ICD-10-CM | POA: Diagnosis not present

## 2021-06-06 DIAGNOSIS — Z20822 Contact with and (suspected) exposure to covid-19: Secondary | ICD-10-CM | POA: Diagnosis not present

## 2021-06-06 DIAGNOSIS — K5730 Diverticulosis of large intestine without perforation or abscess without bleeding: Secondary | ICD-10-CM | POA: Diagnosis not present

## 2021-06-06 DIAGNOSIS — W19XXXS Unspecified fall, sequela: Secondary | ICD-10-CM | POA: Diagnosis not present

## 2021-06-06 DIAGNOSIS — E119 Type 2 diabetes mellitus without complications: Secondary | ICD-10-CM | POA: Diagnosis not present

## 2021-06-06 DIAGNOSIS — Z880 Allergy status to penicillin: Secondary | ICD-10-CM | POA: Diagnosis not present

## 2021-06-06 DIAGNOSIS — S12501A Unspecified nondisplaced fracture of sixth cervical vertebra, initial encounter for closed fracture: Secondary | ICD-10-CM | POA: Diagnosis not present

## 2021-06-06 DIAGNOSIS — I5A Non-ischemic myocardial injury (non-traumatic): Secondary | ICD-10-CM | POA: Diagnosis not present

## 2021-06-06 DIAGNOSIS — E1169 Type 2 diabetes mellitus with other specified complication: Secondary | ICD-10-CM | POA: Diagnosis not present

## 2021-06-06 DIAGNOSIS — R519 Headache, unspecified: Secondary | ICD-10-CM | POA: Diagnosis not present

## 2021-06-06 DIAGNOSIS — Z7983 Long term (current) use of bisphosphonates: Secondary | ICD-10-CM | POA: Diagnosis not present

## 2021-06-06 DIAGNOSIS — I6789 Other cerebrovascular disease: Secondary | ICD-10-CM | POA: Diagnosis not present

## 2021-06-06 DIAGNOSIS — H353230 Exudative age-related macular degeneration, bilateral, stage unspecified: Secondary | ICD-10-CM | POA: Insufficient documentation

## 2021-06-06 DIAGNOSIS — Z8673 Personal history of transient ischemic attack (TIA), and cerebral infarction without residual deficits: Secondary | ICD-10-CM | POA: Diagnosis not present

## 2021-06-06 DIAGNOSIS — R7989 Other specified abnormal findings of blood chemistry: Secondary | ICD-10-CM | POA: Insufficient documentation

## 2021-06-06 DIAGNOSIS — S129XXA Fracture of neck, unspecified, initial encounter: Secondary | ICD-10-CM | POA: Diagnosis not present

## 2021-06-06 DIAGNOSIS — R9439 Abnormal result of other cardiovascular function study: Secondary | ICD-10-CM | POA: Diagnosis not present

## 2021-06-06 DIAGNOSIS — R531 Weakness: Secondary | ICD-10-CM | POA: Diagnosis not present

## 2021-06-06 DIAGNOSIS — Z79899 Other long term (current) drug therapy: Secondary | ICD-10-CM | POA: Diagnosis not present

## 2021-06-06 DIAGNOSIS — Z7984 Long term (current) use of oral hypoglycemic drugs: Secondary | ICD-10-CM | POA: Diagnosis not present

## 2021-06-06 DIAGNOSIS — M25569 Pain in unspecified knee: Secondary | ICD-10-CM | POA: Diagnosis not present

## 2021-06-06 DIAGNOSIS — M47812 Spondylosis without myelopathy or radiculopathy, cervical region: Secondary | ICD-10-CM | POA: Diagnosis not present

## 2021-06-06 DIAGNOSIS — I2510 Atherosclerotic heart disease of native coronary artery without angina pectoris: Secondary | ICD-10-CM | POA: Diagnosis not present

## 2021-06-06 DIAGNOSIS — S2242XA Multiple fractures of ribs, left side, initial encounter for closed fracture: Secondary | ICD-10-CM | POA: Diagnosis not present

## 2021-06-06 DIAGNOSIS — L84 Corns and callosities: Secondary | ICD-10-CM | POA: Diagnosis not present

## 2021-06-06 DIAGNOSIS — R509 Fever, unspecified: Secondary | ICD-10-CM | POA: Diagnosis not present

## 2021-06-07 DIAGNOSIS — R55 Syncope and collapse: Secondary | ICD-10-CM | POA: Diagnosis not present

## 2021-06-07 DIAGNOSIS — H353230 Exudative age-related macular degeneration, bilateral, stage unspecified: Secondary | ICD-10-CM | POA: Diagnosis not present

## 2021-06-07 DIAGNOSIS — S129XXA Fracture of neck, unspecified, initial encounter: Secondary | ICD-10-CM | POA: Diagnosis not present

## 2021-06-07 DIAGNOSIS — R778 Other specified abnormalities of plasma proteins: Secondary | ICD-10-CM | POA: Diagnosis not present

## 2021-06-07 DIAGNOSIS — W19XXXA Unspecified fall, initial encounter: Secondary | ICD-10-CM | POA: Diagnosis not present

## 2021-06-07 DIAGNOSIS — R9439 Abnormal result of other cardiovascular function study: Secondary | ICD-10-CM | POA: Diagnosis not present

## 2021-06-08 DIAGNOSIS — H353230 Exudative age-related macular degeneration, bilateral, stage unspecified: Secondary | ICD-10-CM | POA: Diagnosis not present

## 2021-06-08 DIAGNOSIS — R55 Syncope and collapse: Secondary | ICD-10-CM | POA: Diagnosis not present

## 2021-06-08 DIAGNOSIS — R778 Other specified abnormalities of plasma proteins: Secondary | ICD-10-CM | POA: Diagnosis not present

## 2021-06-08 DIAGNOSIS — W19XXXA Unspecified fall, initial encounter: Secondary | ICD-10-CM | POA: Diagnosis not present

## 2021-06-08 DIAGNOSIS — S129XXA Fracture of neck, unspecified, initial encounter: Secondary | ICD-10-CM | POA: Diagnosis not present

## 2021-06-09 DIAGNOSIS — R55 Syncope and collapse: Secondary | ICD-10-CM | POA: Diagnosis not present

## 2021-06-18 DIAGNOSIS — R300 Dysuria: Secondary | ICD-10-CM | POA: Diagnosis not present

## 2021-06-18 DIAGNOSIS — N390 Urinary tract infection, site not specified: Secondary | ICD-10-CM | POA: Diagnosis not present

## 2021-06-19 DIAGNOSIS — Z9181 History of falling: Secondary | ICD-10-CM | POA: Diagnosis not present

## 2021-06-19 DIAGNOSIS — M170 Bilateral primary osteoarthritis of knee: Secondary | ICD-10-CM | POA: Diagnosis not present

## 2021-06-19 DIAGNOSIS — M8580 Other specified disorders of bone density and structure, unspecified site: Secondary | ICD-10-CM | POA: Diagnosis not present

## 2021-06-19 DIAGNOSIS — S12500D Unspecified displaced fracture of sixth cervical vertebra, subsequent encounter for fracture with routine healing: Secondary | ICD-10-CM | POA: Diagnosis not present

## 2021-06-19 DIAGNOSIS — E785 Hyperlipidemia, unspecified: Secondary | ICD-10-CM | POA: Diagnosis not present

## 2021-06-19 DIAGNOSIS — Z7984 Long term (current) use of oral hypoglycemic drugs: Secondary | ICD-10-CM | POA: Diagnosis not present

## 2021-06-19 DIAGNOSIS — I716 Thoracoabdominal aortic aneurysm, without rupture: Secondary | ICD-10-CM | POA: Diagnosis not present

## 2021-06-19 DIAGNOSIS — H353230 Exudative age-related macular degeneration, bilateral, stage unspecified: Secondary | ICD-10-CM | POA: Diagnosis not present

## 2021-06-19 DIAGNOSIS — R55 Syncope and collapse: Secondary | ICD-10-CM | POA: Diagnosis not present

## 2021-06-19 DIAGNOSIS — E119 Type 2 diabetes mellitus without complications: Secondary | ICD-10-CM | POA: Diagnosis not present

## 2021-06-21 DIAGNOSIS — R55 Syncope and collapse: Secondary | ICD-10-CM | POA: Diagnosis not present

## 2021-06-21 DIAGNOSIS — M170 Bilateral primary osteoarthritis of knee: Secondary | ICD-10-CM | POA: Diagnosis not present

## 2021-06-21 DIAGNOSIS — I716 Thoracoabdominal aortic aneurysm, without rupture: Secondary | ICD-10-CM | POA: Diagnosis not present

## 2021-06-21 DIAGNOSIS — H353230 Exudative age-related macular degeneration, bilateral, stage unspecified: Secondary | ICD-10-CM | POA: Diagnosis not present

## 2021-06-21 DIAGNOSIS — S12500D Unspecified displaced fracture of sixth cervical vertebra, subsequent encounter for fracture with routine healing: Secondary | ICD-10-CM | POA: Diagnosis not present

## 2021-06-21 DIAGNOSIS — M8580 Other specified disorders of bone density and structure, unspecified site: Secondary | ICD-10-CM | POA: Diagnosis not present

## 2021-06-21 DIAGNOSIS — E119 Type 2 diabetes mellitus without complications: Secondary | ICD-10-CM | POA: Diagnosis not present

## 2021-06-21 DIAGNOSIS — Z7984 Long term (current) use of oral hypoglycemic drugs: Secondary | ICD-10-CM | POA: Diagnosis not present

## 2021-06-21 DIAGNOSIS — Z9181 History of falling: Secondary | ICD-10-CM | POA: Diagnosis not present

## 2021-06-21 DIAGNOSIS — E785 Hyperlipidemia, unspecified: Secondary | ICD-10-CM | POA: Diagnosis not present

## 2021-06-22 DIAGNOSIS — Z7984 Long term (current) use of oral hypoglycemic drugs: Secondary | ICD-10-CM | POA: Diagnosis not present

## 2021-06-22 DIAGNOSIS — E119 Type 2 diabetes mellitus without complications: Secondary | ICD-10-CM | POA: Diagnosis not present

## 2021-06-22 DIAGNOSIS — I716 Thoracoabdominal aortic aneurysm, without rupture: Secondary | ICD-10-CM | POA: Diagnosis not present

## 2021-06-22 DIAGNOSIS — M170 Bilateral primary osteoarthritis of knee: Secondary | ICD-10-CM | POA: Diagnosis not present

## 2021-06-22 DIAGNOSIS — H353230 Exudative age-related macular degeneration, bilateral, stage unspecified: Secondary | ICD-10-CM | POA: Diagnosis not present

## 2021-06-22 DIAGNOSIS — M8580 Other specified disorders of bone density and structure, unspecified site: Secondary | ICD-10-CM | POA: Diagnosis not present

## 2021-06-22 DIAGNOSIS — Z9181 History of falling: Secondary | ICD-10-CM | POA: Diagnosis not present

## 2021-06-22 DIAGNOSIS — E785 Hyperlipidemia, unspecified: Secondary | ICD-10-CM | POA: Diagnosis not present

## 2021-06-22 DIAGNOSIS — R55 Syncope and collapse: Secondary | ICD-10-CM | POA: Diagnosis not present

## 2021-06-22 DIAGNOSIS — S12500D Unspecified displaced fracture of sixth cervical vertebra, subsequent encounter for fracture with routine healing: Secondary | ICD-10-CM | POA: Diagnosis not present

## 2021-06-24 DIAGNOSIS — E119 Type 2 diabetes mellitus without complications: Secondary | ICD-10-CM | POA: Diagnosis not present

## 2021-06-24 DIAGNOSIS — M8580 Other specified disorders of bone density and structure, unspecified site: Secondary | ICD-10-CM | POA: Diagnosis not present

## 2021-06-24 DIAGNOSIS — S12500D Unspecified displaced fracture of sixth cervical vertebra, subsequent encounter for fracture with routine healing: Secondary | ICD-10-CM | POA: Diagnosis not present

## 2021-06-24 DIAGNOSIS — M170 Bilateral primary osteoarthritis of knee: Secondary | ICD-10-CM | POA: Diagnosis not present

## 2021-06-24 DIAGNOSIS — R55 Syncope and collapse: Secondary | ICD-10-CM | POA: Diagnosis not present

## 2021-06-24 DIAGNOSIS — Z9181 History of falling: Secondary | ICD-10-CM | POA: Diagnosis not present

## 2021-06-24 DIAGNOSIS — I716 Thoracoabdominal aortic aneurysm, without rupture: Secondary | ICD-10-CM | POA: Diagnosis not present

## 2021-06-24 DIAGNOSIS — E785 Hyperlipidemia, unspecified: Secondary | ICD-10-CM | POA: Diagnosis not present

## 2021-06-24 DIAGNOSIS — H353230 Exudative age-related macular degeneration, bilateral, stage unspecified: Secondary | ICD-10-CM | POA: Diagnosis not present

## 2021-06-24 DIAGNOSIS — Z7984 Long term (current) use of oral hypoglycemic drugs: Secondary | ICD-10-CM | POA: Diagnosis not present

## 2021-06-26 DIAGNOSIS — E785 Hyperlipidemia, unspecified: Secondary | ICD-10-CM | POA: Diagnosis not present

## 2021-06-26 DIAGNOSIS — M8580 Other specified disorders of bone density and structure, unspecified site: Secondary | ICD-10-CM | POA: Diagnosis not present

## 2021-06-26 DIAGNOSIS — M170 Bilateral primary osteoarthritis of knee: Secondary | ICD-10-CM | POA: Diagnosis not present

## 2021-06-26 DIAGNOSIS — S12500D Unspecified displaced fracture of sixth cervical vertebra, subsequent encounter for fracture with routine healing: Secondary | ICD-10-CM | POA: Diagnosis not present

## 2021-06-26 DIAGNOSIS — E119 Type 2 diabetes mellitus without complications: Secondary | ICD-10-CM | POA: Diagnosis not present

## 2021-06-26 DIAGNOSIS — R55 Syncope and collapse: Secondary | ICD-10-CM | POA: Diagnosis not present

## 2021-06-26 DIAGNOSIS — Z9181 History of falling: Secondary | ICD-10-CM | POA: Diagnosis not present

## 2021-06-26 DIAGNOSIS — I716 Thoracoabdominal aortic aneurysm, without rupture: Secondary | ICD-10-CM | POA: Diagnosis not present

## 2021-06-26 DIAGNOSIS — H353230 Exudative age-related macular degeneration, bilateral, stage unspecified: Secondary | ICD-10-CM | POA: Diagnosis not present

## 2021-06-26 DIAGNOSIS — Z7984 Long term (current) use of oral hypoglycemic drugs: Secondary | ICD-10-CM | POA: Diagnosis not present

## 2021-06-28 DIAGNOSIS — R55 Syncope and collapse: Secondary | ICD-10-CM | POA: Diagnosis not present

## 2021-06-28 DIAGNOSIS — M170 Bilateral primary osteoarthritis of knee: Secondary | ICD-10-CM | POA: Diagnosis not present

## 2021-06-28 DIAGNOSIS — Z7984 Long term (current) use of oral hypoglycemic drugs: Secondary | ICD-10-CM | POA: Diagnosis not present

## 2021-06-28 DIAGNOSIS — I716 Thoracoabdominal aortic aneurysm, without rupture: Secondary | ICD-10-CM | POA: Diagnosis not present

## 2021-06-28 DIAGNOSIS — H353230 Exudative age-related macular degeneration, bilateral, stage unspecified: Secondary | ICD-10-CM | POA: Diagnosis not present

## 2021-06-28 DIAGNOSIS — M8580 Other specified disorders of bone density and structure, unspecified site: Secondary | ICD-10-CM | POA: Diagnosis not present

## 2021-06-28 DIAGNOSIS — E785 Hyperlipidemia, unspecified: Secondary | ICD-10-CM | POA: Diagnosis not present

## 2021-06-28 DIAGNOSIS — S12500D Unspecified displaced fracture of sixth cervical vertebra, subsequent encounter for fracture with routine healing: Secondary | ICD-10-CM | POA: Diagnosis not present

## 2021-06-28 DIAGNOSIS — E119 Type 2 diabetes mellitus without complications: Secondary | ICD-10-CM | POA: Diagnosis not present

## 2021-06-28 DIAGNOSIS — Z9181 History of falling: Secondary | ICD-10-CM | POA: Diagnosis not present

## 2021-07-01 DIAGNOSIS — H353230 Exudative age-related macular degeneration, bilateral, stage unspecified: Secondary | ICD-10-CM | POA: Diagnosis not present

## 2021-07-01 DIAGNOSIS — I716 Thoracoabdominal aortic aneurysm, without rupture: Secondary | ICD-10-CM | POA: Diagnosis not present

## 2021-07-01 DIAGNOSIS — M170 Bilateral primary osteoarthritis of knee: Secondary | ICD-10-CM | POA: Diagnosis not present

## 2021-07-01 DIAGNOSIS — M8580 Other specified disorders of bone density and structure, unspecified site: Secondary | ICD-10-CM | POA: Diagnosis not present

## 2021-07-01 DIAGNOSIS — R55 Syncope and collapse: Secondary | ICD-10-CM | POA: Diagnosis not present

## 2021-07-01 DIAGNOSIS — E785 Hyperlipidemia, unspecified: Secondary | ICD-10-CM | POA: Diagnosis not present

## 2021-07-01 DIAGNOSIS — E119 Type 2 diabetes mellitus without complications: Secondary | ICD-10-CM | POA: Diagnosis not present

## 2021-07-01 DIAGNOSIS — Z9181 History of falling: Secondary | ICD-10-CM | POA: Diagnosis not present

## 2021-07-01 DIAGNOSIS — Z7984 Long term (current) use of oral hypoglycemic drugs: Secondary | ICD-10-CM | POA: Diagnosis not present

## 2021-07-01 DIAGNOSIS — S12500D Unspecified displaced fracture of sixth cervical vertebra, subsequent encounter for fracture with routine healing: Secondary | ICD-10-CM | POA: Diagnosis not present

## 2021-07-03 DIAGNOSIS — E119 Type 2 diabetes mellitus without complications: Secondary | ICD-10-CM | POA: Diagnosis not present

## 2021-07-03 DIAGNOSIS — S12500D Unspecified displaced fracture of sixth cervical vertebra, subsequent encounter for fracture with routine healing: Secondary | ICD-10-CM | POA: Diagnosis not present

## 2021-07-03 DIAGNOSIS — H353230 Exudative age-related macular degeneration, bilateral, stage unspecified: Secondary | ICD-10-CM | POA: Diagnosis not present

## 2021-07-03 DIAGNOSIS — Z9181 History of falling: Secondary | ICD-10-CM | POA: Diagnosis not present

## 2021-07-03 DIAGNOSIS — Z7984 Long term (current) use of oral hypoglycemic drugs: Secondary | ICD-10-CM | POA: Diagnosis not present

## 2021-07-03 DIAGNOSIS — I716 Thoracoabdominal aortic aneurysm, without rupture: Secondary | ICD-10-CM | POA: Diagnosis not present

## 2021-07-03 DIAGNOSIS — M170 Bilateral primary osteoarthritis of knee: Secondary | ICD-10-CM | POA: Diagnosis not present

## 2021-07-03 DIAGNOSIS — R55 Syncope and collapse: Secondary | ICD-10-CM | POA: Diagnosis not present

## 2021-07-03 DIAGNOSIS — M8580 Other specified disorders of bone density and structure, unspecified site: Secondary | ICD-10-CM | POA: Diagnosis not present

## 2021-07-03 DIAGNOSIS — E785 Hyperlipidemia, unspecified: Secondary | ICD-10-CM | POA: Diagnosis not present

## 2021-07-05 DIAGNOSIS — Z9181 History of falling: Secondary | ICD-10-CM | POA: Diagnosis not present

## 2021-07-05 DIAGNOSIS — M170 Bilateral primary osteoarthritis of knee: Secondary | ICD-10-CM | POA: Diagnosis not present

## 2021-07-05 DIAGNOSIS — R55 Syncope and collapse: Secondary | ICD-10-CM | POA: Diagnosis not present

## 2021-07-05 DIAGNOSIS — H353230 Exudative age-related macular degeneration, bilateral, stage unspecified: Secondary | ICD-10-CM | POA: Diagnosis not present

## 2021-07-05 DIAGNOSIS — E785 Hyperlipidemia, unspecified: Secondary | ICD-10-CM | POA: Diagnosis not present

## 2021-07-05 DIAGNOSIS — M8580 Other specified disorders of bone density and structure, unspecified site: Secondary | ICD-10-CM | POA: Diagnosis not present

## 2021-07-05 DIAGNOSIS — Z7984 Long term (current) use of oral hypoglycemic drugs: Secondary | ICD-10-CM | POA: Diagnosis not present

## 2021-07-05 DIAGNOSIS — I716 Thoracoabdominal aortic aneurysm, without rupture: Secondary | ICD-10-CM | POA: Diagnosis not present

## 2021-07-05 DIAGNOSIS — E119 Type 2 diabetes mellitus without complications: Secondary | ICD-10-CM | POA: Diagnosis not present

## 2021-07-05 DIAGNOSIS — S12500D Unspecified displaced fracture of sixth cervical vertebra, subsequent encounter for fracture with routine healing: Secondary | ICD-10-CM | POA: Diagnosis not present

## 2021-07-08 DIAGNOSIS — S12500A Unspecified displaced fracture of sixth cervical vertebra, initial encounter for closed fracture: Secondary | ICD-10-CM | POA: Diagnosis not present

## 2021-07-08 DIAGNOSIS — N390 Urinary tract infection, site not specified: Secondary | ICD-10-CM | POA: Diagnosis not present

## 2021-07-08 DIAGNOSIS — Z6822 Body mass index (BMI) 22.0-22.9, adult: Secondary | ICD-10-CM | POA: Diagnosis not present

## 2021-07-08 DIAGNOSIS — R778 Other specified abnormalities of plasma proteins: Secondary | ICD-10-CM | POA: Diagnosis not present

## 2021-07-08 DIAGNOSIS — E1165 Type 2 diabetes mellitus with hyperglycemia: Secondary | ICD-10-CM | POA: Diagnosis not present

## 2021-07-12 DIAGNOSIS — M170 Bilateral primary osteoarthritis of knee: Secondary | ICD-10-CM | POA: Diagnosis not present

## 2021-07-12 DIAGNOSIS — Z9181 History of falling: Secondary | ICD-10-CM | POA: Diagnosis not present

## 2021-07-12 DIAGNOSIS — R410 Disorientation, unspecified: Secondary | ICD-10-CM | POA: Diagnosis not present

## 2021-07-12 DIAGNOSIS — R296 Repeated falls: Secondary | ICD-10-CM | POA: Diagnosis not present

## 2021-07-12 DIAGNOSIS — M1990 Unspecified osteoarthritis, unspecified site: Secondary | ICD-10-CM | POA: Diagnosis not present

## 2021-07-12 DIAGNOSIS — Z981 Arthrodesis status: Secondary | ICD-10-CM | POA: Diagnosis not present

## 2021-07-12 DIAGNOSIS — E785 Hyperlipidemia, unspecified: Secondary | ICD-10-CM | POA: Diagnosis not present

## 2021-07-12 DIAGNOSIS — Z79899 Other long term (current) drug therapy: Secondary | ICD-10-CM | POA: Diagnosis not present

## 2021-07-12 DIAGNOSIS — E1122 Type 2 diabetes mellitus with diabetic chronic kidney disease: Secondary | ICD-10-CM | POA: Diagnosis not present

## 2021-07-12 DIAGNOSIS — S2239XA Fracture of one rib, unspecified side, initial encounter for closed fracture: Secondary | ICD-10-CM | POA: Insufficient documentation

## 2021-07-12 DIAGNOSIS — Z885 Allergy status to narcotic agent status: Secondary | ICD-10-CM | POA: Diagnosis not present

## 2021-07-12 DIAGNOSIS — R531 Weakness: Secondary | ICD-10-CM | POA: Diagnosis not present

## 2021-07-12 DIAGNOSIS — R109 Unspecified abdominal pain: Secondary | ICD-10-CM | POA: Diagnosis not present

## 2021-07-12 DIAGNOSIS — M79606 Pain in leg, unspecified: Secondary | ICD-10-CM | POA: Diagnosis not present

## 2021-07-12 DIAGNOSIS — Z7984 Long term (current) use of oral hypoglycemic drugs: Secondary | ICD-10-CM | POA: Diagnosis not present

## 2021-07-12 DIAGNOSIS — Z880 Allergy status to penicillin: Secondary | ICD-10-CM | POA: Diagnosis not present

## 2021-07-12 DIAGNOSIS — J984 Other disorders of lung: Secondary | ICD-10-CM | POA: Diagnosis not present

## 2021-07-12 DIAGNOSIS — S3991XA Unspecified injury of abdomen, initial encounter: Secondary | ICD-10-CM | POA: Diagnosis not present

## 2021-07-12 DIAGNOSIS — W19XXXA Unspecified fall, initial encounter: Secondary | ICD-10-CM | POA: Diagnosis not present

## 2021-07-12 DIAGNOSIS — M1711 Unilateral primary osteoarthritis, right knee: Secondary | ICD-10-CM | POA: Diagnosis not present

## 2021-07-12 DIAGNOSIS — S2242XA Multiple fractures of ribs, left side, initial encounter for closed fracture: Secondary | ICD-10-CM | POA: Diagnosis not present

## 2021-07-12 DIAGNOSIS — S3993XA Unspecified injury of pelvis, initial encounter: Secondary | ICD-10-CM | POA: Diagnosis not present

## 2021-07-12 DIAGNOSIS — M79604 Pain in right leg: Secondary | ICD-10-CM | POA: Diagnosis not present

## 2021-07-12 DIAGNOSIS — R262 Difficulty in walking, not elsewhere classified: Secondary | ICD-10-CM | POA: Diagnosis not present

## 2021-07-12 DIAGNOSIS — I10 Essential (primary) hypertension: Secondary | ICD-10-CM | POA: Diagnosis not present

## 2021-07-12 DIAGNOSIS — I2510 Atherosclerotic heart disease of native coronary artery without angina pectoris: Secondary | ICD-10-CM | POA: Diagnosis not present

## 2021-07-12 DIAGNOSIS — S51012A Laceration without foreign body of left elbow, initial encounter: Secondary | ICD-10-CM | POA: Diagnosis not present

## 2021-07-12 DIAGNOSIS — Z20822 Contact with and (suspected) exposure to covid-19: Secondary | ICD-10-CM | POA: Diagnosis not present

## 2021-07-12 DIAGNOSIS — E119 Type 2 diabetes mellitus without complications: Secondary | ICD-10-CM | POA: Diagnosis not present

## 2021-07-12 DIAGNOSIS — Z7983 Long term (current) use of bisphosphonates: Secondary | ICD-10-CM | POA: Diagnosis not present

## 2021-07-12 DIAGNOSIS — S0990XA Unspecified injury of head, initial encounter: Secondary | ICD-10-CM | POA: Diagnosis not present

## 2021-07-13 DIAGNOSIS — R296 Repeated falls: Secondary | ICD-10-CM | POA: Diagnosis not present

## 2021-07-13 DIAGNOSIS — E119 Type 2 diabetes mellitus without complications: Secondary | ICD-10-CM | POA: Diagnosis not present

## 2021-07-13 DIAGNOSIS — W19XXXD Unspecified fall, subsequent encounter: Secondary | ICD-10-CM | POA: Diagnosis not present

## 2021-07-13 DIAGNOSIS — S2242XD Multiple fractures of ribs, left side, subsequent encounter for fracture with routine healing: Secondary | ICD-10-CM | POA: Diagnosis not present

## 2021-07-14 DIAGNOSIS — I10 Essential (primary) hypertension: Secondary | ICD-10-CM | POA: Diagnosis not present

## 2021-07-14 DIAGNOSIS — E1165 Type 2 diabetes mellitus with hyperglycemia: Secondary | ICD-10-CM | POA: Diagnosis not present

## 2021-07-14 DIAGNOSIS — K219 Gastro-esophageal reflux disease without esophagitis: Secondary | ICD-10-CM | POA: Diagnosis not present

## 2021-07-14 DIAGNOSIS — N183 Chronic kidney disease, stage 3 unspecified: Secondary | ICD-10-CM | POA: Diagnosis not present

## 2021-07-19 DIAGNOSIS — H353230 Exudative age-related macular degeneration, bilateral, stage unspecified: Secondary | ICD-10-CM | POA: Diagnosis not present

## 2021-07-19 DIAGNOSIS — M170 Bilateral primary osteoarthritis of knee: Secondary | ICD-10-CM | POA: Diagnosis not present

## 2021-07-19 DIAGNOSIS — S12500D Unspecified displaced fracture of sixth cervical vertebra, subsequent encounter for fracture with routine healing: Secondary | ICD-10-CM | POA: Diagnosis not present

## 2021-07-19 DIAGNOSIS — R55 Syncope and collapse: Secondary | ICD-10-CM | POA: Diagnosis not present

## 2021-07-19 DIAGNOSIS — I716 Thoracoabdominal aortic aneurysm, without rupture: Secondary | ICD-10-CM | POA: Diagnosis not present

## 2021-07-19 DIAGNOSIS — E119 Type 2 diabetes mellitus without complications: Secondary | ICD-10-CM | POA: Diagnosis not present

## 2021-07-19 DIAGNOSIS — Z9181 History of falling: Secondary | ICD-10-CM | POA: Diagnosis not present

## 2021-07-19 DIAGNOSIS — E785 Hyperlipidemia, unspecified: Secondary | ICD-10-CM | POA: Diagnosis not present

## 2021-07-19 DIAGNOSIS — M8580 Other specified disorders of bone density and structure, unspecified site: Secondary | ICD-10-CM | POA: Diagnosis not present

## 2021-07-19 DIAGNOSIS — Z7984 Long term (current) use of oral hypoglycemic drugs: Secondary | ICD-10-CM | POA: Diagnosis not present

## 2021-07-23 DIAGNOSIS — I716 Thoracoabdominal aortic aneurysm, without rupture: Secondary | ICD-10-CM | POA: Diagnosis not present

## 2021-07-23 DIAGNOSIS — E119 Type 2 diabetes mellitus without complications: Secondary | ICD-10-CM | POA: Diagnosis not present

## 2021-07-23 DIAGNOSIS — M8580 Other specified disorders of bone density and structure, unspecified site: Secondary | ICD-10-CM | POA: Diagnosis not present

## 2021-07-23 DIAGNOSIS — R55 Syncope and collapse: Secondary | ICD-10-CM | POA: Diagnosis not present

## 2021-07-23 DIAGNOSIS — H353230 Exudative age-related macular degeneration, bilateral, stage unspecified: Secondary | ICD-10-CM | POA: Diagnosis not present

## 2021-07-23 DIAGNOSIS — Z9181 History of falling: Secondary | ICD-10-CM | POA: Diagnosis not present

## 2021-07-23 DIAGNOSIS — E785 Hyperlipidemia, unspecified: Secondary | ICD-10-CM | POA: Diagnosis not present

## 2021-07-23 DIAGNOSIS — Z7984 Long term (current) use of oral hypoglycemic drugs: Secondary | ICD-10-CM | POA: Diagnosis not present

## 2021-07-23 DIAGNOSIS — M170 Bilateral primary osteoarthritis of knee: Secondary | ICD-10-CM | POA: Diagnosis not present

## 2021-07-23 DIAGNOSIS — S12500D Unspecified displaced fracture of sixth cervical vertebra, subsequent encounter for fracture with routine healing: Secondary | ICD-10-CM | POA: Diagnosis not present

## 2021-07-24 DIAGNOSIS — S12500D Unspecified displaced fracture of sixth cervical vertebra, subsequent encounter for fracture with routine healing: Secondary | ICD-10-CM | POA: Diagnosis not present

## 2021-07-24 DIAGNOSIS — E785 Hyperlipidemia, unspecified: Secondary | ICD-10-CM | POA: Diagnosis not present

## 2021-07-24 DIAGNOSIS — Z7984 Long term (current) use of oral hypoglycemic drugs: Secondary | ICD-10-CM | POA: Diagnosis not present

## 2021-07-24 DIAGNOSIS — E119 Type 2 diabetes mellitus without complications: Secondary | ICD-10-CM | POA: Diagnosis not present

## 2021-07-24 DIAGNOSIS — M8580 Other specified disorders of bone density and structure, unspecified site: Secondary | ICD-10-CM | POA: Diagnosis not present

## 2021-07-24 DIAGNOSIS — R55 Syncope and collapse: Secondary | ICD-10-CM | POA: Diagnosis not present

## 2021-07-24 DIAGNOSIS — Z9181 History of falling: Secondary | ICD-10-CM | POA: Diagnosis not present

## 2021-07-24 DIAGNOSIS — M170 Bilateral primary osteoarthritis of knee: Secondary | ICD-10-CM | POA: Diagnosis not present

## 2021-07-24 DIAGNOSIS — H353230 Exudative age-related macular degeneration, bilateral, stage unspecified: Secondary | ICD-10-CM | POA: Diagnosis not present

## 2021-07-24 DIAGNOSIS — I716 Thoracoabdominal aortic aneurysm, without rupture: Secondary | ICD-10-CM | POA: Diagnosis not present

## 2021-07-29 DIAGNOSIS — R55 Syncope and collapse: Secondary | ICD-10-CM | POA: Diagnosis not present

## 2021-07-29 DIAGNOSIS — E785 Hyperlipidemia, unspecified: Secondary | ICD-10-CM | POA: Diagnosis not present

## 2021-07-29 DIAGNOSIS — S12500D Unspecified displaced fracture of sixth cervical vertebra, subsequent encounter for fracture with routine healing: Secondary | ICD-10-CM | POA: Diagnosis not present

## 2021-07-29 DIAGNOSIS — Z9181 History of falling: Secondary | ICD-10-CM | POA: Diagnosis not present

## 2021-07-29 DIAGNOSIS — M170 Bilateral primary osteoarthritis of knee: Secondary | ICD-10-CM | POA: Diagnosis not present

## 2021-07-29 DIAGNOSIS — H353230 Exudative age-related macular degeneration, bilateral, stage unspecified: Secondary | ICD-10-CM | POA: Diagnosis not present

## 2021-07-29 DIAGNOSIS — I716 Thoracoabdominal aortic aneurysm, without rupture: Secondary | ICD-10-CM | POA: Diagnosis not present

## 2021-07-29 DIAGNOSIS — E119 Type 2 diabetes mellitus without complications: Secondary | ICD-10-CM | POA: Diagnosis not present

## 2021-07-29 DIAGNOSIS — M8580 Other specified disorders of bone density and structure, unspecified site: Secondary | ICD-10-CM | POA: Diagnosis not present

## 2021-07-29 DIAGNOSIS — Z7984 Long term (current) use of oral hypoglycemic drugs: Secondary | ICD-10-CM | POA: Diagnosis not present

## 2021-07-31 DIAGNOSIS — S12500D Unspecified displaced fracture of sixth cervical vertebra, subsequent encounter for fracture with routine healing: Secondary | ICD-10-CM | POA: Diagnosis not present

## 2021-07-31 DIAGNOSIS — H353230 Exudative age-related macular degeneration, bilateral, stage unspecified: Secondary | ICD-10-CM | POA: Diagnosis not present

## 2021-07-31 DIAGNOSIS — Z9181 History of falling: Secondary | ICD-10-CM | POA: Diagnosis not present

## 2021-07-31 DIAGNOSIS — M8580 Other specified disorders of bone density and structure, unspecified site: Secondary | ICD-10-CM | POA: Diagnosis not present

## 2021-07-31 DIAGNOSIS — Z7984 Long term (current) use of oral hypoglycemic drugs: Secondary | ICD-10-CM | POA: Diagnosis not present

## 2021-07-31 DIAGNOSIS — I716 Thoracoabdominal aortic aneurysm, without rupture: Secondary | ICD-10-CM | POA: Diagnosis not present

## 2021-07-31 DIAGNOSIS — R55 Syncope and collapse: Secondary | ICD-10-CM | POA: Diagnosis not present

## 2021-07-31 DIAGNOSIS — E119 Type 2 diabetes mellitus without complications: Secondary | ICD-10-CM | POA: Diagnosis not present

## 2021-07-31 DIAGNOSIS — E785 Hyperlipidemia, unspecified: Secondary | ICD-10-CM | POA: Diagnosis not present

## 2021-07-31 DIAGNOSIS — M170 Bilateral primary osteoarthritis of knee: Secondary | ICD-10-CM | POA: Diagnosis not present

## 2021-08-05 DIAGNOSIS — S12500D Unspecified displaced fracture of sixth cervical vertebra, subsequent encounter for fracture with routine healing: Secondary | ICD-10-CM | POA: Diagnosis not present

## 2021-08-05 DIAGNOSIS — I716 Thoracoabdominal aortic aneurysm, without rupture: Secondary | ICD-10-CM | POA: Diagnosis not present

## 2021-08-05 DIAGNOSIS — Z7984 Long term (current) use of oral hypoglycemic drugs: Secondary | ICD-10-CM | POA: Diagnosis not present

## 2021-08-05 DIAGNOSIS — M170 Bilateral primary osteoarthritis of knee: Secondary | ICD-10-CM | POA: Diagnosis not present

## 2021-08-05 DIAGNOSIS — M8580 Other specified disorders of bone density and structure, unspecified site: Secondary | ICD-10-CM | POA: Diagnosis not present

## 2021-08-05 DIAGNOSIS — E119 Type 2 diabetes mellitus without complications: Secondary | ICD-10-CM | POA: Diagnosis not present

## 2021-08-05 DIAGNOSIS — H353230 Exudative age-related macular degeneration, bilateral, stage unspecified: Secondary | ICD-10-CM | POA: Diagnosis not present

## 2021-08-05 DIAGNOSIS — R55 Syncope and collapse: Secondary | ICD-10-CM | POA: Diagnosis not present

## 2021-08-05 DIAGNOSIS — Z9181 History of falling: Secondary | ICD-10-CM | POA: Diagnosis not present

## 2021-08-05 DIAGNOSIS — E785 Hyperlipidemia, unspecified: Secondary | ICD-10-CM | POA: Diagnosis not present

## 2021-08-06 DIAGNOSIS — M8580 Other specified disorders of bone density and structure, unspecified site: Secondary | ICD-10-CM | POA: Diagnosis not present

## 2021-08-06 DIAGNOSIS — I716 Thoracoabdominal aortic aneurysm, without rupture: Secondary | ICD-10-CM | POA: Diagnosis not present

## 2021-08-06 DIAGNOSIS — E119 Type 2 diabetes mellitus without complications: Secondary | ICD-10-CM | POA: Diagnosis not present

## 2021-08-06 DIAGNOSIS — R55 Syncope and collapse: Secondary | ICD-10-CM | POA: Diagnosis not present

## 2021-08-06 DIAGNOSIS — H353230 Exudative age-related macular degeneration, bilateral, stage unspecified: Secondary | ICD-10-CM | POA: Diagnosis not present

## 2021-08-06 DIAGNOSIS — S12500D Unspecified displaced fracture of sixth cervical vertebra, subsequent encounter for fracture with routine healing: Secondary | ICD-10-CM | POA: Diagnosis not present

## 2021-08-06 DIAGNOSIS — E785 Hyperlipidemia, unspecified: Secondary | ICD-10-CM | POA: Diagnosis not present

## 2021-08-06 DIAGNOSIS — M170 Bilateral primary osteoarthritis of knee: Secondary | ICD-10-CM | POA: Diagnosis not present

## 2021-08-07 DIAGNOSIS — I716 Thoracoabdominal aortic aneurysm, without rupture: Secondary | ICD-10-CM | POA: Diagnosis not present

## 2021-08-07 DIAGNOSIS — S12500D Unspecified displaced fracture of sixth cervical vertebra, subsequent encounter for fracture with routine healing: Secondary | ICD-10-CM | POA: Diagnosis not present

## 2021-08-07 DIAGNOSIS — R55 Syncope and collapse: Secondary | ICD-10-CM | POA: Diagnosis not present

## 2021-08-07 DIAGNOSIS — H353230 Exudative age-related macular degeneration, bilateral, stage unspecified: Secondary | ICD-10-CM | POA: Diagnosis not present

## 2021-08-07 DIAGNOSIS — M8580 Other specified disorders of bone density and structure, unspecified site: Secondary | ICD-10-CM | POA: Diagnosis not present

## 2021-08-07 DIAGNOSIS — E119 Type 2 diabetes mellitus without complications: Secondary | ICD-10-CM | POA: Diagnosis not present

## 2021-08-07 DIAGNOSIS — Z9181 History of falling: Secondary | ICD-10-CM | POA: Diagnosis not present

## 2021-08-07 DIAGNOSIS — Z7984 Long term (current) use of oral hypoglycemic drugs: Secondary | ICD-10-CM | POA: Diagnosis not present

## 2021-08-07 DIAGNOSIS — M170 Bilateral primary osteoarthritis of knee: Secondary | ICD-10-CM | POA: Diagnosis not present

## 2021-08-07 DIAGNOSIS — E785 Hyperlipidemia, unspecified: Secondary | ICD-10-CM | POA: Diagnosis not present

## 2021-08-12 DIAGNOSIS — S2249XA Multiple fractures of ribs, unspecified side, initial encounter for closed fracture: Secondary | ICD-10-CM | POA: Diagnosis not present

## 2021-08-12 DIAGNOSIS — M810 Age-related osteoporosis without current pathological fracture: Secondary | ICD-10-CM | POA: Diagnosis not present

## 2021-08-12 DIAGNOSIS — R42 Dizziness and giddiness: Secondary | ICD-10-CM | POA: Diagnosis not present

## 2021-08-14 DIAGNOSIS — M8580 Other specified disorders of bone density and structure, unspecified site: Secondary | ICD-10-CM | POA: Diagnosis not present

## 2021-08-14 DIAGNOSIS — Z9181 History of falling: Secondary | ICD-10-CM | POA: Diagnosis not present

## 2021-08-14 DIAGNOSIS — N183 Chronic kidney disease, stage 3 unspecified: Secondary | ICD-10-CM | POA: Diagnosis not present

## 2021-08-14 DIAGNOSIS — R55 Syncope and collapse: Secondary | ICD-10-CM | POA: Diagnosis not present

## 2021-08-14 DIAGNOSIS — K219 Gastro-esophageal reflux disease without esophagitis: Secondary | ICD-10-CM | POA: Diagnosis not present

## 2021-08-14 DIAGNOSIS — I716 Thoracoabdominal aortic aneurysm, without rupture: Secondary | ICD-10-CM | POA: Diagnosis not present

## 2021-08-14 DIAGNOSIS — H353230 Exudative age-related macular degeneration, bilateral, stage unspecified: Secondary | ICD-10-CM | POA: Diagnosis not present

## 2021-08-14 DIAGNOSIS — E119 Type 2 diabetes mellitus without complications: Secondary | ICD-10-CM | POA: Diagnosis not present

## 2021-08-14 DIAGNOSIS — Z7984 Long term (current) use of oral hypoglycemic drugs: Secondary | ICD-10-CM | POA: Diagnosis not present

## 2021-08-14 DIAGNOSIS — S12500D Unspecified displaced fracture of sixth cervical vertebra, subsequent encounter for fracture with routine healing: Secondary | ICD-10-CM | POA: Diagnosis not present

## 2021-08-14 DIAGNOSIS — I10 Essential (primary) hypertension: Secondary | ICD-10-CM | POA: Diagnosis not present

## 2021-08-14 DIAGNOSIS — E1165 Type 2 diabetes mellitus with hyperglycemia: Secondary | ICD-10-CM | POA: Diagnosis not present

## 2021-08-14 DIAGNOSIS — E785 Hyperlipidemia, unspecified: Secondary | ICD-10-CM | POA: Diagnosis not present

## 2021-08-14 DIAGNOSIS — M170 Bilateral primary osteoarthritis of knee: Secondary | ICD-10-CM | POA: Diagnosis not present

## 2021-08-15 DIAGNOSIS — G8929 Other chronic pain: Secondary | ICD-10-CM | POA: Diagnosis not present

## 2021-08-15 DIAGNOSIS — M25462 Effusion, left knee: Secondary | ICD-10-CM | POA: Diagnosis not present

## 2021-08-15 DIAGNOSIS — M170 Bilateral primary osteoarthritis of knee: Secondary | ICD-10-CM | POA: Diagnosis not present

## 2021-08-15 DIAGNOSIS — M25561 Pain in right knee: Secondary | ICD-10-CM | POA: Diagnosis not present

## 2021-08-15 DIAGNOSIS — M25562 Pain in left knee: Secondary | ICD-10-CM | POA: Diagnosis not present

## 2021-08-16 DIAGNOSIS — H353230 Exudative age-related macular degeneration, bilateral, stage unspecified: Secondary | ICD-10-CM | POA: Diagnosis not present

## 2021-08-16 DIAGNOSIS — M8580 Other specified disorders of bone density and structure, unspecified site: Secondary | ICD-10-CM | POA: Diagnosis not present

## 2021-08-16 DIAGNOSIS — Z9181 History of falling: Secondary | ICD-10-CM | POA: Diagnosis not present

## 2021-08-16 DIAGNOSIS — Z7984 Long term (current) use of oral hypoglycemic drugs: Secondary | ICD-10-CM | POA: Diagnosis not present

## 2021-08-16 DIAGNOSIS — E785 Hyperlipidemia, unspecified: Secondary | ICD-10-CM | POA: Diagnosis not present

## 2021-08-16 DIAGNOSIS — I716 Thoracoabdominal aortic aneurysm, without rupture: Secondary | ICD-10-CM | POA: Diagnosis not present

## 2021-08-16 DIAGNOSIS — M170 Bilateral primary osteoarthritis of knee: Secondary | ICD-10-CM | POA: Diagnosis not present

## 2021-08-16 DIAGNOSIS — R55 Syncope and collapse: Secondary | ICD-10-CM | POA: Diagnosis not present

## 2021-08-16 DIAGNOSIS — E119 Type 2 diabetes mellitus without complications: Secondary | ICD-10-CM | POA: Diagnosis not present

## 2021-08-16 DIAGNOSIS — S12500D Unspecified displaced fracture of sixth cervical vertebra, subsequent encounter for fracture with routine healing: Secondary | ICD-10-CM | POA: Diagnosis not present

## 2021-08-21 DIAGNOSIS — Z7984 Long term (current) use of oral hypoglycemic drugs: Secondary | ICD-10-CM | POA: Diagnosis not present

## 2021-08-21 DIAGNOSIS — Z9181 History of falling: Secondary | ICD-10-CM | POA: Diagnosis not present

## 2021-08-21 DIAGNOSIS — R55 Syncope and collapse: Secondary | ICD-10-CM | POA: Diagnosis not present

## 2021-08-21 DIAGNOSIS — M8580 Other specified disorders of bone density and structure, unspecified site: Secondary | ICD-10-CM | POA: Diagnosis not present

## 2021-08-21 DIAGNOSIS — E119 Type 2 diabetes mellitus without complications: Secondary | ICD-10-CM | POA: Diagnosis not present

## 2021-08-21 DIAGNOSIS — I716 Thoracoabdominal aortic aneurysm, without rupture: Secondary | ICD-10-CM | POA: Diagnosis not present

## 2021-08-21 DIAGNOSIS — M170 Bilateral primary osteoarthritis of knee: Secondary | ICD-10-CM | POA: Diagnosis not present

## 2021-08-21 DIAGNOSIS — E785 Hyperlipidemia, unspecified: Secondary | ICD-10-CM | POA: Diagnosis not present

## 2021-08-21 DIAGNOSIS — H353230 Exudative age-related macular degeneration, bilateral, stage unspecified: Secondary | ICD-10-CM | POA: Diagnosis not present

## 2021-08-21 DIAGNOSIS — S12500D Unspecified displaced fracture of sixth cervical vertebra, subsequent encounter for fracture with routine healing: Secondary | ICD-10-CM | POA: Diagnosis not present

## 2021-08-22 DIAGNOSIS — M170 Bilateral primary osteoarthritis of knee: Secondary | ICD-10-CM | POA: Diagnosis not present

## 2021-08-22 DIAGNOSIS — Z7984 Long term (current) use of oral hypoglycemic drugs: Secondary | ICD-10-CM | POA: Diagnosis not present

## 2021-08-22 DIAGNOSIS — I716 Thoracoabdominal aortic aneurysm, without rupture: Secondary | ICD-10-CM | POA: Diagnosis not present

## 2021-08-22 DIAGNOSIS — E119 Type 2 diabetes mellitus without complications: Secondary | ICD-10-CM | POA: Diagnosis not present

## 2021-08-22 DIAGNOSIS — S12500D Unspecified displaced fracture of sixth cervical vertebra, subsequent encounter for fracture with routine healing: Secondary | ICD-10-CM | POA: Diagnosis not present

## 2021-08-22 DIAGNOSIS — R55 Syncope and collapse: Secondary | ICD-10-CM | POA: Diagnosis not present

## 2021-08-22 DIAGNOSIS — Z9181 History of falling: Secondary | ICD-10-CM | POA: Diagnosis not present

## 2021-08-22 DIAGNOSIS — E785 Hyperlipidemia, unspecified: Secondary | ICD-10-CM | POA: Diagnosis not present

## 2021-08-22 DIAGNOSIS — H353230 Exudative age-related macular degeneration, bilateral, stage unspecified: Secondary | ICD-10-CM | POA: Diagnosis not present

## 2021-08-22 DIAGNOSIS — M8580 Other specified disorders of bone density and structure, unspecified site: Secondary | ICD-10-CM | POA: Diagnosis not present

## 2021-08-26 DIAGNOSIS — M8580 Other specified disorders of bone density and structure, unspecified site: Secondary | ICD-10-CM | POA: Diagnosis not present

## 2021-08-26 DIAGNOSIS — S12500D Unspecified displaced fracture of sixth cervical vertebra, subsequent encounter for fracture with routine healing: Secondary | ICD-10-CM | POA: Diagnosis not present

## 2021-08-26 DIAGNOSIS — E119 Type 2 diabetes mellitus without complications: Secondary | ICD-10-CM | POA: Diagnosis not present

## 2021-08-26 DIAGNOSIS — Z7984 Long term (current) use of oral hypoglycemic drugs: Secondary | ICD-10-CM | POA: Diagnosis not present

## 2021-08-26 DIAGNOSIS — E785 Hyperlipidemia, unspecified: Secondary | ICD-10-CM | POA: Diagnosis not present

## 2021-08-26 DIAGNOSIS — I716 Thoracoabdominal aortic aneurysm, without rupture: Secondary | ICD-10-CM | POA: Diagnosis not present

## 2021-08-26 DIAGNOSIS — H353230 Exudative age-related macular degeneration, bilateral, stage unspecified: Secondary | ICD-10-CM | POA: Diagnosis not present

## 2021-08-26 DIAGNOSIS — Z9181 History of falling: Secondary | ICD-10-CM | POA: Diagnosis not present

## 2021-08-26 DIAGNOSIS — M170 Bilateral primary osteoarthritis of knee: Secondary | ICD-10-CM | POA: Diagnosis not present

## 2021-08-26 DIAGNOSIS — R55 Syncope and collapse: Secondary | ICD-10-CM | POA: Diagnosis not present

## 2021-08-29 DIAGNOSIS — I716 Thoracoabdominal aortic aneurysm, without rupture: Secondary | ICD-10-CM | POA: Diagnosis not present

## 2021-08-29 DIAGNOSIS — M8580 Other specified disorders of bone density and structure, unspecified site: Secondary | ICD-10-CM | POA: Diagnosis not present

## 2021-08-29 DIAGNOSIS — S12500D Unspecified displaced fracture of sixth cervical vertebra, subsequent encounter for fracture with routine healing: Secondary | ICD-10-CM | POA: Diagnosis not present

## 2021-08-29 DIAGNOSIS — E785 Hyperlipidemia, unspecified: Secondary | ICD-10-CM | POA: Diagnosis not present

## 2021-08-29 DIAGNOSIS — M170 Bilateral primary osteoarthritis of knee: Secondary | ICD-10-CM | POA: Diagnosis not present

## 2021-08-29 DIAGNOSIS — E119 Type 2 diabetes mellitus without complications: Secondary | ICD-10-CM | POA: Diagnosis not present

## 2021-08-29 DIAGNOSIS — R55 Syncope and collapse: Secondary | ICD-10-CM | POA: Diagnosis not present

## 2021-08-29 DIAGNOSIS — Z9181 History of falling: Secondary | ICD-10-CM | POA: Diagnosis not present

## 2021-08-29 DIAGNOSIS — Z7984 Long term (current) use of oral hypoglycemic drugs: Secondary | ICD-10-CM | POA: Diagnosis not present

## 2021-08-29 DIAGNOSIS — H353230 Exudative age-related macular degeneration, bilateral, stage unspecified: Secondary | ICD-10-CM | POA: Diagnosis not present

## 2021-09-02 DIAGNOSIS — E119 Type 2 diabetes mellitus without complications: Secondary | ICD-10-CM | POA: Diagnosis not present

## 2021-09-02 DIAGNOSIS — R55 Syncope and collapse: Secondary | ICD-10-CM | POA: Diagnosis not present

## 2021-09-02 DIAGNOSIS — E785 Hyperlipidemia, unspecified: Secondary | ICD-10-CM | POA: Diagnosis not present

## 2021-09-02 DIAGNOSIS — Z7984 Long term (current) use of oral hypoglycemic drugs: Secondary | ICD-10-CM | POA: Diagnosis not present

## 2021-09-02 DIAGNOSIS — Z9181 History of falling: Secondary | ICD-10-CM | POA: Diagnosis not present

## 2021-09-02 DIAGNOSIS — S12500D Unspecified displaced fracture of sixth cervical vertebra, subsequent encounter for fracture with routine healing: Secondary | ICD-10-CM | POA: Diagnosis not present

## 2021-09-02 DIAGNOSIS — H353230 Exudative age-related macular degeneration, bilateral, stage unspecified: Secondary | ICD-10-CM | POA: Diagnosis not present

## 2021-09-02 DIAGNOSIS — M170 Bilateral primary osteoarthritis of knee: Secondary | ICD-10-CM | POA: Diagnosis not present

## 2021-09-02 DIAGNOSIS — I716 Thoracoabdominal aortic aneurysm, without rupture: Secondary | ICD-10-CM | POA: Diagnosis not present

## 2021-09-02 DIAGNOSIS — M8580 Other specified disorders of bone density and structure, unspecified site: Secondary | ICD-10-CM | POA: Diagnosis not present

## 2021-09-06 DIAGNOSIS — H353230 Exudative age-related macular degeneration, bilateral, stage unspecified: Secondary | ICD-10-CM | POA: Diagnosis not present

## 2021-09-06 DIAGNOSIS — E119 Type 2 diabetes mellitus without complications: Secondary | ICD-10-CM | POA: Diagnosis not present

## 2021-09-06 DIAGNOSIS — E785 Hyperlipidemia, unspecified: Secondary | ICD-10-CM | POA: Diagnosis not present

## 2021-09-06 DIAGNOSIS — Z9181 History of falling: Secondary | ICD-10-CM | POA: Diagnosis not present

## 2021-09-06 DIAGNOSIS — M8580 Other specified disorders of bone density and structure, unspecified site: Secondary | ICD-10-CM | POA: Diagnosis not present

## 2021-09-06 DIAGNOSIS — S12500D Unspecified displaced fracture of sixth cervical vertebra, subsequent encounter for fracture with routine healing: Secondary | ICD-10-CM | POA: Diagnosis not present

## 2021-09-06 DIAGNOSIS — R55 Syncope and collapse: Secondary | ICD-10-CM | POA: Diagnosis not present

## 2021-09-06 DIAGNOSIS — M170 Bilateral primary osteoarthritis of knee: Secondary | ICD-10-CM | POA: Diagnosis not present

## 2021-09-06 DIAGNOSIS — I716 Thoracoabdominal aortic aneurysm, without rupture: Secondary | ICD-10-CM | POA: Diagnosis not present

## 2021-09-06 DIAGNOSIS — Z7984 Long term (current) use of oral hypoglycemic drugs: Secondary | ICD-10-CM | POA: Diagnosis not present

## 2021-09-11 DIAGNOSIS — Z8673 Personal history of transient ischemic attack (TIA), and cerebral infarction without residual deficits: Secondary | ICD-10-CM | POA: Diagnosis not present

## 2021-09-11 DIAGNOSIS — H53462 Homonymous bilateral field defects, left side: Secondary | ICD-10-CM | POA: Diagnosis not present

## 2021-09-11 DIAGNOSIS — M170 Bilateral primary osteoarthritis of knee: Secondary | ICD-10-CM | POA: Diagnosis not present

## 2021-09-11 DIAGNOSIS — Z7984 Long term (current) use of oral hypoglycemic drugs: Secondary | ICD-10-CM | POA: Diagnosis not present

## 2021-09-11 DIAGNOSIS — E785 Hyperlipidemia, unspecified: Secondary | ICD-10-CM | POA: Diagnosis not present

## 2021-09-11 DIAGNOSIS — M8580 Other specified disorders of bone density and structure, unspecified site: Secondary | ICD-10-CM | POA: Diagnosis not present

## 2021-09-11 DIAGNOSIS — H353230 Exudative age-related macular degeneration, bilateral, stage unspecified: Secondary | ICD-10-CM | POA: Diagnosis not present

## 2021-09-11 DIAGNOSIS — Z9181 History of falling: Secondary | ICD-10-CM | POA: Diagnosis not present

## 2021-09-11 DIAGNOSIS — E119 Type 2 diabetes mellitus without complications: Secondary | ICD-10-CM | POA: Diagnosis not present

## 2021-09-11 DIAGNOSIS — I716 Thoracoabdominal aortic aneurysm, without rupture: Secondary | ICD-10-CM | POA: Diagnosis not present

## 2021-09-11 DIAGNOSIS — S12500D Unspecified displaced fracture of sixth cervical vertebra, subsequent encounter for fracture with routine healing: Secondary | ICD-10-CM | POA: Diagnosis not present

## 2021-09-11 DIAGNOSIS — I69398 Other sequelae of cerebral infarction: Secondary | ICD-10-CM | POA: Diagnosis not present

## 2021-09-11 DIAGNOSIS — H543 Unqualified visual loss, both eyes: Secondary | ICD-10-CM | POA: Diagnosis not present

## 2021-09-11 DIAGNOSIS — R55 Syncope and collapse: Secondary | ICD-10-CM | POA: Diagnosis not present

## 2021-09-16 DIAGNOSIS — E119 Type 2 diabetes mellitus without complications: Secondary | ICD-10-CM | POA: Diagnosis not present

## 2021-09-16 DIAGNOSIS — I716 Thoracoabdominal aortic aneurysm, without rupture, unspecified: Secondary | ICD-10-CM | POA: Diagnosis not present

## 2021-09-16 DIAGNOSIS — Z7984 Long term (current) use of oral hypoglycemic drugs: Secondary | ICD-10-CM | POA: Diagnosis not present

## 2021-09-16 DIAGNOSIS — R55 Syncope and collapse: Secondary | ICD-10-CM | POA: Diagnosis not present

## 2021-09-16 DIAGNOSIS — S12500D Unspecified displaced fracture of sixth cervical vertebra, subsequent encounter for fracture with routine healing: Secondary | ICD-10-CM | POA: Diagnosis not present

## 2021-09-16 DIAGNOSIS — M8580 Other specified disorders of bone density and structure, unspecified site: Secondary | ICD-10-CM | POA: Diagnosis not present

## 2021-09-16 DIAGNOSIS — Z9181 History of falling: Secondary | ICD-10-CM | POA: Diagnosis not present

## 2021-09-16 DIAGNOSIS — H353230 Exudative age-related macular degeneration, bilateral, stage unspecified: Secondary | ICD-10-CM | POA: Diagnosis not present

## 2021-09-16 DIAGNOSIS — M170 Bilateral primary osteoarthritis of knee: Secondary | ICD-10-CM | POA: Diagnosis not present

## 2021-09-16 DIAGNOSIS — E785 Hyperlipidemia, unspecified: Secondary | ICD-10-CM | POA: Diagnosis not present

## 2021-09-24 DIAGNOSIS — Z9181 History of falling: Secondary | ICD-10-CM | POA: Diagnosis not present

## 2021-09-24 DIAGNOSIS — R55 Syncope and collapse: Secondary | ICD-10-CM | POA: Diagnosis not present

## 2021-09-24 DIAGNOSIS — I716 Thoracoabdominal aortic aneurysm, without rupture, unspecified: Secondary | ICD-10-CM | POA: Diagnosis not present

## 2021-09-24 DIAGNOSIS — H353230 Exudative age-related macular degeneration, bilateral, stage unspecified: Secondary | ICD-10-CM | POA: Diagnosis not present

## 2021-09-24 DIAGNOSIS — M170 Bilateral primary osteoarthritis of knee: Secondary | ICD-10-CM | POA: Diagnosis not present

## 2021-09-24 DIAGNOSIS — E119 Type 2 diabetes mellitus without complications: Secondary | ICD-10-CM | POA: Diagnosis not present

## 2021-09-24 DIAGNOSIS — M8580 Other specified disorders of bone density and structure, unspecified site: Secondary | ICD-10-CM | POA: Diagnosis not present

## 2021-09-24 DIAGNOSIS — E785 Hyperlipidemia, unspecified: Secondary | ICD-10-CM | POA: Diagnosis not present

## 2021-09-24 DIAGNOSIS — S12500D Unspecified displaced fracture of sixth cervical vertebra, subsequent encounter for fracture with routine healing: Secondary | ICD-10-CM | POA: Diagnosis not present

## 2021-09-24 DIAGNOSIS — Z7984 Long term (current) use of oral hypoglycemic drugs: Secondary | ICD-10-CM | POA: Diagnosis not present

## 2021-09-25 DIAGNOSIS — Z7984 Long term (current) use of oral hypoglycemic drugs: Secondary | ICD-10-CM | POA: Diagnosis not present

## 2021-09-25 DIAGNOSIS — S12500D Unspecified displaced fracture of sixth cervical vertebra, subsequent encounter for fracture with routine healing: Secondary | ICD-10-CM | POA: Diagnosis not present

## 2021-09-25 DIAGNOSIS — R55 Syncope and collapse: Secondary | ICD-10-CM | POA: Diagnosis not present

## 2021-09-25 DIAGNOSIS — Z9181 History of falling: Secondary | ICD-10-CM | POA: Diagnosis not present

## 2021-09-25 DIAGNOSIS — H353230 Exudative age-related macular degeneration, bilateral, stage unspecified: Secondary | ICD-10-CM | POA: Diagnosis not present

## 2021-09-25 DIAGNOSIS — M8580 Other specified disorders of bone density and structure, unspecified site: Secondary | ICD-10-CM | POA: Diagnosis not present

## 2021-09-25 DIAGNOSIS — I716 Thoracoabdominal aortic aneurysm, without rupture, unspecified: Secondary | ICD-10-CM | POA: Diagnosis not present

## 2021-09-25 DIAGNOSIS — E785 Hyperlipidemia, unspecified: Secondary | ICD-10-CM | POA: Diagnosis not present

## 2021-09-25 DIAGNOSIS — M170 Bilateral primary osteoarthritis of knee: Secondary | ICD-10-CM | POA: Diagnosis not present

## 2021-09-25 DIAGNOSIS — E119 Type 2 diabetes mellitus without complications: Secondary | ICD-10-CM | POA: Diagnosis not present

## 2021-09-27 DIAGNOSIS — I6389 Other cerebral infarction: Secondary | ICD-10-CM | POA: Diagnosis not present

## 2021-10-01 DIAGNOSIS — S12500D Unspecified displaced fracture of sixth cervical vertebra, subsequent encounter for fracture with routine healing: Secondary | ICD-10-CM | POA: Diagnosis not present

## 2021-10-01 DIAGNOSIS — I716 Thoracoabdominal aortic aneurysm, without rupture, unspecified: Secondary | ICD-10-CM | POA: Diagnosis not present

## 2021-10-01 DIAGNOSIS — Z9181 History of falling: Secondary | ICD-10-CM | POA: Diagnosis not present

## 2021-10-01 DIAGNOSIS — E785 Hyperlipidemia, unspecified: Secondary | ICD-10-CM | POA: Diagnosis not present

## 2021-10-01 DIAGNOSIS — E119 Type 2 diabetes mellitus without complications: Secondary | ICD-10-CM | POA: Diagnosis not present

## 2021-10-01 DIAGNOSIS — M170 Bilateral primary osteoarthritis of knee: Secondary | ICD-10-CM | POA: Diagnosis not present

## 2021-10-01 DIAGNOSIS — R55 Syncope and collapse: Secondary | ICD-10-CM | POA: Diagnosis not present

## 2021-10-01 DIAGNOSIS — H353230 Exudative age-related macular degeneration, bilateral, stage unspecified: Secondary | ICD-10-CM | POA: Diagnosis not present

## 2021-10-01 DIAGNOSIS — Z7984 Long term (current) use of oral hypoglycemic drugs: Secondary | ICD-10-CM | POA: Diagnosis not present

## 2021-10-01 DIAGNOSIS — M8580 Other specified disorders of bone density and structure, unspecified site: Secondary | ICD-10-CM | POA: Diagnosis not present

## 2021-10-03 DIAGNOSIS — M170 Bilateral primary osteoarthritis of knee: Secondary | ICD-10-CM | POA: Diagnosis not present

## 2021-10-04 DIAGNOSIS — I7121 Aneurysm of the ascending aorta, without rupture: Secondary | ICD-10-CM | POA: Diagnosis not present

## 2021-10-04 DIAGNOSIS — I712 Thoracic aortic aneurysm, without rupture, unspecified: Secondary | ICD-10-CM | POA: Diagnosis not present

## 2021-10-07 DIAGNOSIS — E1122 Type 2 diabetes mellitus with diabetic chronic kidney disease: Secondary | ICD-10-CM | POA: Diagnosis not present

## 2021-10-07 DIAGNOSIS — E7849 Other hyperlipidemia: Secondary | ICD-10-CM | POA: Diagnosis not present

## 2021-10-07 DIAGNOSIS — I10 Essential (primary) hypertension: Secondary | ICD-10-CM | POA: Diagnosis not present

## 2021-10-07 DIAGNOSIS — N183 Chronic kidney disease, stage 3 unspecified: Secondary | ICD-10-CM | POA: Diagnosis not present

## 2021-10-07 DIAGNOSIS — E782 Mixed hyperlipidemia: Secondary | ICD-10-CM | POA: Diagnosis not present

## 2021-10-07 DIAGNOSIS — E11319 Type 2 diabetes mellitus with unspecified diabetic retinopathy without macular edema: Secondary | ICD-10-CM | POA: Diagnosis not present

## 2021-10-08 DIAGNOSIS — S12500D Unspecified displaced fracture of sixth cervical vertebra, subsequent encounter for fracture with routine healing: Secondary | ICD-10-CM | POA: Diagnosis not present

## 2021-10-08 DIAGNOSIS — Z7984 Long term (current) use of oral hypoglycemic drugs: Secondary | ICD-10-CM | POA: Diagnosis not present

## 2021-10-08 DIAGNOSIS — E119 Type 2 diabetes mellitus without complications: Secondary | ICD-10-CM | POA: Diagnosis not present

## 2021-10-08 DIAGNOSIS — R55 Syncope and collapse: Secondary | ICD-10-CM | POA: Diagnosis not present

## 2021-10-08 DIAGNOSIS — Z9181 History of falling: Secondary | ICD-10-CM | POA: Diagnosis not present

## 2021-10-08 DIAGNOSIS — E785 Hyperlipidemia, unspecified: Secondary | ICD-10-CM | POA: Diagnosis not present

## 2021-10-08 DIAGNOSIS — M8580 Other specified disorders of bone density and structure, unspecified site: Secondary | ICD-10-CM | POA: Diagnosis not present

## 2021-10-08 DIAGNOSIS — I716 Thoracoabdominal aortic aneurysm, without rupture, unspecified: Secondary | ICD-10-CM | POA: Diagnosis not present

## 2021-10-08 DIAGNOSIS — H353230 Exudative age-related macular degeneration, bilateral, stage unspecified: Secondary | ICD-10-CM | POA: Diagnosis not present

## 2021-10-08 DIAGNOSIS — M170 Bilateral primary osteoarthritis of knee: Secondary | ICD-10-CM | POA: Diagnosis not present

## 2021-10-09 DIAGNOSIS — I10 Essential (primary) hypertension: Secondary | ICD-10-CM | POA: Diagnosis not present

## 2021-10-09 DIAGNOSIS — T466X5A Adverse effect of antihyperlipidemic and antiarteriosclerotic drugs, initial encounter: Secondary | ICD-10-CM | POA: Diagnosis not present

## 2021-10-09 DIAGNOSIS — E7849 Other hyperlipidemia: Secondary | ICD-10-CM | POA: Diagnosis not present

## 2021-10-09 DIAGNOSIS — Z23 Encounter for immunization: Secondary | ICD-10-CM | POA: Diagnosis not present

## 2021-10-09 DIAGNOSIS — I6930 Unspecified sequelae of cerebral infarction: Secondary | ICD-10-CM | POA: Diagnosis not present

## 2021-10-09 DIAGNOSIS — E1122 Type 2 diabetes mellitus with diabetic chronic kidney disease: Secondary | ICD-10-CM | POA: Diagnosis not present

## 2021-10-09 DIAGNOSIS — M791 Myalgia, unspecified site: Secondary | ICD-10-CM | POA: Diagnosis not present

## 2021-10-09 DIAGNOSIS — E1165 Type 2 diabetes mellitus with hyperglycemia: Secondary | ICD-10-CM | POA: Diagnosis not present

## 2021-10-10 DIAGNOSIS — S12500D Unspecified displaced fracture of sixth cervical vertebra, subsequent encounter for fracture with routine healing: Secondary | ICD-10-CM | POA: Diagnosis not present

## 2021-10-10 DIAGNOSIS — R55 Syncope and collapse: Secondary | ICD-10-CM | POA: Diagnosis not present

## 2021-10-10 DIAGNOSIS — H353230 Exudative age-related macular degeneration, bilateral, stage unspecified: Secondary | ICD-10-CM | POA: Diagnosis not present

## 2021-10-10 DIAGNOSIS — E119 Type 2 diabetes mellitus without complications: Secondary | ICD-10-CM | POA: Diagnosis not present

## 2021-10-10 DIAGNOSIS — I715 Thoracoabdominal aortic aneurysm, ruptured, unspecified: Secondary | ICD-10-CM | POA: Diagnosis not present

## 2021-10-10 DIAGNOSIS — E785 Hyperlipidemia, unspecified: Secondary | ICD-10-CM | POA: Diagnosis not present

## 2021-10-10 DIAGNOSIS — M8580 Other specified disorders of bone density and structure, unspecified site: Secondary | ICD-10-CM | POA: Diagnosis not present

## 2021-10-10 DIAGNOSIS — M170 Bilateral primary osteoarthritis of knee: Secondary | ICD-10-CM | POA: Diagnosis not present

## 2021-11-06 DIAGNOSIS — H353230 Exudative age-related macular degeneration, bilateral, stage unspecified: Secondary | ICD-10-CM | POA: Diagnosis not present

## 2021-11-06 DIAGNOSIS — I639 Cerebral infarction, unspecified: Secondary | ICD-10-CM | POA: Diagnosis not present

## 2021-11-06 DIAGNOSIS — H3530 Unspecified macular degeneration: Secondary | ICD-10-CM | POA: Diagnosis not present

## 2021-11-22 DIAGNOSIS — G5603 Carpal tunnel syndrome, bilateral upper limbs: Secondary | ICD-10-CM | POA: Diagnosis not present

## 2021-11-22 DIAGNOSIS — G5602 Carpal tunnel syndrome, left upper limb: Secondary | ICD-10-CM | POA: Diagnosis not present

## 2021-11-22 DIAGNOSIS — M179 Osteoarthritis of knee, unspecified: Secondary | ICD-10-CM | POA: Diagnosis not present

## 2021-11-22 DIAGNOSIS — G5601 Carpal tunnel syndrome, right upper limb: Secondary | ICD-10-CM | POA: Diagnosis not present

## 2021-12-04 DIAGNOSIS — I69398 Other sequelae of cerebral infarction: Secondary | ICD-10-CM | POA: Diagnosis not present

## 2021-12-04 DIAGNOSIS — H353133 Nonexudative age-related macular degeneration, bilateral, advanced atrophic without subfoveal involvement: Secondary | ICD-10-CM | POA: Diagnosis not present

## 2021-12-04 DIAGNOSIS — D3131 Benign neoplasm of right choroid: Secondary | ICD-10-CM | POA: Diagnosis not present

## 2021-12-04 DIAGNOSIS — H43813 Vitreous degeneration, bilateral: Secondary | ICD-10-CM | POA: Diagnosis not present

## 2021-12-04 DIAGNOSIS — Z961 Presence of intraocular lens: Secondary | ICD-10-CM | POA: Diagnosis not present

## 2021-12-09 DIAGNOSIS — R739 Hyperglycemia, unspecified: Secondary | ICD-10-CM | POA: Diagnosis not present

## 2021-12-09 DIAGNOSIS — I4891 Unspecified atrial fibrillation: Secondary | ICD-10-CM | POA: Diagnosis not present

## 2021-12-09 DIAGNOSIS — R4020 Unspecified coma: Secondary | ICD-10-CM | POA: Diagnosis not present

## 2021-12-09 DIAGNOSIS — W19XXXA Unspecified fall, initial encounter: Secondary | ICD-10-CM | POA: Diagnosis not present

## 2021-12-20 DIAGNOSIS — M170 Bilateral primary osteoarthritis of knee: Secondary | ICD-10-CM | POA: Diagnosis not present

## 2021-12-26 DIAGNOSIS — I639 Cerebral infarction, unspecified: Secondary | ICD-10-CM | POA: Diagnosis not present

## 2021-12-26 DIAGNOSIS — R000 Tachycardia, unspecified: Secondary | ICD-10-CM | POA: Diagnosis not present

## 2022-01-10 DIAGNOSIS — G5603 Carpal tunnel syndrome, bilateral upper limbs: Secondary | ICD-10-CM | POA: Diagnosis not present

## 2022-01-10 DIAGNOSIS — M545 Low back pain, unspecified: Secondary | ICD-10-CM | POA: Insufficient documentation

## 2022-01-10 DIAGNOSIS — G5602 Carpal tunnel syndrome, left upper limb: Secondary | ICD-10-CM | POA: Diagnosis not present

## 2022-02-03 DIAGNOSIS — E7849 Other hyperlipidemia: Secondary | ICD-10-CM | POA: Diagnosis not present

## 2022-02-03 DIAGNOSIS — E1165 Type 2 diabetes mellitus with hyperglycemia: Secondary | ICD-10-CM | POA: Diagnosis not present

## 2022-02-03 DIAGNOSIS — E782 Mixed hyperlipidemia: Secondary | ICD-10-CM | POA: Diagnosis not present

## 2022-02-03 DIAGNOSIS — N183 Chronic kidney disease, stage 3 unspecified: Secondary | ICD-10-CM | POA: Diagnosis not present

## 2022-02-03 DIAGNOSIS — I10 Essential (primary) hypertension: Secondary | ICD-10-CM | POA: Diagnosis not present

## 2022-02-06 DIAGNOSIS — E11319 Type 2 diabetes mellitus with unspecified diabetic retinopathy without macular edema: Secondary | ICD-10-CM | POA: Diagnosis not present

## 2022-02-06 DIAGNOSIS — I10 Essential (primary) hypertension: Secondary | ICD-10-CM | POA: Diagnosis not present

## 2022-02-06 DIAGNOSIS — E1165 Type 2 diabetes mellitus with hyperglycemia: Secondary | ICD-10-CM | POA: Diagnosis not present

## 2022-02-06 DIAGNOSIS — H00019 Hordeolum externum unspecified eye, unspecified eyelid: Secondary | ICD-10-CM | POA: Diagnosis not present

## 2022-02-06 DIAGNOSIS — I712 Thoracic aortic aneurysm, without rupture, unspecified: Secondary | ICD-10-CM | POA: Diagnosis not present

## 2022-02-06 DIAGNOSIS — E1122 Type 2 diabetes mellitus with diabetic chronic kidney disease: Secondary | ICD-10-CM | POA: Diagnosis not present

## 2022-02-06 DIAGNOSIS — Z789 Other specified health status: Secondary | ICD-10-CM | POA: Diagnosis not present

## 2022-02-06 DIAGNOSIS — S72002D Fracture of unspecified part of neck of left femur, subsequent encounter for closed fracture with routine healing: Secondary | ICD-10-CM | POA: Diagnosis not present

## 2022-02-11 DIAGNOSIS — E538 Deficiency of other specified B group vitamins: Secondary | ICD-10-CM | POA: Diagnosis not present

## 2022-02-18 DIAGNOSIS — E538 Deficiency of other specified B group vitamins: Secondary | ICD-10-CM | POA: Diagnosis not present

## 2022-02-25 DIAGNOSIS — E538 Deficiency of other specified B group vitamins: Secondary | ICD-10-CM | POA: Diagnosis not present

## 2022-03-04 DIAGNOSIS — E538 Deficiency of other specified B group vitamins: Secondary | ICD-10-CM | POA: Diagnosis not present

## 2022-03-05 DIAGNOSIS — H47612 Cortical blindness, left side of brain: Secondary | ICD-10-CM | POA: Diagnosis not present

## 2022-03-05 DIAGNOSIS — H47611 Cortical blindness, right side of brain: Secondary | ICD-10-CM | POA: Diagnosis not present

## 2022-03-12 DIAGNOSIS — L570 Actinic keratosis: Secondary | ICD-10-CM | POA: Diagnosis not present

## 2022-03-12 DIAGNOSIS — Z85828 Personal history of other malignant neoplasm of skin: Secondary | ICD-10-CM | POA: Diagnosis not present

## 2022-03-27 DIAGNOSIS — M170 Bilateral primary osteoarthritis of knee: Secondary | ICD-10-CM | POA: Diagnosis not present

## 2022-04-03 DIAGNOSIS — E538 Deficiency of other specified B group vitamins: Secondary | ICD-10-CM | POA: Diagnosis not present

## 2022-04-03 DIAGNOSIS — D3131 Benign neoplasm of right choroid: Secondary | ICD-10-CM | POA: Diagnosis not present

## 2022-04-03 DIAGNOSIS — H353133 Nonexudative age-related macular degeneration, bilateral, advanced atrophic without subfoveal involvement: Secondary | ICD-10-CM | POA: Diagnosis not present

## 2022-04-03 DIAGNOSIS — Z961 Presence of intraocular lens: Secondary | ICD-10-CM | POA: Diagnosis not present

## 2022-04-03 DIAGNOSIS — I639 Cerebral infarction, unspecified: Secondary | ICD-10-CM | POA: Diagnosis not present

## 2022-04-03 DIAGNOSIS — H43813 Vitreous degeneration, bilateral: Secondary | ICD-10-CM | POA: Diagnosis not present

## 2022-04-11 DIAGNOSIS — M179 Osteoarthritis of knee, unspecified: Secondary | ICD-10-CM | POA: Diagnosis not present

## 2022-04-11 DIAGNOSIS — E538 Deficiency of other specified B group vitamins: Secondary | ICD-10-CM | POA: Diagnosis not present

## 2022-04-11 DIAGNOSIS — R5383 Other fatigue: Secondary | ICD-10-CM | POA: Diagnosis not present

## 2022-04-11 DIAGNOSIS — L8990 Pressure ulcer of unspecified site, unspecified stage: Secondary | ICD-10-CM | POA: Diagnosis not present

## 2022-05-02 DIAGNOSIS — E538 Deficiency of other specified B group vitamins: Secondary | ICD-10-CM | POA: Diagnosis not present

## 2022-05-28 DIAGNOSIS — E782 Mixed hyperlipidemia: Secondary | ICD-10-CM | POA: Diagnosis not present

## 2022-05-28 DIAGNOSIS — N183 Chronic kidney disease, stage 3 unspecified: Secondary | ICD-10-CM | POA: Diagnosis not present

## 2022-05-28 DIAGNOSIS — E1165 Type 2 diabetes mellitus with hyperglycemia: Secondary | ICD-10-CM | POA: Diagnosis not present

## 2022-05-28 DIAGNOSIS — E7849 Other hyperlipidemia: Secondary | ICD-10-CM | POA: Diagnosis not present

## 2022-05-28 DIAGNOSIS — K219 Gastro-esophageal reflux disease without esophagitis: Secondary | ICD-10-CM | POA: Diagnosis not present

## 2022-05-28 DIAGNOSIS — I10 Essential (primary) hypertension: Secondary | ICD-10-CM | POA: Diagnosis not present

## 2022-05-30 DIAGNOSIS — E119 Type 2 diabetes mellitus without complications: Secondary | ICD-10-CM | POA: Diagnosis not present

## 2022-05-30 DIAGNOSIS — Z79899 Other long term (current) drug therapy: Secondary | ICD-10-CM | POA: Diagnosis not present

## 2022-05-30 DIAGNOSIS — Z7984 Long term (current) use of oral hypoglycemic drugs: Secondary | ICD-10-CM | POA: Diagnosis not present

## 2022-05-30 DIAGNOSIS — N390 Urinary tract infection, site not specified: Secondary | ICD-10-CM | POA: Diagnosis not present

## 2022-05-30 DIAGNOSIS — R1110 Vomiting, unspecified: Secondary | ICD-10-CM | POA: Diagnosis not present

## 2022-05-30 DIAGNOSIS — Z609 Problem related to social environment, unspecified: Secondary | ICD-10-CM | POA: Diagnosis not present

## 2022-05-30 DIAGNOSIS — Z885 Allergy status to narcotic agent status: Secondary | ICD-10-CM | POA: Diagnosis not present

## 2022-05-30 DIAGNOSIS — E785 Hyperlipidemia, unspecified: Secondary | ICD-10-CM | POA: Diagnosis not present

## 2022-05-30 DIAGNOSIS — Z7983 Long term (current) use of bisphosphonates: Secondary | ICD-10-CM | POA: Diagnosis not present

## 2022-05-30 DIAGNOSIS — R109 Unspecified abdominal pain: Secondary | ICD-10-CM | POA: Diagnosis not present

## 2022-05-30 DIAGNOSIS — I7121 Aneurysm of the ascending aorta, without rupture: Secondary | ICD-10-CM | POA: Diagnosis not present

## 2022-05-30 DIAGNOSIS — R1013 Epigastric pain: Secondary | ICD-10-CM | POA: Diagnosis not present

## 2022-05-30 DIAGNOSIS — X58XXXA Exposure to other specified factors, initial encounter: Secondary | ICD-10-CM | POA: Diagnosis not present

## 2022-05-30 DIAGNOSIS — S99921A Unspecified injury of right foot, initial encounter: Secondary | ICD-10-CM | POA: Diagnosis not present

## 2022-05-30 DIAGNOSIS — K5730 Diverticulosis of large intestine without perforation or abscess without bleeding: Secondary | ICD-10-CM | POA: Diagnosis not present

## 2022-05-30 DIAGNOSIS — Z880 Allergy status to penicillin: Secondary | ICD-10-CM | POA: Diagnosis not present

## 2022-05-30 DIAGNOSIS — R112 Nausea with vomiting, unspecified: Secondary | ICD-10-CM | POA: Diagnosis not present

## 2022-05-30 DIAGNOSIS — S9031XA Contusion of right foot, initial encounter: Secondary | ICD-10-CM | POA: Diagnosis not present

## 2022-05-30 DIAGNOSIS — I10 Essential (primary) hypertension: Secondary | ICD-10-CM | POA: Diagnosis not present

## 2022-05-30 DIAGNOSIS — R9431 Abnormal electrocardiogram [ECG] [EKG]: Secondary | ICD-10-CM | POA: Diagnosis not present

## 2022-05-31 DIAGNOSIS — S99921A Unspecified injury of right foot, initial encounter: Secondary | ICD-10-CM | POA: Diagnosis not present

## 2022-06-02 DIAGNOSIS — I719 Aortic aneurysm of unspecified site, without rupture: Secondary | ICD-10-CM | POA: Diagnosis not present

## 2022-06-02 DIAGNOSIS — N390 Urinary tract infection, site not specified: Secondary | ICD-10-CM | POA: Diagnosis not present

## 2022-06-02 DIAGNOSIS — R110 Nausea: Secondary | ICD-10-CM | POA: Diagnosis not present

## 2022-06-02 DIAGNOSIS — R531 Weakness: Secondary | ICD-10-CM | POA: Diagnosis not present

## 2022-06-02 DIAGNOSIS — M170 Bilateral primary osteoarthritis of knee: Secondary | ICD-10-CM | POA: Diagnosis not present

## 2022-06-02 DIAGNOSIS — Z880 Allergy status to penicillin: Secondary | ICD-10-CM | POA: Diagnosis not present

## 2022-06-02 DIAGNOSIS — Z885 Allergy status to narcotic agent status: Secondary | ICD-10-CM | POA: Diagnosis not present

## 2022-06-02 DIAGNOSIS — R54 Age-related physical debility: Secondary | ICD-10-CM | POA: Diagnosis not present

## 2022-06-02 DIAGNOSIS — M160 Bilateral primary osteoarthritis of hip: Secondary | ICD-10-CM | POA: Diagnosis not present

## 2022-06-02 DIAGNOSIS — Z79899 Other long term (current) drug therapy: Secondary | ICD-10-CM | POA: Diagnosis not present

## 2022-06-02 DIAGNOSIS — I4891 Unspecified atrial fibrillation: Secondary | ICD-10-CM | POA: Diagnosis not present

## 2022-06-02 DIAGNOSIS — I712 Thoracic aortic aneurysm, without rupture, unspecified: Secondary | ICD-10-CM | POA: Diagnosis not present

## 2022-06-02 DIAGNOSIS — R55 Syncope and collapse: Secondary | ICD-10-CM | POA: Diagnosis not present

## 2022-06-02 DIAGNOSIS — E871 Hypo-osmolality and hyponatremia: Secondary | ICD-10-CM | POA: Diagnosis not present

## 2022-06-02 DIAGNOSIS — Z7983 Long term (current) use of bisphosphonates: Secondary | ICD-10-CM | POA: Diagnosis not present

## 2022-06-02 DIAGNOSIS — I10 Essential (primary) hypertension: Secondary | ICD-10-CM | POA: Diagnosis not present

## 2022-06-02 DIAGNOSIS — E785 Hyperlipidemia, unspecified: Secondary | ICD-10-CM | POA: Diagnosis not present

## 2022-06-02 DIAGNOSIS — E119 Type 2 diabetes mellitus without complications: Secondary | ICD-10-CM | POA: Diagnosis not present

## 2022-06-02 DIAGNOSIS — Z20822 Contact with and (suspected) exposure to covid-19: Secondary | ICD-10-CM | POA: Diagnosis not present

## 2022-06-02 DIAGNOSIS — Z7984 Long term (current) use of oral hypoglycemic drugs: Secondary | ICD-10-CM | POA: Diagnosis not present

## 2022-06-02 DIAGNOSIS — Z9181 History of falling: Secondary | ICD-10-CM | POA: Diagnosis not present

## 2022-06-02 DIAGNOSIS — I493 Ventricular premature depolarization: Secondary | ICD-10-CM | POA: Diagnosis not present

## 2022-06-02 DIAGNOSIS — E878 Other disorders of electrolyte and fluid balance, not elsewhere classified: Secondary | ICD-10-CM | POA: Diagnosis not present

## 2022-06-02 DIAGNOSIS — I440 Atrioventricular block, first degree: Secondary | ICD-10-CM | POA: Diagnosis not present

## 2022-06-03 DIAGNOSIS — N390 Urinary tract infection, site not specified: Secondary | ICD-10-CM | POA: Diagnosis not present

## 2022-06-03 DIAGNOSIS — E871 Hypo-osmolality and hyponatremia: Secondary | ICD-10-CM | POA: Diagnosis not present

## 2022-06-03 DIAGNOSIS — Z792 Long term (current) use of antibiotics: Secondary | ICD-10-CM | POA: Diagnosis not present

## 2022-06-03 DIAGNOSIS — I712 Thoracic aortic aneurysm, without rupture, unspecified: Secondary | ICD-10-CM | POA: Diagnosis not present

## 2022-06-03 DIAGNOSIS — R54 Age-related physical debility: Secondary | ICD-10-CM | POA: Diagnosis not present

## 2022-06-03 DIAGNOSIS — I719 Aortic aneurysm of unspecified site, without rupture: Secondary | ICD-10-CM | POA: Diagnosis not present

## 2022-06-03 DIAGNOSIS — Z7984 Long term (current) use of oral hypoglycemic drugs: Secondary | ICD-10-CM | POA: Diagnosis not present

## 2022-06-03 DIAGNOSIS — E119 Type 2 diabetes mellitus without complications: Secondary | ICD-10-CM | POA: Diagnosis not present

## 2022-06-03 DIAGNOSIS — R55 Syncope and collapse: Secondary | ICD-10-CM | POA: Diagnosis not present

## 2022-06-04 DIAGNOSIS — E871 Hypo-osmolality and hyponatremia: Secondary | ICD-10-CM | POA: Diagnosis not present

## 2022-06-04 DIAGNOSIS — R55 Syncope and collapse: Secondary | ICD-10-CM | POA: Diagnosis not present

## 2022-06-04 DIAGNOSIS — E119 Type 2 diabetes mellitus without complications: Secondary | ICD-10-CM | POA: Diagnosis not present

## 2022-06-04 DIAGNOSIS — Z79899 Other long term (current) drug therapy: Secondary | ICD-10-CM | POA: Diagnosis not present

## 2022-06-04 DIAGNOSIS — Z7984 Long term (current) use of oral hypoglycemic drugs: Secondary | ICD-10-CM | POA: Diagnosis not present

## 2022-06-04 DIAGNOSIS — Z792 Long term (current) use of antibiotics: Secondary | ICD-10-CM | POA: Diagnosis not present

## 2022-06-04 DIAGNOSIS — R54 Age-related physical debility: Secondary | ICD-10-CM | POA: Diagnosis not present

## 2022-06-04 DIAGNOSIS — I712 Thoracic aortic aneurysm, without rupture, unspecified: Secondary | ICD-10-CM | POA: Diagnosis not present

## 2022-06-04 DIAGNOSIS — N390 Urinary tract infection, site not specified: Secondary | ICD-10-CM | POA: Diagnosis not present

## 2022-06-05 DIAGNOSIS — Z79899 Other long term (current) drug therapy: Secondary | ICD-10-CM | POA: Diagnosis not present

## 2022-06-05 DIAGNOSIS — E871 Hypo-osmolality and hyponatremia: Secondary | ICD-10-CM | POA: Diagnosis not present

## 2022-06-05 DIAGNOSIS — E119 Type 2 diabetes mellitus without complications: Secondary | ICD-10-CM | POA: Diagnosis not present

## 2022-06-05 DIAGNOSIS — I712 Thoracic aortic aneurysm, without rupture, unspecified: Secondary | ICD-10-CM | POA: Diagnosis not present

## 2022-06-05 DIAGNOSIS — Z792 Long term (current) use of antibiotics: Secondary | ICD-10-CM | POA: Diagnosis not present

## 2022-06-05 DIAGNOSIS — R55 Syncope and collapse: Secondary | ICD-10-CM | POA: Diagnosis not present

## 2022-06-05 DIAGNOSIS — N390 Urinary tract infection, site not specified: Secondary | ICD-10-CM | POA: Diagnosis not present

## 2022-06-05 DIAGNOSIS — R54 Age-related physical debility: Secondary | ICD-10-CM | POA: Diagnosis not present

## 2022-06-12 DIAGNOSIS — I10 Essential (primary) hypertension: Secondary | ICD-10-CM | POA: Diagnosis not present

## 2022-06-12 DIAGNOSIS — E871 Hypo-osmolality and hyponatremia: Secondary | ICD-10-CM | POA: Diagnosis not present

## 2022-06-12 DIAGNOSIS — E119 Type 2 diabetes mellitus without complications: Secondary | ICD-10-CM | POA: Diagnosis not present

## 2022-06-12 DIAGNOSIS — I440 Atrioventricular block, first degree: Secondary | ICD-10-CM | POA: Diagnosis not present

## 2022-06-12 DIAGNOSIS — Z981 Arthrodesis status: Secondary | ICD-10-CM | POA: Diagnosis not present

## 2022-06-12 DIAGNOSIS — E785 Hyperlipidemia, unspecified: Secondary | ICD-10-CM | POA: Diagnosis not present

## 2022-06-12 DIAGNOSIS — Z7984 Long term (current) use of oral hypoglycemic drugs: Secondary | ICD-10-CM | POA: Diagnosis not present

## 2022-06-12 DIAGNOSIS — I712 Thoracic aortic aneurysm, without rupture, unspecified: Secondary | ICD-10-CM | POA: Diagnosis not present

## 2022-06-12 DIAGNOSIS — M150 Primary generalized (osteo)arthritis: Secondary | ICD-10-CM | POA: Diagnosis not present

## 2022-06-12 DIAGNOSIS — Z9181 History of falling: Secondary | ICD-10-CM | POA: Diagnosis not present

## 2022-06-12 DIAGNOSIS — N390 Urinary tract infection, site not specified: Secondary | ICD-10-CM | POA: Diagnosis not present

## 2022-06-13 DIAGNOSIS — E538 Deficiency of other specified B group vitamins: Secondary | ICD-10-CM | POA: Diagnosis not present

## 2022-06-13 DIAGNOSIS — R350 Frequency of micturition: Secondary | ICD-10-CM | POA: Diagnosis not present

## 2022-06-13 DIAGNOSIS — W19XXXA Unspecified fall, initial encounter: Secondary | ICD-10-CM | POA: Diagnosis not present

## 2022-06-13 DIAGNOSIS — E871 Hypo-osmolality and hyponatremia: Secondary | ICD-10-CM | POA: Diagnosis not present

## 2022-06-13 DIAGNOSIS — M810 Age-related osteoporosis without current pathological fracture: Secondary | ICD-10-CM | POA: Diagnosis not present

## 2022-06-13 DIAGNOSIS — D649 Anemia, unspecified: Secondary | ICD-10-CM | POA: Diagnosis not present

## 2022-06-13 DIAGNOSIS — E782 Mixed hyperlipidemia: Secondary | ICD-10-CM | POA: Diagnosis not present

## 2022-06-13 DIAGNOSIS — R55 Syncope and collapse: Secondary | ICD-10-CM | POA: Diagnosis not present

## 2022-06-13 DIAGNOSIS — E1122 Type 2 diabetes mellitus with diabetic chronic kidney disease: Secondary | ICD-10-CM | POA: Diagnosis not present

## 2022-06-13 DIAGNOSIS — E1165 Type 2 diabetes mellitus with hyperglycemia: Secondary | ICD-10-CM | POA: Diagnosis not present

## 2022-06-24 DIAGNOSIS — I4891 Unspecified atrial fibrillation: Secondary | ICD-10-CM | POA: Diagnosis not present

## 2022-06-25 DIAGNOSIS — M150 Primary generalized (osteo)arthritis: Secondary | ICD-10-CM | POA: Diagnosis not present

## 2022-06-25 DIAGNOSIS — I10 Essential (primary) hypertension: Secondary | ICD-10-CM | POA: Diagnosis not present

## 2022-06-25 DIAGNOSIS — I440 Atrioventricular block, first degree: Secondary | ICD-10-CM | POA: Diagnosis not present

## 2022-06-25 DIAGNOSIS — N390 Urinary tract infection, site not specified: Secondary | ICD-10-CM | POA: Diagnosis not present

## 2022-06-25 DIAGNOSIS — Z9181 History of falling: Secondary | ICD-10-CM | POA: Diagnosis not present

## 2022-06-25 DIAGNOSIS — E785 Hyperlipidemia, unspecified: Secondary | ICD-10-CM | POA: Diagnosis not present

## 2022-06-25 DIAGNOSIS — E871 Hypo-osmolality and hyponatremia: Secondary | ICD-10-CM | POA: Diagnosis not present

## 2022-06-25 DIAGNOSIS — Z7984 Long term (current) use of oral hypoglycemic drugs: Secondary | ICD-10-CM | POA: Diagnosis not present

## 2022-06-25 DIAGNOSIS — E119 Type 2 diabetes mellitus without complications: Secondary | ICD-10-CM | POA: Diagnosis not present

## 2022-06-25 DIAGNOSIS — Z981 Arthrodesis status: Secondary | ICD-10-CM | POA: Diagnosis not present

## 2022-06-25 DIAGNOSIS — I712 Thoracic aortic aneurysm, without rupture, unspecified: Secondary | ICD-10-CM | POA: Diagnosis not present

## 2022-06-26 DIAGNOSIS — M170 Bilateral primary osteoarthritis of knee: Secondary | ICD-10-CM | POA: Diagnosis not present

## 2022-06-27 DIAGNOSIS — N390 Urinary tract infection, site not specified: Secondary | ICD-10-CM | POA: Diagnosis not present

## 2022-06-27 DIAGNOSIS — I712 Thoracic aortic aneurysm, without rupture, unspecified: Secondary | ICD-10-CM | POA: Diagnosis not present

## 2022-06-27 DIAGNOSIS — Z7984 Long term (current) use of oral hypoglycemic drugs: Secondary | ICD-10-CM | POA: Diagnosis not present

## 2022-06-27 DIAGNOSIS — E871 Hypo-osmolality and hyponatremia: Secondary | ICD-10-CM | POA: Diagnosis not present

## 2022-06-27 DIAGNOSIS — I440 Atrioventricular block, first degree: Secondary | ICD-10-CM | POA: Diagnosis not present

## 2022-06-27 DIAGNOSIS — Z981 Arthrodesis status: Secondary | ICD-10-CM | POA: Diagnosis not present

## 2022-06-27 DIAGNOSIS — E785 Hyperlipidemia, unspecified: Secondary | ICD-10-CM | POA: Diagnosis not present

## 2022-06-27 DIAGNOSIS — M150 Primary generalized (osteo)arthritis: Secondary | ICD-10-CM | POA: Diagnosis not present

## 2022-06-27 DIAGNOSIS — Z9181 History of falling: Secondary | ICD-10-CM | POA: Diagnosis not present

## 2022-06-27 DIAGNOSIS — I10 Essential (primary) hypertension: Secondary | ICD-10-CM | POA: Diagnosis not present

## 2022-06-27 DIAGNOSIS — E119 Type 2 diabetes mellitus without complications: Secondary | ICD-10-CM | POA: Diagnosis not present

## 2022-06-30 DIAGNOSIS — Z9181 History of falling: Secondary | ICD-10-CM | POA: Diagnosis not present

## 2022-06-30 DIAGNOSIS — E785 Hyperlipidemia, unspecified: Secondary | ICD-10-CM | POA: Diagnosis not present

## 2022-06-30 DIAGNOSIS — Z981 Arthrodesis status: Secondary | ICD-10-CM | POA: Diagnosis not present

## 2022-06-30 DIAGNOSIS — I440 Atrioventricular block, first degree: Secondary | ICD-10-CM | POA: Diagnosis not present

## 2022-06-30 DIAGNOSIS — I10 Essential (primary) hypertension: Secondary | ICD-10-CM | POA: Diagnosis not present

## 2022-06-30 DIAGNOSIS — N390 Urinary tract infection, site not specified: Secondary | ICD-10-CM | POA: Diagnosis not present

## 2022-06-30 DIAGNOSIS — E119 Type 2 diabetes mellitus without complications: Secondary | ICD-10-CM | POA: Diagnosis not present

## 2022-06-30 DIAGNOSIS — I712 Thoracic aortic aneurysm, without rupture, unspecified: Secondary | ICD-10-CM | POA: Diagnosis not present

## 2022-06-30 DIAGNOSIS — Z7984 Long term (current) use of oral hypoglycemic drugs: Secondary | ICD-10-CM | POA: Diagnosis not present

## 2022-06-30 DIAGNOSIS — E871 Hypo-osmolality and hyponatremia: Secondary | ICD-10-CM | POA: Diagnosis not present

## 2022-06-30 DIAGNOSIS — M150 Primary generalized (osteo)arthritis: Secondary | ICD-10-CM | POA: Diagnosis not present

## 2022-07-03 DIAGNOSIS — H353133 Nonexudative age-related macular degeneration, bilateral, advanced atrophic without subfoveal involvement: Secondary | ICD-10-CM | POA: Diagnosis not present

## 2022-07-03 DIAGNOSIS — Z961 Presence of intraocular lens: Secondary | ICD-10-CM | POA: Diagnosis not present

## 2022-07-03 DIAGNOSIS — H43813 Vitreous degeneration, bilateral: Secondary | ICD-10-CM | POA: Diagnosis not present

## 2022-07-03 DIAGNOSIS — I639 Cerebral infarction, unspecified: Secondary | ICD-10-CM | POA: Diagnosis not present

## 2022-07-03 DIAGNOSIS — D3131 Benign neoplasm of right choroid: Secondary | ICD-10-CM | POA: Diagnosis not present

## 2022-07-04 DIAGNOSIS — R350 Frequency of micturition: Secondary | ICD-10-CM | POA: Diagnosis not present

## 2022-07-04 DIAGNOSIS — Z981 Arthrodesis status: Secondary | ICD-10-CM | POA: Diagnosis not present

## 2022-07-04 DIAGNOSIS — R55 Syncope and collapse: Secondary | ICD-10-CM | POA: Diagnosis not present

## 2022-07-04 DIAGNOSIS — Z6822 Body mass index (BMI) 22.0-22.9, adult: Secondary | ICD-10-CM | POA: Diagnosis not present

## 2022-07-04 DIAGNOSIS — N390 Urinary tract infection, site not specified: Secondary | ICD-10-CM | POA: Diagnosis not present

## 2022-07-04 DIAGNOSIS — E871 Hypo-osmolality and hyponatremia: Secondary | ICD-10-CM | POA: Diagnosis not present

## 2022-07-04 DIAGNOSIS — M150 Primary generalized (osteo)arthritis: Secondary | ICD-10-CM | POA: Diagnosis not present

## 2022-07-04 DIAGNOSIS — I440 Atrioventricular block, first degree: Secondary | ICD-10-CM | POA: Diagnosis not present

## 2022-07-04 DIAGNOSIS — Z9181 History of falling: Secondary | ICD-10-CM | POA: Diagnosis not present

## 2022-07-04 DIAGNOSIS — W19XXXA Unspecified fall, initial encounter: Secondary | ICD-10-CM | POA: Diagnosis not present

## 2022-07-04 DIAGNOSIS — E119 Type 2 diabetes mellitus without complications: Secondary | ICD-10-CM | POA: Diagnosis not present

## 2022-07-04 DIAGNOSIS — I712 Thoracic aortic aneurysm, without rupture, unspecified: Secondary | ICD-10-CM | POA: Diagnosis not present

## 2022-07-04 DIAGNOSIS — I10 Essential (primary) hypertension: Secondary | ICD-10-CM | POA: Diagnosis not present

## 2022-07-04 DIAGNOSIS — Z7984 Long term (current) use of oral hypoglycemic drugs: Secondary | ICD-10-CM | POA: Diagnosis not present

## 2022-07-04 DIAGNOSIS — E785 Hyperlipidemia, unspecified: Secondary | ICD-10-CM | POA: Diagnosis not present

## 2022-07-04 DIAGNOSIS — E1165 Type 2 diabetes mellitus with hyperglycemia: Secondary | ICD-10-CM | POA: Diagnosis not present

## 2022-07-07 DIAGNOSIS — Z7984 Long term (current) use of oral hypoglycemic drugs: Secondary | ICD-10-CM | POA: Diagnosis not present

## 2022-07-07 DIAGNOSIS — Z9181 History of falling: Secondary | ICD-10-CM | POA: Diagnosis not present

## 2022-07-07 DIAGNOSIS — Z981 Arthrodesis status: Secondary | ICD-10-CM | POA: Diagnosis not present

## 2022-07-07 DIAGNOSIS — E785 Hyperlipidemia, unspecified: Secondary | ICD-10-CM | POA: Diagnosis not present

## 2022-07-07 DIAGNOSIS — I440 Atrioventricular block, first degree: Secondary | ICD-10-CM | POA: Diagnosis not present

## 2022-07-07 DIAGNOSIS — I712 Thoracic aortic aneurysm, without rupture, unspecified: Secondary | ICD-10-CM | POA: Diagnosis not present

## 2022-07-07 DIAGNOSIS — N390 Urinary tract infection, site not specified: Secondary | ICD-10-CM | POA: Diagnosis not present

## 2022-07-07 DIAGNOSIS — E871 Hypo-osmolality and hyponatremia: Secondary | ICD-10-CM | POA: Diagnosis not present

## 2022-07-07 DIAGNOSIS — I10 Essential (primary) hypertension: Secondary | ICD-10-CM | POA: Diagnosis not present

## 2022-07-07 DIAGNOSIS — E119 Type 2 diabetes mellitus without complications: Secondary | ICD-10-CM | POA: Diagnosis not present

## 2022-07-07 DIAGNOSIS — M150 Primary generalized (osteo)arthritis: Secondary | ICD-10-CM | POA: Diagnosis not present

## 2022-07-11 DIAGNOSIS — Z9181 History of falling: Secondary | ICD-10-CM | POA: Diagnosis not present

## 2022-07-11 DIAGNOSIS — Z7984 Long term (current) use of oral hypoglycemic drugs: Secondary | ICD-10-CM | POA: Diagnosis not present

## 2022-07-11 DIAGNOSIS — Z981 Arthrodesis status: Secondary | ICD-10-CM | POA: Diagnosis not present

## 2022-07-11 DIAGNOSIS — M150 Primary generalized (osteo)arthritis: Secondary | ICD-10-CM | POA: Diagnosis not present

## 2022-07-11 DIAGNOSIS — E871 Hypo-osmolality and hyponatremia: Secondary | ICD-10-CM | POA: Diagnosis not present

## 2022-07-11 DIAGNOSIS — E785 Hyperlipidemia, unspecified: Secondary | ICD-10-CM | POA: Diagnosis not present

## 2022-07-11 DIAGNOSIS — N390 Urinary tract infection, site not specified: Secondary | ICD-10-CM | POA: Diagnosis not present

## 2022-07-11 DIAGNOSIS — I440 Atrioventricular block, first degree: Secondary | ICD-10-CM | POA: Diagnosis not present

## 2022-07-11 DIAGNOSIS — I712 Thoracic aortic aneurysm, without rupture, unspecified: Secondary | ICD-10-CM | POA: Diagnosis not present

## 2022-07-11 DIAGNOSIS — E119 Type 2 diabetes mellitus without complications: Secondary | ICD-10-CM | POA: Diagnosis not present

## 2022-07-11 DIAGNOSIS — I10 Essential (primary) hypertension: Secondary | ICD-10-CM | POA: Diagnosis not present

## 2022-07-14 DIAGNOSIS — E538 Deficiency of other specified B group vitamins: Secondary | ICD-10-CM | POA: Diagnosis not present

## 2022-07-15 DIAGNOSIS — E785 Hyperlipidemia, unspecified: Secondary | ICD-10-CM | POA: Diagnosis not present

## 2022-07-15 DIAGNOSIS — I712 Thoracic aortic aneurysm, without rupture, unspecified: Secondary | ICD-10-CM | POA: Diagnosis not present

## 2022-07-15 DIAGNOSIS — E119 Type 2 diabetes mellitus without complications: Secondary | ICD-10-CM | POA: Diagnosis not present

## 2022-07-15 DIAGNOSIS — E871 Hypo-osmolality and hyponatremia: Secondary | ICD-10-CM | POA: Diagnosis not present

## 2022-07-15 DIAGNOSIS — I440 Atrioventricular block, first degree: Secondary | ICD-10-CM | POA: Diagnosis not present

## 2022-07-15 DIAGNOSIS — I10 Essential (primary) hypertension: Secondary | ICD-10-CM | POA: Diagnosis not present

## 2022-07-15 DIAGNOSIS — M150 Primary generalized (osteo)arthritis: Secondary | ICD-10-CM | POA: Diagnosis not present

## 2022-07-15 DIAGNOSIS — N390 Urinary tract infection, site not specified: Secondary | ICD-10-CM | POA: Diagnosis not present

## 2022-07-17 DIAGNOSIS — Z6822 Body mass index (BMI) 22.0-22.9, adult: Secondary | ICD-10-CM | POA: Diagnosis not present

## 2022-07-17 DIAGNOSIS — E11622 Type 2 diabetes mellitus with other skin ulcer: Secondary | ICD-10-CM | POA: Diagnosis not present

## 2022-07-17 DIAGNOSIS — N390 Urinary tract infection, site not specified: Secondary | ICD-10-CM | POA: Diagnosis not present

## 2022-07-17 DIAGNOSIS — L8991 Pressure ulcer of unspecified site, stage 1: Secondary | ICD-10-CM | POA: Diagnosis not present

## 2022-07-19 DIAGNOSIS — R1110 Vomiting, unspecified: Secondary | ICD-10-CM | POA: Diagnosis not present

## 2022-07-19 DIAGNOSIS — N3090 Cystitis, unspecified without hematuria: Secondary | ICD-10-CM | POA: Diagnosis not present

## 2022-07-19 DIAGNOSIS — R109 Unspecified abdominal pain: Secondary | ICD-10-CM | POA: Diagnosis not present

## 2022-07-19 DIAGNOSIS — Z6821 Body mass index (BMI) 21.0-21.9, adult: Secondary | ICD-10-CM | POA: Diagnosis not present

## 2022-07-21 DIAGNOSIS — E782 Mixed hyperlipidemia: Secondary | ICD-10-CM | POA: Diagnosis not present

## 2022-07-21 DIAGNOSIS — D649 Anemia, unspecified: Secondary | ICD-10-CM | POA: Diagnosis not present

## 2022-07-21 DIAGNOSIS — K219 Gastro-esophageal reflux disease without esophagitis: Secondary | ICD-10-CM | POA: Diagnosis not present

## 2022-07-21 DIAGNOSIS — R1110 Vomiting, unspecified: Secondary | ICD-10-CM | POA: Diagnosis not present

## 2022-07-21 DIAGNOSIS — E1165 Type 2 diabetes mellitus with hyperglycemia: Secondary | ICD-10-CM | POA: Diagnosis not present

## 2022-07-21 DIAGNOSIS — E871 Hypo-osmolality and hyponatremia: Secondary | ICD-10-CM | POA: Diagnosis not present

## 2022-07-21 DIAGNOSIS — I10 Essential (primary) hypertension: Secondary | ICD-10-CM | POA: Diagnosis not present

## 2022-07-21 DIAGNOSIS — R5383 Other fatigue: Secondary | ICD-10-CM | POA: Diagnosis not present

## 2022-07-21 DIAGNOSIS — E7849 Other hyperlipidemia: Secondary | ICD-10-CM | POA: Diagnosis not present

## 2022-07-21 DIAGNOSIS — N189 Chronic kidney disease, unspecified: Secondary | ICD-10-CM | POA: Diagnosis not present

## 2022-07-22 DIAGNOSIS — E1165 Type 2 diabetes mellitus with hyperglycemia: Secondary | ICD-10-CM | POA: Diagnosis not present

## 2022-07-22 DIAGNOSIS — E871 Hypo-osmolality and hyponatremia: Secondary | ICD-10-CM | POA: Diagnosis not present

## 2022-07-22 DIAGNOSIS — N3090 Cystitis, unspecified without hematuria: Secondary | ICD-10-CM | POA: Diagnosis not present

## 2022-07-22 DIAGNOSIS — R5383 Other fatigue: Secondary | ICD-10-CM | POA: Diagnosis not present

## 2022-07-22 DIAGNOSIS — R030 Elevated blood-pressure reading, without diagnosis of hypertension: Secondary | ICD-10-CM | POA: Diagnosis not present

## 2022-07-22 DIAGNOSIS — Z6821 Body mass index (BMI) 21.0-21.9, adult: Secondary | ICD-10-CM | POA: Diagnosis not present

## 2022-07-22 DIAGNOSIS — D72829 Elevated white blood cell count, unspecified: Secondary | ICD-10-CM | POA: Diagnosis not present

## 2022-07-23 DIAGNOSIS — N3090 Cystitis, unspecified without hematuria: Secondary | ICD-10-CM | POA: Diagnosis not present

## 2022-07-23 DIAGNOSIS — R5383 Other fatigue: Secondary | ICD-10-CM | POA: Diagnosis not present

## 2022-07-25 DIAGNOSIS — E11319 Type 2 diabetes mellitus with unspecified diabetic retinopathy without macular edema: Secondary | ICD-10-CM | POA: Diagnosis not present

## 2022-07-25 DIAGNOSIS — E7849 Other hyperlipidemia: Secondary | ICD-10-CM | POA: Diagnosis not present

## 2022-07-25 DIAGNOSIS — D72829 Elevated white blood cell count, unspecified: Secondary | ICD-10-CM | POA: Diagnosis not present

## 2022-07-25 DIAGNOSIS — D649 Anemia, unspecified: Secondary | ICD-10-CM | POA: Diagnosis not present

## 2022-07-25 DIAGNOSIS — I10 Essential (primary) hypertension: Secondary | ICD-10-CM | POA: Diagnosis not present

## 2022-07-25 DIAGNOSIS — E871 Hypo-osmolality and hyponatremia: Secondary | ICD-10-CM | POA: Diagnosis not present

## 2022-07-25 DIAGNOSIS — R5383 Other fatigue: Secondary | ICD-10-CM | POA: Diagnosis not present

## 2022-07-25 DIAGNOSIS — N183 Chronic kidney disease, stage 3 unspecified: Secondary | ICD-10-CM | POA: Diagnosis not present

## 2022-08-14 DIAGNOSIS — E538 Deficiency of other specified B group vitamins: Secondary | ICD-10-CM | POA: Diagnosis not present

## 2022-09-03 DIAGNOSIS — E782 Mixed hyperlipidemia: Secondary | ICD-10-CM | POA: Diagnosis not present

## 2022-09-03 DIAGNOSIS — D649 Anemia, unspecified: Secondary | ICD-10-CM | POA: Diagnosis not present

## 2022-09-03 DIAGNOSIS — D519 Vitamin B12 deficiency anemia, unspecified: Secondary | ICD-10-CM | POA: Diagnosis not present

## 2022-09-03 DIAGNOSIS — K219 Gastro-esophageal reflux disease without esophagitis: Secondary | ICD-10-CM | POA: Diagnosis not present

## 2022-09-03 DIAGNOSIS — E7849 Other hyperlipidemia: Secondary | ICD-10-CM | POA: Diagnosis not present

## 2022-09-03 DIAGNOSIS — E1165 Type 2 diabetes mellitus with hyperglycemia: Secondary | ICD-10-CM | POA: Diagnosis not present

## 2022-09-03 DIAGNOSIS — D529 Folate deficiency anemia, unspecified: Secondary | ICD-10-CM | POA: Diagnosis not present

## 2022-09-03 DIAGNOSIS — I10 Essential (primary) hypertension: Secondary | ICD-10-CM | POA: Diagnosis not present

## 2022-09-09 DIAGNOSIS — Z789 Other specified health status: Secondary | ICD-10-CM | POA: Diagnosis not present

## 2022-09-09 DIAGNOSIS — D519 Vitamin B12 deficiency anemia, unspecified: Secondary | ICD-10-CM | POA: Diagnosis not present

## 2022-09-09 DIAGNOSIS — H5462 Unqualified visual loss, left eye, normal vision right eye: Secondary | ICD-10-CM | POA: Diagnosis not present

## 2022-09-09 DIAGNOSIS — I712 Thoracic aortic aneurysm, without rupture, unspecified: Secondary | ICD-10-CM | POA: Diagnosis not present

## 2022-09-09 DIAGNOSIS — E11319 Type 2 diabetes mellitus with unspecified diabetic retinopathy without macular edema: Secondary | ICD-10-CM | POA: Diagnosis not present

## 2022-09-09 DIAGNOSIS — E1165 Type 2 diabetes mellitus with hyperglycemia: Secondary | ICD-10-CM | POA: Diagnosis not present

## 2022-09-09 DIAGNOSIS — Z23 Encounter for immunization: Secondary | ICD-10-CM | POA: Diagnosis not present

## 2022-09-09 DIAGNOSIS — M791 Myalgia, unspecified site: Secondary | ICD-10-CM | POA: Diagnosis not present

## 2022-09-09 DIAGNOSIS — H00019 Hordeolum externum unspecified eye, unspecified eyelid: Secondary | ICD-10-CM | POA: Diagnosis not present

## 2022-09-09 DIAGNOSIS — E1122 Type 2 diabetes mellitus with diabetic chronic kidney disease: Secondary | ICD-10-CM | POA: Diagnosis not present

## 2022-09-09 DIAGNOSIS — I6930 Unspecified sequelae of cerebral infarction: Secondary | ICD-10-CM | POA: Diagnosis not present

## 2022-09-09 DIAGNOSIS — E7849 Other hyperlipidemia: Secondary | ICD-10-CM | POA: Diagnosis not present

## 2022-10-02 DIAGNOSIS — Z961 Presence of intraocular lens: Secondary | ICD-10-CM | POA: Diagnosis not present

## 2022-10-02 DIAGNOSIS — H43813 Vitreous degeneration, bilateral: Secondary | ICD-10-CM | POA: Diagnosis not present

## 2022-10-02 DIAGNOSIS — H3589 Other specified retinal disorders: Secondary | ICD-10-CM | POA: Diagnosis not present

## 2022-10-02 DIAGNOSIS — H353133 Nonexudative age-related macular degeneration, bilateral, advanced atrophic without subfoveal involvement: Secondary | ICD-10-CM | POA: Diagnosis not present

## 2022-10-02 DIAGNOSIS — D3131 Benign neoplasm of right choroid: Secondary | ICD-10-CM | POA: Diagnosis not present

## 2022-10-02 DIAGNOSIS — I639 Cerebral infarction, unspecified: Secondary | ICD-10-CM | POA: Diagnosis not present

## 2022-10-07 DIAGNOSIS — M170 Bilateral primary osteoarthritis of knee: Secondary | ICD-10-CM | POA: Diagnosis not present

## 2022-10-09 DIAGNOSIS — E538 Deficiency of other specified B group vitamins: Secondary | ICD-10-CM | POA: Diagnosis not present

## 2022-10-28 DIAGNOSIS — M170 Bilateral primary osteoarthritis of knee: Secondary | ICD-10-CM | POA: Diagnosis not present

## 2022-11-07 DIAGNOSIS — R030 Elevated blood-pressure reading, without diagnosis of hypertension: Secondary | ICD-10-CM | POA: Diagnosis not present

## 2022-11-07 DIAGNOSIS — N3090 Cystitis, unspecified without hematuria: Secondary | ICD-10-CM | POA: Diagnosis not present

## 2022-11-07 DIAGNOSIS — R531 Weakness: Secondary | ICD-10-CM | POA: Diagnosis not present

## 2022-11-07 DIAGNOSIS — E538 Deficiency of other specified B group vitamins: Secondary | ICD-10-CM | POA: Diagnosis not present

## 2022-11-07 DIAGNOSIS — R5383 Other fatigue: Secondary | ICD-10-CM | POA: Diagnosis not present

## 2022-12-05 DIAGNOSIS — E538 Deficiency of other specified B group vitamins: Secondary | ICD-10-CM | POA: Diagnosis not present

## 2022-12-11 ENCOUNTER — Other Ambulatory Visit: Payer: Self-pay

## 2022-12-11 ENCOUNTER — Observation Stay (HOSPITAL_COMMUNITY)
Admission: EM | Admit: 2022-12-11 | Discharge: 2022-12-16 | Disposition: A | Discharge status: Skilled Nursing Facility | Payer: Medicare Other | Attending: Internal Medicine | Admitting: Internal Medicine

## 2022-12-11 ENCOUNTER — Encounter (HOSPITAL_COMMUNITY): Payer: Self-pay

## 2022-12-11 ENCOUNTER — Emergency Department (HOSPITAL_COMMUNITY): Payer: Medicare Other

## 2022-12-11 DIAGNOSIS — E871 Hypo-osmolality and hyponatremia: Secondary | ICD-10-CM | POA: Diagnosis not present

## 2022-12-11 DIAGNOSIS — Z79899 Other long term (current) drug therapy: Secondary | ICD-10-CM | POA: Diagnosis not present

## 2022-12-11 DIAGNOSIS — N3090 Cystitis, unspecified without hematuria: Secondary | ICD-10-CM | POA: Insufficient documentation

## 2022-12-11 DIAGNOSIS — M6281 Muscle weakness (generalized): Secondary | ICD-10-CM | POA: Diagnosis not present

## 2022-12-11 DIAGNOSIS — N3000 Acute cystitis without hematuria: Secondary | ICD-10-CM

## 2022-12-11 DIAGNOSIS — Z7984 Long term (current) use of oral hypoglycemic drugs: Secondary | ICD-10-CM | POA: Diagnosis not present

## 2022-12-11 DIAGNOSIS — Z7982 Long term (current) use of aspirin: Secondary | ICD-10-CM | POA: Insufficient documentation

## 2022-12-11 DIAGNOSIS — Z1152 Encounter for screening for COVID-19: Secondary | ICD-10-CM | POA: Insufficient documentation

## 2022-12-11 DIAGNOSIS — I10 Essential (primary) hypertension: Secondary | ICD-10-CM | POA: Diagnosis present

## 2022-12-11 DIAGNOSIS — N390 Urinary tract infection, site not specified: Secondary | ICD-10-CM | POA: Diagnosis present

## 2022-12-11 DIAGNOSIS — R531 Weakness: Secondary | ICD-10-CM | POA: Diagnosis not present

## 2022-12-11 DIAGNOSIS — E119 Type 2 diabetes mellitus without complications: Secondary | ICD-10-CM | POA: Diagnosis not present

## 2022-12-11 DIAGNOSIS — R110 Nausea: Secondary | ICD-10-CM | POA: Diagnosis not present

## 2022-12-11 DIAGNOSIS — Z9181 History of falling: Secondary | ICD-10-CM | POA: Diagnosis not present

## 2022-12-11 DIAGNOSIS — R2689 Other abnormalities of gait and mobility: Secondary | ICD-10-CM | POA: Diagnosis not present

## 2022-12-11 DIAGNOSIS — E876 Hypokalemia: Secondary | ICD-10-CM | POA: Insufficient documentation

## 2022-12-11 DIAGNOSIS — R2681 Unsteadiness on feet: Secondary | ICD-10-CM | POA: Diagnosis not present

## 2022-12-11 HISTORY — DX: Unspecified macular degeneration: H35.30

## 2022-12-11 HISTORY — DX: Unspecified osteoarthritis, unspecified site: M19.90

## 2022-12-11 HISTORY — DX: Carpal tunnel syndrome, unspecified upper limb: G56.00

## 2022-12-11 HISTORY — DX: Type 2 diabetes mellitus without complications: E11.9

## 2022-12-11 LAB — BASIC METABOLIC PANEL
Anion gap: 7 (ref 5–15)
BUN: 14 mg/dL (ref 8–23)
CO2: 22 mmol/L (ref 22–32)
Calcium: 8.7 mg/dL — ABNORMAL LOW (ref 8.9–10.3)
Chloride: 97 mmol/L — ABNORMAL LOW (ref 98–111)
Creatinine, Ser: 0.82 mg/dL (ref 0.44–1.00)
GFR, Estimated: >60 mL/min (ref >60)
Glucose, Bld: 270 mg/dL — ABNORMAL HIGH (ref 70–99)
Potassium: 4 mmol/L (ref 3.5–5.1)
Sodium: 126 mmol/L — ABNORMAL LOW (ref 135–145)

## 2022-12-11 LAB — CBC
HCT: 34.1 % — ABNORMAL LOW (ref 36.0–46.0)
Hemoglobin: 11.1 g/dL — ABNORMAL LOW (ref 12.0–15.0)
MCH: 30.6 pg (ref 26.0–34.0)
MCHC: 32.6 g/dL (ref 30.0–36.0)
MCV: 93.9 fL (ref 80.0–100.0)
Platelets: 178 10*3/uL (ref 150–400)
RBC: 3.63 MIL/uL — ABNORMAL LOW (ref 3.87–5.11)
RDW: 14.2 % (ref 11.5–15.5)
WBC: 8.6 10*3/uL (ref 4.0–10.5)
nRBC: 0 % (ref 0.0–0.2)

## 2022-12-11 LAB — URINALYSIS, ROUTINE W REFLEX MICROSCOPIC
Bilirubin Urine: NEGATIVE
Glucose, UA: NEGATIVE mg/dL
Hgb urine dipstick: NEGATIVE
Ketones, ur: NEGATIVE mg/dL
Nitrite: POSITIVE — AB
Protein, ur: NEGATIVE mg/dL
Specific Gravity, Urine: 1.012 (ref 1.005–1.030)
pH: 5 (ref 5.0–8.0)

## 2022-12-11 LAB — RESP PANEL BY RT-PCR (RSV, FLU A&B, COVID)  RVPGX2
Influenza A by PCR: NEGATIVE
Influenza B by PCR: NEGATIVE
Resp Syncytial Virus by PCR: NEGATIVE
SARS Coronavirus 2 by RT PCR: NEGATIVE

## 2022-12-11 LAB — TSH: TSH: 3.634 u[IU]/mL (ref 0.350–4.500)

## 2022-12-11 LAB — CBG MONITORING, ED: Glucose-Capillary: 217 mg/dL — ABNORMAL HIGH (ref 70–99)

## 2022-12-11 MED ORDER — DIPHENHYDRAMINE HCL 50 MG/ML IJ SOLN
12.5000 mg | Freq: Once | INTRAMUSCULAR | Status: DC
  Filled 2022-12-11: qty 1

## 2022-12-11 MED ORDER — ACETAMINOPHEN 650 MG RE SUPP: 650.0000 mg | Freq: Four times a day (QID) | RECTAL | Status: DC | PRN

## 2022-12-11 MED ORDER — SODIUM CHLORIDE 0.9 % IV BOLUS
500.0000 mL | Freq: Once | INTRAVENOUS | Status: AC
  Administered 2022-12-11: 500 mL via INTRAVENOUS

## 2022-12-11 MED ORDER — INSULIN ASPART 100 UNIT/ML IJ SOLN
0.0000 [IU] | Freq: Every day | INTRAMUSCULAR | Status: DC
  Administered 2022-12-14 – 2022-12-15 (×2): 2 [IU] via SUBCUTANEOUS

## 2022-12-11 MED ORDER — POLYETHYLENE GLYCOL 3350 17 G PO PACK
17.0000 g | PACK | Freq: Every day | ORAL | Status: DC | PRN
  Administered 2022-12-14: 17 g via ORAL
  Filled 2022-12-11: qty 1

## 2022-12-11 MED ORDER — ONDANSETRON HCL 4 MG/2ML IJ SOLN: 4.0000 mg | Freq: Four times a day (QID) | INTRAMUSCULAR | Status: DC | PRN

## 2022-12-11 MED ORDER — SODIUM CHLORIDE 0.9 % IV SOLN
1.0000 g | INTRAVENOUS | Status: DC
  Administered 2022-12-11: 1 g via INTRAVENOUS
  Filled 2022-12-11: qty 10

## 2022-12-11 MED ORDER — SODIUM CHLORIDE 0.9 % IV SOLN: INTRAVENOUS | Status: AC

## 2022-12-11 MED ORDER — FOSFOMYCIN TROMETHAMINE 3 G PO PACK
3.0000 g | PACK | Freq: Once | ORAL | Status: AC
  Administered 2022-12-11: 3 g via ORAL
  Filled 2022-12-11: qty 3

## 2022-12-11 MED ORDER — LISINOPRIL 5 MG PO TABS
5.0000 mg | ORAL_TABLET | Freq: Every day | ORAL | Status: DC
  Administered 2022-12-12 – 2022-12-16 (×4): 5 mg via ORAL
  Filled 2022-12-11 (×5): qty 1

## 2022-12-11 MED ORDER — ONDANSETRON HCL 4 MG PO TABS: 4.0000 mg | ORAL_TABLET | Freq: Four times a day (QID) | ORAL | Status: DC | PRN

## 2022-12-11 MED ORDER — INSULIN ASPART 100 UNIT/ML IJ SOLN
0.0000 [IU] | Freq: Three times a day (TID) | INTRAMUSCULAR | Status: DC
  Administered 2022-12-12: 3 [IU] via SUBCUTANEOUS
  Administered 2022-12-12 – 2022-12-13 (×2): 1 [IU] via SUBCUTANEOUS
  Administered 2022-12-13 (×2): 2 [IU] via SUBCUTANEOUS
  Administered 2022-12-14: 3 [IU] via SUBCUTANEOUS
  Administered 2022-12-14 (×2): 5 [IU] via SUBCUTANEOUS
  Administered 2022-12-15: 2 [IU] via SUBCUTANEOUS
  Administered 2022-12-15 (×2): 3 [IU] via SUBCUTANEOUS
  Administered 2022-12-16: 9 [IU] via SUBCUTANEOUS
  Administered 2022-12-16: 3 [IU] via SUBCUTANEOUS

## 2022-12-11 MED ORDER — ACETAMINOPHEN 325 MG PO TABS
650.0000 mg | ORAL_TABLET | Freq: Four times a day (QID) | ORAL | Status: DC | PRN
  Administered 2022-12-12 – 2022-12-15 (×3): 650 mg via ORAL
  Filled 2022-12-11 (×3): qty 2

## 2022-12-11 MED ORDER — SODIUM CHLORIDE 0.9 % IV SOLN
1.0000 g | Freq: Once | INTRAVENOUS | Status: DC
  Filled 2022-12-11: qty 10

## 2022-12-11 MED ORDER — ASPIRIN 81 MG PO TBEC
81.0000 mg | DELAYED_RELEASE_TABLET | Freq: Every day | ORAL | Status: DC
  Administered 2022-12-12 – 2022-12-16 (×5): 81 mg via ORAL
  Filled 2022-12-11 (×5): qty 1

## 2022-12-11 MED ORDER — ENOXAPARIN SODIUM 40 MG/0.4ML IJ SOSY
40.0000 mg | PREFILLED_SYRINGE | INTRAMUSCULAR | Status: DC
  Administered 2022-12-12 – 2022-12-16 (×5): 40 mg via SUBCUTANEOUS
  Filled 2022-12-11 (×6): qty 0.4

## 2022-12-11 NOTE — ED Notes (Signed)
Report given to Brenda, RN

## 2022-12-11 NOTE — ED Notes (Signed)
LM for daughter Manuela Schwartz to advise of pt room number on 2A

## 2022-12-11 NOTE — Assessment & Plan Note (Signed)
UA suggestive of UTI with positive nitrates and leukocytes many bacteria.  Last urine culture 6 months ago greater than 100,000 colonies of pansensitive E. coli. - IV ceftriaxone 1 g daily - F/u Urine cultures

## 2022-12-11 NOTE — ED Provider Triage Note (Signed)
Emergency Medicine Provider Triage Evaluation Note  Wanda Shields , a 88 y.o. female  was evaluated in triage.  Pt complains of generalized weakness, gradually worsening since Christmas.  Endorses nausea this morning.  No vomiting.  No diarrhea.  Family states that she fell Christmas Eve and again on Christmas Day.  Walks with a walker at baseline uses a wheelchair this morning unable to stand or walk without assistance.  States that both legs feel weak.  Family notes skin tears to right lower leg secondary to her recent fall.  She denies any pain of her knee, chest pain, shortness of breath, fever or chills.  Concerned she may have a UTI.  Review of Systems  Positive: Generalized weakness Negative: Abdominal pain, diarrhea, fever or chills  Physical Exam  BP 121/64 (BP Location: Right Arm)   Pulse 80   Temp 98 F (36.7 C) (Oral)   Resp 18   Ht '5\' 8"'$  (1.727 m)   Wt 62.1 kg   SpO2 98%   BMI 20.83 kg/m  Gen:   Awake, no distress   Resp:  Normal effort  MSK:   Moves extremities without difficulty  Other:  Abdomen soft nontender  Medical Decision Making  Medically screening exam initiated at 1:24 PM.  Appropriate orders placed.  Wanda Shields was informed that the remainder of the evaluation will be completed by another provider, this initial triage assessment does not replace that evaluation, and the importance of remaining in the ED until their evaluation is complete.     Kem Parkinson, PA-C 12/11/22 1330

## 2022-12-11 NOTE — Assessment & Plan Note (Signed)
Appears to be more of a chronic issue.  Na- 126, last checked 6 months ago, when patient was at [facility] with similar complaints, sodium was 126 also, and 127 when she was discharged 4 days later.  Per Care Everywhere, baseline appears to be 128- 131.   -Urine sodium, urine osmolality -serum Osmolality -TSH

## 2022-12-11 NOTE — H&P (Signed)
History and Physical    Wanda Shields VQQ:[id number] DOB: 10/15/1930 DOA: 12/11/2022  PCP: Manon Hilding, MD   Patient coming from: Home   I have personally briefly reviewed patient's old medical records in [place]  Chief Complaint: Weakness  HPI: Wanda Shields is a 88 y.o. female with medical history significant for DM, macular degeneration. Patient presented to the Ed with c/o generalized weakness, and falls. At baseline patient ambulates with a walker, but today she had to use  a wheel chair due to weakness. She lives alone but family members check on her ands she has a life alert necklace. Patient has been progressively getting weak since Christmas eve.  She fell that day and on Christmas day.  When she fell 4 days ago she barely bumped her head.  Reports when she falls frequently like this, she usually is dehydrated, has  hyponatremia and UTI.  Hence she was brought to the ED. On my evaluation, patient denies dysuria, no fevers no chills, no cough, no difficulty breathing, vomiting.  No loose stools.  Family reports that patient never has urinary symptoms with a UTIs.  ED Course: Temp 98. Heart rate 50s- 80.  Sodium 126.  UA suggestive of UTI with positive nitrites leukocytes and many bacteria. Chest x-ray negative for acute abnormality. Fosfomycin given in the ED for UTI due to concern for penicillin allergy. Due to weakness, hospitalist to admit.  Review of Systems: As per HPI all other systems reviewed and negative.  Past Medical History:  Diagnosis Date   Arthritis    Carpal tunnel syndrome    Diabetes mellitus without complication ([place])    Macular degeneration     Past Surgical History:  Procedure Laterality Date   ABDOMINAL HYSTERECTOMY     APPENDECTOMY     BACK SURGERY     CHOLECYSTECTOMY     HIP SURGERY     TONSILLECTOMY       reports that she has never smoked. She has never used smokeless tobacco. She reports that she does not drink alcohol and  does not use drugs.  Allergies  Allergen Reactions   Atropine    Demerol [Meperidine Hcl]    Penicillins     Family history of hypertension.  Prior to Admission medications   Medication Sig Start Date End Date Taking? Authorizing Provider  acetaminophen (TYLENOL) 500 MG tablet Take 1,000 mg by mouth every 6 (six) hours as needed for moderate pain.   Yes [provider]  alendronate (FOSAMAX) 70 MG tablet Take 70 mg by mouth once a week. Sunday 06/05/20  Yes [provider]  Aspirin 81 MG CAPS Take 1 capsule every day by oral route.   Yes [provider]  Cholecalciferol (VITAMIN D3) 10 MCG (400 UNIT) tablet  02/11/22  Yes [provider]  diclofenac Sodium (VOLTAREN ARTHRITIS PAIN) 1 % GEL Apply 2 g topically 4 (four) times daily.   Yes [provider]  glipiZIDE (GLUCOTROL PO) Take 5 mg by mouth in the morning.   Yes [provider]  lidocaine (HM LIDOCAINE PATCH) 4 % Place 1 patch onto the skin daily.   Yes [provider]  lisinopril (ZESTRIL) 10 MG tablet Take 5 mg by mouth daily. Take 5 mg daily 07/18/21  Yes [provider]  metFORMIN (GLUCOPHAGE) 500 MG tablet Take 500-1,000 mg by mouth See admin instructions. Take 500 mg Monday,Tuesday,Wednesday,Friday and Saturday and then 1000 mg on Sunday and Thurday.   Yes  [provider]  Multiple Vitamins-Minerals (PRESERVISION AREDS PO) Take 1 tablet by mouth 2 (two) times daily.   Yes [provider]  nabumetone (RELAFEN) 500 MG tablet Take 500 mg by mouth 2 (two) times daily.   Yes [provider]  ondansetron (ZOFRAN) 8 MG tablet Take 8 mg by mouth 3 (three) times daily as needed. 07/19/22  Yes [provider]  vitamin B-12 (CYANOCOBALAMIN) 500 MCG tablet Take 500 mcg by mouth daily.   Yes [provider]  meloxicam (MOBIC) 15 MG tablet Take 15 mg by mouth daily. Patient not taking: Reported on 12/11/2022 10/07/22   [provider]    Physical Exam: Vitals:   12/11/22 1600 12/11/22 1654 12/11/22 1700 12/11/22 1730  BP: (!) 138/93  (!) 160/83 136/71  Pulse: (!) 52  (!) 58 61  Resp: 18  20 (!) 21  Temp:  97.8 F (36.6 C)    TempSrc:  Oral    SpO2: 99%  99% 99%  Weight:      Height:        Constitutional: NAD, calm, comfortable Vitals:   12/11/22 1600 12/11/22 1654 12/11/22 1700 12/11/22 1730  BP: (!) 138/93  (!) 160/83 136/71  Pulse: (!) 52  (!) 58 61  Resp: 18  20 (!) 21  Temp:  97.8 F (36.6 C)    TempSrc:  Oral    SpO2: 99%  99% 99%  Weight:      Height:       Eyes: PERRL, lids and conjunctivae normal ENMT: Mucous membranes are moist. Neck: normal, supple, no masses, no thyromegaly Respiratory: clear to auscultation bilaterally, no wheezing, no crackles. Normal respiratory effort. No accessory muscle use.  Cardiovascular: Regular rate and rhythm, no murmurs / rubs / gallops. No extremity edema. 2+ pedal pulses.   Abdomen: no tenderness, no masses palpated. No hepatosplenomegaly. .  Musculoskeletal: no clubbing / cyanosis. No joint deformity upper and lower extremities.  Skin: Superficial skin tear to upper 1/3 of right leg with some purulence,  Neurologic: +4/5 strength in all extremities, speech clear without evidence of aphasia, no facial asymmetry. Psychiatric: Normal judgment and insight. Alert and oriented x 3. Normal mood.   Labs on Admission: I have personally reviewed following labs and imaging studies  CBC: Recent Labs  Lab 12/11/22 1237  WBC 8.6  HGB 11.1*  HCT 34.1*  MCV 93.9  PLT 147   Basic Metabolic Panel: Recent Labs  Lab 12/11/22 1237  NA 126*  K 4.0  CL 97*  CO2 22  GLUCOSE 270*  BUN 14  CREATININE 0.82  CALCIUM 8.7*   CBG: Recent Labs  Lab 12/11/22 1353  GLUCAP 217*   Urine analysis:    Component Value Date/Time   COLORURINE YELLOW 12/11/2022 1330   APPEARANCEUR HAZY (A) 12/11/2022 1330   LABSPEC 1.012 12/11/2022 1330   PHURINE 5.0  12/11/2022 1330   GLUCOSEU NEGATIVE 12/11/2022 1330   HGBUR NEGATIVE 12/11/2022 1330   [place] 12/11/2022 1330   KETONESUR NEGATIVE 12/11/2022 1330   PROTEINUR NEGATIVE 12/11/2022 1330   NITRITE POSITIVE (A) 12/11/2022 1330   LEUKOCYTESUR SMALL (A) 12/11/2022 1330    Radiological Exams on Admission: DG Chest Portable 1 View  Result Date: 12/11/2022 CLINICAL DATA:  Generalized weakness, nausea, fall EXAM: PORTABLE CHEST 1 VIEW COMPARISON:  06/02/2022 FINDINGS: Cardiac size is within normal limits. Lung fields are clear of any pulmonary edema or new focal infiltrates. There is no pleural effusion or pneumothorax. Old  healed left rib fractures have not changed significantly. IMPRESSION: No active disease. Electronically Signed   By: Elmer Picker M.D.   On: 12/11/2022 13:52    EKG: Independently reviewed.  Sinus rhythm, rate 82, QTc 425.  No prior EKG to compare.  Assessment/Plan Principal Problem:   Generalized weakness Active Problems:   Hyponatremia   UTI (urinary tract infection)   DM (diabetes mellitus) (HCC)   HTN (hypertension)    Assessment and Plan: * Generalized weakness Generalized weakness, falls.  Likely secondary to advanced age and increasing debility versus possible UTI.  Ambulates with a walker, had to use wheelchair today.  At baseline she is prone to falls. -PT evaluation - 1 L bolus given, N/s 75cc/hr x 20 hrs  UTI (urinary tract infection) UA suggestive of UTI with positive nitrates and leukocytes many bacteria.  Last urine culture 6 months ago greater than 100,000 colonies of pansensitive E. coli. - IV ceftriaxone 1 g daily - F/u Urine cultures  Hyponatremia Appears to be more of a chronic issue.  Na- 126, last checked 6 months ago, when patient was at [facility] with similar complaints, sodium was 126 also, and 127 when she was discharged 4 days later.  Per Care Everywhere, baseline appears to be 128- 131.   -Urine sodium, urine  osmolality -serum Osmolality -TSH    HTN (hypertension) Resume lisinopril  DM (diabetes mellitus) ([place]) - HgbA1c -Hold metformin - SSI- S   DVT prophylaxis: Lovenox Code Status: Full code, confirmed with family at bedside. Family Communication: Daughter in Actor power of attorney, and daughter Jeani Hawking is at bedside. Disposition Plan: ~ 2 days Consults called: None Admission status:  Obs Med surg     Author: Bethena Roys, MD 12/11/2022 8:30 PM  For on call review [email or web address].

## 2022-12-11 NOTE — ED Notes (Signed)
Family says pt recently had tooth pulled.

## 2022-12-11 NOTE — ED Triage Notes (Signed)
Family reports worsening generalized weakness over the past few days.  Reports nausea this morning.  Family says pt fell twice recently.  Reports walks with a walker.  Has had to use wheelchair today due to weakness. Reports legs ache and knees feel weak.

## 2022-12-11 NOTE — Assessment & Plan Note (Signed)
Generalized weakness, falls.  Likely secondary to advanced age and increasing debility versus possible UTI.  Ambulates with a walker, had to use wheelchair today.  At baseline she is prone to falls. -PT evaluation - 1 L bolus given, N/s 75cc/hr x 20 hrs

## 2022-12-11 NOTE — Assessment & Plan Note (Signed)
Resume lisinopril

## 2022-12-11 NOTE — Assessment & Plan Note (Signed)
-   HgbA1c -Hold metformin - SSI- S

## 2022-12-11 NOTE — ED Provider Notes (Signed)
[facility] EMERGENCY DEPARTMENT Provider Note   CSN: [id number] Arrival date & time: 12/11/22  1147     History  Chief Complaint  Patient presents with   Weakness    Wanda Shields is a 88 y.o. female with history of diabetes, arthritis, macular degeneration, thoracic aortic aneursym, T2DM, HTN, and HLD who presents to the emergency department with complaint of generalized weakness for 4 days or so, gradually worsening. Started feeling nausea this morning, no vomiting or diarrhea. Family at bedside states she fell 4 days ago and again the day after. No head trauma or LOC. Not on blood thinners. Uses a walker at baseline, but has been needing a wheelchair starting this morning. Has skin tear on right lower leg, bandaged by family. Family says that when patient is having these symptoms of weakness she usually is dehydrated, has low sodium levels, or has a UTI.    Weakness Associated symptoms: no abdominal pain, no chest pain, no dysuria, no headaches, no nausea and no vomiting        Home Medications Prior to Admission medications   Medication Sig Start Date End Date Taking? Authorizing Provider  acetaminophen (TYLENOL) 500 MG tablet Take 1,000 mg by mouth every 6 (six) hours as needed for moderate pain.   Yes [provider]  alendronate (FOSAMAX) 70 MG tablet Take 70 mg by mouth once a week. Sunday 06/05/20  Yes [provider]  Aspirin 81 MG CAPS Take 1 capsule every day by oral route.   Yes [provider]  Cholecalciferol (VITAMIN D3) 10 MCG (400 UNIT) tablet  02/11/22  Yes [provider]  diclofenac Sodium (VOLTAREN ARTHRITIS PAIN) 1 % GEL Apply 2 g topically 4 (four) times daily.   Yes [provider]  glipiZIDE (GLUCOTROL PO) Take 5 mg by mouth in the morning.   Yes [provider]  lidocaine (HM LIDOCAINE PATCH) 4 % Place 1 patch onto the skin daily.   Yes [provider]  lisinopril (ZESTRIL) 10 MG tablet Take 5  mg by mouth daily. Take 5 mg daily 07/18/21  Yes [provider]  metFORMIN (GLUCOPHAGE) 500 MG tablet Take 500-1,000 mg by mouth See admin instructions. Take 500 mg Monday,Tuesday,Wednesday,Friday and Saturday and then 1000 mg on Sunday and Thurday.   Yes [provider]  Multiple Vitamins-Minerals (PRESERVISION AREDS PO) Take 1 tablet by mouth 2 (two) times daily.   Yes [provider]  nabumetone (RELAFEN) 500 MG tablet Take 500 mg by mouth 2 (two) times daily.   Yes [provider]  ondansetron (ZOFRAN) 8 MG tablet Take 8 mg by mouth 3 (three) times daily as needed. 07/19/22  Yes [provider]  vitamin B-12 (CYANOCOBALAMIN) 500 MCG tablet Take 500 mcg by mouth daily.   Yes [provider]  meloxicam (MOBIC) 15 MG tablet Take 15 mg by mouth daily. Patient not taking: Reported on 12/11/2022 10/07/22   [provider]      Allergies    Atropine, Demerol [meperidine hcl], and Penicillins    Review of Systems   Review of Systems  Constitutional:  Positive for fatigue.  Cardiovascular:  Negative for chest pain.  Gastrointestinal:  Negative for abdominal pain, nausea and vomiting.  Genitourinary:  Negative for dysuria.  Skin:  Positive for wound.  Neurological:  Positive for weakness. Negative for syncope and headaches.  All other systems reviewed and are negative.   Physical Exam Updated Vital Signs BP 136/71   Pulse 61  Temp 97.8 F (36.6 C) (Oral)   Resp (!) 21   Ht '5\' 8"'$  (1.727 m)   Wt 62.1 kg   SpO2 99%   BMI 20.83 kg/m  Physical Exam Vitals and nursing note reviewed.  Constitutional:      Appearance: Normal appearance.  HENT:     Head: Normocephalic and atraumatic.  Eyes:     Conjunctiva/sclera: Conjunctivae normal.  Cardiovascular:     Rate and Rhythm: Normal rate and regular rhythm.  Pulmonary:     Effort: Pulmonary effort is normal. No respiratory distress.     Breath sounds: Normal breath sounds.   Abdominal:     General: There is no distension.     Palpations: Abdomen is soft.     Tenderness: There is no abdominal tenderness.  Musculoskeletal:     Comments: Skin tear approximately 7 cm in length on right lower shin, bandaged by family. Does not appear infected.  Some muscular tenderness over right lateral thigh, no bony deformities.   Skin:    General: Skin is warm and dry.  Neurological:     General: No focal deficit present.     Mental Status: She is alert and oriented to person, place, and time.     ED Results / Procedures / Treatments   Labs (all labs ordered are listed, but only abnormal results are displayed) Labs Reviewed  BASIC METABOLIC PANEL - Abnormal; Notable for the following components:      Result Value   Sodium 126 (*)    Chloride 97 (*)    Glucose, Bld 270 (*)    Calcium 8.7 (*)    All other components within normal limits  CBC - Abnormal; Notable for the following components:   RBC 3.63 (*)    Hemoglobin 11.1 (*)    HCT 34.1 (*)    All other components within normal limits  URINALYSIS, ROUTINE W REFLEX MICROSCOPIC - Abnormal; Notable for the following components:   APPearance HAZY (*)    Nitrite POSITIVE (*)    Leukocytes,Ua SMALL (*)    Bacteria, UA MANY (*)    All other components within normal limits  CBG MONITORING, ED - Abnormal; Notable for the following components:   Glucose-Capillary 217 (*)    All other components within normal limits  RESP PANEL BY RT-PCR (RSV, FLU A&B, COVID)  RVPGX2  HEMOGLOBIN A1C    EKG EKG Interpretation  Date/Time:  Thursday December 11 2022 12:08:05 EST Ventricular Rate:  82 PR Interval:  174 QRS Duration: 82 QT Interval:  364 QTC Calculation: 425 R Axis:   0 Text Interpretation: Sinus rhythm with marked sinus arrhythmia Low voltage QRS Possible Inferior infarct , age undetermined Cannot rule out Anterior infarct , age undetermined Abnormal ECG No previous ECGs available Confirmed by Sherwood Gambler  [phone]) on 12/11/2022 12:33:58 PM  Radiology DG Chest Portable 1 View  Result Date: 12/11/2022 CLINICAL DATA:  Generalized weakness, nausea, fall EXAM: PORTABLE CHEST 1 VIEW COMPARISON:  06/02/2022 FINDINGS: Cardiac size is within normal limits. Lung fields are clear of any pulmonary edema or new focal infiltrates. There is no pleural effusion or pneumothorax. Old healed left rib fractures have not changed significantly. IMPRESSION: No active disease. Electronically Signed   By: Elmer Picker M.D.   On: 12/11/2022 13:52    Procedures Procedures    Medications Ordered in ED Medications  sodium chloride 0.9 % bolus 500 mL (0 mLs Intravenous Stopped 12/11/22 1746)  fosfomycin (MONUROL) packet 3 g (3  g Oral Given 12/11/22 1637)  sodium chloride 0.9 % bolus 500 mL (500 mLs Intravenous New Bag/Given 12/11/22 1746)    ED Course/ Medical Decision Making/ A&P                           Medical Decision Making Amount and/or Complexity of Data Reviewed Labs: ordered.  Risk Prescription drug management. Decision regarding hospitalization.   This patient is a 88 y.o. female  who presents to the ED for concern of generalized weakness for several days.   Differential diagnoses prior to evaluation: The emergent differential diagnosis includes, but is not limited to,  CVA, spinal cord injury, ACS, arrhythmia, syncope, orthostatic hypotension, sepsis, hypoglycemia, electrolyte disturbance, hypothyroidism, respiratory failure, anemia, dehydration, heat injury, polypharmacy, malignancy. This is not an exhaustive differential.   Past Medical History / Co-morbidities: diabetes, arthritis, macular degeneration, thoracic aortic aneursym, T2DM, HTN, and HLD  Additional history: Chart reviewed. Pertinent results include: medical history found through [place] as patient gets most of her care at outside facilities  Physical Exam: Physical exam performed. The pertinent findings include:  Normal vital signs, afebrile. Alert and oriented. Abdomen soft, nontender. Skin tear on right lower leg, does not appear infected.   Lab Tests/Imaging studies: I personally interpreted labs/imaging and the pertinent results include:  hemoglobin stable. Na 126, Cl 91, glucose 270, normal kidney function. Urinalysis with positive nitrites, small leukocytes, WBC 21-50. Respiratory panel negative.   Cardiac monitoring: EKG obtained and interpreted by my attending physician which shows: sinus arrhythmia   Medications: I ordered medication including IV fluids and antibiotics.  Patient with history of penicillin allergy, no documented history of receiving cephalosporins. Gave fosfomycin instead. I have reviewed the patients home medicines and have made adjustments as needed.  Consultations obtained: I consulted with hospitalist Dr Denton Brick who will admit to their service.    Disposition: After consideration of the diagnostic results and the patients response to treatment, I feel that patient would benefit from medical admission for weakness in the setting of UTI. Not septic.   Final Clinical Impression(s) / ED Diagnoses Final diagnoses:  Weakness  Acute cystitis without hematuria    Rx / DC Orders ED Discharge Orders     None      Portions of this report may have been transcribed using voice recognition software. Every effort was made to ensure accuracy; however, inadvertent computerized transcription errors may be present.    Estill Cotta 12/11/22 1853    Wyvonnia Dusky, MD 12/11/22 [phone]

## 2022-12-12 DIAGNOSIS — R531 Weakness: Secondary | ICD-10-CM | POA: Diagnosis not present

## 2022-12-12 DIAGNOSIS — I10 Essential (primary) hypertension: Secondary | ICD-10-CM | POA: Diagnosis not present

## 2022-12-12 DIAGNOSIS — N3000 Acute cystitis without hematuria: Secondary | ICD-10-CM | POA: Diagnosis not present

## 2022-12-12 DIAGNOSIS — E871 Hypo-osmolality and hyponatremia: Secondary | ICD-10-CM | POA: Diagnosis not present

## 2022-12-12 LAB — BASIC METABOLIC PANEL
Anion gap: 5 (ref 5–15)
BUN: 10 mg/dL (ref 8–23)
CO2: 24 mmol/L (ref 22–32)
Calcium: 8.4 mg/dL — ABNORMAL LOW (ref 8.9–10.3)
Chloride: 107 mmol/L (ref 98–111)
Creatinine, Ser: 0.72 mg/dL (ref 0.44–1.00)
GFR, Estimated: >60 mL/min (ref >60)
Glucose, Bld: 139 mg/dL — ABNORMAL HIGH (ref 70–99)
Potassium: 3.8 mmol/L (ref 3.5–5.1)
Sodium: 136 mmol/L (ref 135–145)

## 2022-12-12 LAB — CBC
HCT: 35.2 % — ABNORMAL LOW (ref 36.0–46.0)
Hemoglobin: 11.3 g/dL — ABNORMAL LOW (ref 12.0–15.0)
MCH: 30.6 pg (ref 26.0–34.0)
MCHC: 32.1 g/dL (ref 30.0–36.0)
MCV: 95.4 fL (ref 80.0–100.0)
Platelets: 151 10*3/uL (ref 150–400)
RBC: 3.69 MIL/uL — ABNORMAL LOW (ref 3.87–5.11)
RDW: 14.4 % (ref 11.5–15.5)
WBC: 8.2 10*3/uL (ref 4.0–10.5)
nRBC: 0 % (ref 0.0–0.2)

## 2022-12-12 LAB — OSMOLALITY: Osmolality: 283 mOsm/kg (ref 275–295)

## 2022-12-12 LAB — SODIUM, URINE, RANDOM: Sodium, Ur: 97 mmol/L

## 2022-12-12 LAB — GLUCOSE, CAPILLARY
Glucose-Capillary: 149 mg/dL — ABNORMAL HIGH (ref 70–99)
Glucose-Capillary: 145 mg/dL — ABNORMAL HIGH (ref 70–99)
Glucose-Capillary: 225 mg/dL — ABNORMAL HIGH (ref 70–99)
Glucose-Capillary: 113 mg/dL — ABNORMAL HIGH (ref 70–99)
Glucose-Capillary: 165 mg/dL — ABNORMAL HIGH (ref 70–99)

## 2022-12-12 MED ORDER — SALINE SPRAY 0.65 % NA SOLN: 1.0000 | NASAL | Status: DC | PRN

## 2022-12-12 NOTE — Plan of Care (Signed)
  Problem: Acute Rehab PT Goals(only PT should resolve) Goal: Patient Will Transfer Sit To/From Stand Outcome: Progressing Flowsheets (Taken 12/12/2022 1420) Patient will transfer sit to/from stand:  with supervision  with min guard assist Goal: Pt Will Transfer Bed To Chair/Chair To Bed Outcome: Progressing Flowsheets (Taken 12/12/2022 1420) Pt will Transfer Bed to Chair/Chair to Bed:  with supervision  min guard assist Goal: Pt Will Ambulate Outcome: Progressing Flowsheets (Taken 12/12/2022 1420) Pt will Ambulate:  25 feet  with moderate assist  with minimal assist  with rolling walker Goal: Pt/caregiver will Perform Home Exercise Program Outcome: Progressing Flowsheets (Taken 12/12/2022 1420) Pt/caregiver will Perform Home Exercise Program:  For increased strengthening  For improved balance  Independently  2:21 PM, 12/12/22 Mearl Latin PT, DPT Physical Therapist at [facility]

## 2022-12-12 NOTE — TOC Initial Note (Signed)
Transition of Care [facility] - Initial/Assessment Note    Patient Details  Name: Wanda Shields MRN: [id number] Date of Birth: August 14, 1930  [facility] CM/SW Contact:    Boneta Lucks, RN Phone Number: 12/12/2022, 4:08 PM  Clinical Narrative:   Patient admitted in OBS with generalized weakness. CM at the bedside. Patient lives alone. PT is recommending SNF. Patient is agreeable, states she is weak. She has been to [facility] before.  She ask that TOC check with [facility] and [facility] for beds.  FL2 completed and sent out. INS AUTH started.                 Expected Discharge Plan: Skilled Nursing Facility Barriers to Discharge: Continued Medical Work up   Patient Goals and CMS Choice Patient states their goals for this hospitalization and ongoing recovery are:: agreeable to SNF CMS Medicare.gov Compare Post Acute Care list provided to:: Patient Choice offered to / list presented to : Patient      Expected Discharge Plan and Services       Living arrangements for the past 2 months: Single Family Home                   Prior Living Arrangements/Services Living arrangements for the past 2 months: Single Family Home Lives with:: Self Patient language and need for interpreter reviewed:: Yes        Need for Family Participation in Patient Care: Yes (Comment) Care giver support system in place?: Yes (comment)   Criminal Activity/Legal Involvement Pertinent to Current Situation/Hospitalization: No - Comment as needed  Activities of Daily Living Home Assistive Devices/Equipment: Cane (specify quad or straight), Walker (specify type) ADL Screening (condition at time of admission) Patient's cognitive ability adequate to safely complete daily activities?: Yes Is the patient deaf or have difficulty hearing?: No Does the patient have difficulty seeing, even when wearing glasses/contacts?: Yes Does the patient have difficulty concentrating, remembering, or making decisions?: No Patient  able to express need for assistance with ADLs?: Yes Does the patient have difficulty dressing or bathing?: Yes Independently performs ADLs?: Yes (appropriate for developmental age) Does the patient have difficulty walking or climbing stairs?: Yes Weakness of Legs: Both Weakness of Arms/Hands: None  Permission Sought/Granted                  Emotional Assessment     Affect (typically observed): Accepting Orientation: : Oriented to Self, Oriented to Place, Oriented to  Time, Oriented to Situation Alcohol / Substance Use: Not Applicable Psych Involvement: No (comment)  Admission diagnosis:  Hyponatremia [E87.1] Weakness [R53.1] Acute cystitis without hematuria [N30.00] Patient Active Problem List   Diagnosis Date Noted   Hyponatremia 12/11/2022   Generalized weakness 12/11/2022   UTI (urinary tract infection) 12/11/2022   DM (diabetes mellitus) ([place]) 12/11/2022   HTN (hypertension) 12/11/2022   PCP:  Manon Hilding, MD Pharmacy:   [place], [address] [place] [address] Phone: [phone] Fax: [place], [address] [place] [address] Phone: [phone] Fax: [phone]     Social Determinants of Health (SDOH) Social History: [place]: Food Insecurity Present (12/12/2022)  Housing: Low Risk  (12/12/2022)  Transportation Needs: No Transportation Needs (12/12/2022)  Utilities: Not At Risk (12/12/2022)  Tobacco Use: Low Risk  (12/11/2022)   SDOH Interventions:

## 2022-12-12 NOTE — NC FL2 (Signed)
[place] LEVEL OF CARE FORM     IDENTIFICATION  Patient Name: Wanda Shields Birthdate: [birth year] Sex: female Admission Date (Current Location): 12/11/2022  [facility] and [place] Number:  [employer] and Address:  [place] [address] [place]      Provider Number: [id number]  Attending Physician Name and Address:  Flora Lipps, MD  Relative Name and Phone Number:  Bill,jb (Son) [phone]    Current Level of Care: Hospital Recommended Level of Care: [place] Prior Approval Number:    Date Approved/Denied:   PASRR Number: [id number]  Discharge Plan: SNF    Current Diagnoses: Patient Active Problem List   Diagnosis Date Noted   Hyponatremia 12/11/2022   Generalized weakness 12/11/2022   UTI (urinary tract infection) 12/11/2022   DM (diabetes mellitus) ([place]) 12/11/2022   HTN (hypertension) 12/11/2022    Orientation RESPIRATION BLADDER Height & Weight     Self, Time, Situation, Place  Normal Continent Weight: 60.2 kg Height:  '5\' 8"'$  (172.7 cm)  BEHAVIORAL SYMPTOMS/MOOD NEUROLOGICAL BOWEL NUTRITION STATUS      Continent    AMBULATORY STATUS COMMUNICATION OF NEEDS Skin   Extensive Assist Verbally Normal                       Personal Care Assistance Level of Assistance  Bathing, Feeding, Dressing Bathing Assistance: Maximum assistance Feeding assistance: Independent Dressing Assistance: Limited assistance     Functional Limitations Info  Sight, Hearing, Speech Sight Info: Adequate Hearing Info: Adequate Speech Info: Adequate    SPECIAL CARE FACTORS FREQUENCY  PT (By licensed PT)                    Contractures Contractures Info: Not present    Additional Factors Info  Code Status, Allergies Code Status Info: Full Allergies Info: Atropine, Demerol, Penicillins           Current Medications (12/12/2022):  This is the current hospital active medication  list Current Facility-Administered Medications  Medication Dose Route Frequency Provider Last Rate Last Admin   0.9 %  sodium chloride infusion   Intravenous Continuous Emokpae, Ejiroghene E, MD 75 mL/hr at 12/11/22 2255 New Bag at 12/11/22 2255   acetaminophen (TYLENOL) tablet 650 mg  650 mg Oral Q6H PRN Emokpae, Ejiroghene E, MD   650 mg at 12/12/22 0807   Or   acetaminophen (TYLENOL) suppository 650 mg  650 mg Rectal Q6H PRN Emokpae, Ejiroghene E, MD       aspirin EC tablet 81 mg  81 mg Oral Daily Emokpae, Ejiroghene E, MD   81 mg at 12/12/22 1026   enoxaparin (LOVENOX) injection 40 mg  40 mg Subcutaneous Q24H Emokpae, Ejiroghene E, MD   40 mg at 12/12/22 1026   insulin aspart (novoLOG) injection 0-5 Units  0-5 Units Subcutaneous QHS Emokpae, Ejiroghene E, MD       insulin aspart (novoLOG) injection 0-9 Units  0-9 Units Subcutaneous TID WC Emokpae, Ejiroghene E, MD   3 Units at 12/12/22 1204   lisinopril (ZESTRIL) tablet 5 mg  5 mg Oral Daily Emokpae, Ejiroghene E, MD   5 mg at 12/12/22 1026   ondansetron (ZOFRAN) tablet 4 mg  4 mg Oral Q6H PRN Emokpae, Ejiroghene E, MD       Or   ondansetron (ZOFRAN) injection 4 mg  4 mg Intravenous Q6H PRN Emokpae, Leanne Chang, MD  polyethylene glycol (MIRALAX / GLYCOLAX) packet 17 g  17 g Oral Daily PRN Emokpae, Ejiroghene E, MD         Discharge Medications: Please see discharge summary for a list of discharge medications.  Relevant Imaging Results:  Relevant Lab Results:   Additional Information SS# [id number]  Boneta Lucks, RN

## 2022-12-12 NOTE — Progress Notes (Addendum)
PROGRESS NOTE    Wanda Shields  HYQ:[id number] DOB: December 25, 1929 DOA: 12/11/2022 PCP: Manon Hilding, MD    Brief Narrative:  Wanda Shields is a 88 y.o. female with medical history significant for diabetes mellitus type 2, macular degeneration presented hospital with generalized weakness with falls.  Patient normally ambulates with the help of walker but had to use a wheelchair due to increasing weakness.  She has had a fall on Christmas Day since then she has had increasing weakness and had also hit her head.  In the ED vitals were stable.  Labs showed hyponatremia with sodium of 126.  Urinary analysis showed some UTI with positive nitrates leukocytes chest x-ray was negative for any acute findings.  Fosfomycin was given in the ED due to UTI concerning for penicillin allergy.  Patient was then considered for admission to hospital due to worsening debility and weakness.    Assessment and plan.  Generalized weakness/ falls.   Likely secondary to increasing debility advanced age possible UTI.  At baseline uses walker.  Patient is prone to falls.  Will get PT OT evaluation.  IV fluid was given in the ED for possible volume depletion.  COVID influenza and RSV negative.   UTI (urinary tract infection) Previous history of pansensitive E. coli.  Received 1 dose of fosfomycin in the ED.  We will hold off with Rocephin. Hyponatremia Appears chronic hyponatremia.  Around baseline at this time.  Pending urinary sodium, urinary osmolality, TSH, serum osmolality.    HTN (hypertension) Continue lisinopril, continue to monitor.  DM (diabetes mellitus) ([place]) Continue sliding scale insulin, 86, diabetic diet.  Hold metformin.  Check hemoglobin A1c.      DVT prophylaxis: enoxaparin (LOVENOX) injection 40 mg Start: 12/12/22 1000   Code Status:     Code Status: Full Code  Disposition: Uncertain.  Will get PT evaluation.  Status is: Observation  The patient will require care spanning > 2 midnights  and should be moved to inpatient because: Generalized weakness, ambulatory dysfunction, possible need for rehabilitation   Family Communication: None at bedside  Consultants:  None  Procedures:  None  Antimicrobials:  Fosfomycin x 1 in the ED.  Anti-infectives (From admission, onward)    Start     Dose/Rate Route Frequency Ordered Stop   12/11/22 1945  cefTRIAXone (ROCEPHIN) 1 g in sodium chloride 0.9 % 100 mL IVPB        1 g 200 mL/hr over 30 Minutes Intravenous Every 24 hours 12/11/22 1930     12/11/22 1615  fosfomycin (MONUROL) packet 3 g        3 g Oral  Once 12/11/22 1609 12/11/22 1637   12/11/22 1515  cefTRIAXone (ROCEPHIN) 1 g in sodium chloride 0.9 % 100 mL IVPB  Status:  Discontinued        1 g 200 mL/hr over 30 Minutes Intravenous  Once 12/11/22 1500 12/11/22 1609        Subjective: Today, patient was seen and examined at bedside.  Patient complains of generalized weakness.  Denies any fever, chills nausea, vomiting or abdominal pain.  Objective: Vitals:   12/11/22 2300 12/12/22 0116 12/12/22 0549 12/12/22 0727  BP:  128/66 138/61 (!) 127/59  Pulse:  62 (!) 59 69  Resp:  '18 18 18  '$ Temp:  97.8 F (36.6 C) 98 F (36.7 C) (!) 97.5 F (36.4 C)  TempSrc:  Oral Oral Oral  SpO2:  98% 100% 98%  Weight: 60.2 kg  Height:        Intake/Output Summary (Last 24 hours) at 12/12/2022 1124 Last data filed at 12/12/2022 0550 Gross per 24 hour  Intake 1717.57 ml  Output 200 ml  Net 1517.57 ml   Filed Weights   12/11/22 1208 12/11/22 2300  Weight: 62.1 kg 60.2 kg    Physical Examination: Body mass index is 20.18 kg/m.  General:  Average built, not in obvious distress, elderly female HENT:   No scleral pallor or icterus noted. Oral mucosa is moist.  Chest:  Clear breath sounds.  Diminished breath sounds bilaterally. No crackles or wheezes.  CVS: S1 &S2 heard. No murmur.  Regular rate and rhythm. Abdomen: Soft, nontender, nondistended.  Bowel sounds are  heard.   Extremities: No cyanosis, clubbing or edema.  Peripheral pulses are palpable. Psych: Alert, awake and oriented, normal mood CNS:  No cranial nerve deficits.  Generalized weakness noted. Skin: Warm and dry.  No rashes noted.  Data Reviewed:   CBC: Recent Labs  Lab 12/11/22 1237  WBC 8.6  HGB 11.1*  HCT 34.1*  MCV 93.9  PLT 675    Basic Metabolic Panel: Recent Labs  Lab 12/11/22 1237 12/12/22 0316  NA 126* 136  K 4.0 3.8  CL 97* 107  CO2 22 24  GLUCOSE 270* 139*  BUN 14 10  CREATININE 0.82 0.72  CALCIUM 8.7* 8.4*    Liver Function Tests: No results for input(s): "AST", "ALT", "ALKPHOS", "BILITOT", "PROT", "ALBUMIN" in the last 168 hours.   Radiology Studies: DG Chest Portable 1 View  Result Date: 12/11/2022 CLINICAL DATA:  Generalized weakness, nausea, fall EXAM: PORTABLE CHEST 1 VIEW COMPARISON:  06/02/2022 FINDINGS: Cardiac size is within normal limits. Lung fields are clear of any pulmonary edema or new focal infiltrates. There is no pleural effusion or pneumothorax. Old healed left rib fractures have not changed significantly. IMPRESSION: No active disease. Electronically Signed   By: Elmer Picker M.D.   On: 12/11/2022 13:52      LOS: 0 days    Flora Lipps, MD [facility] Available via Epic secure chat 7am-7pm After these hours, please refer to coverage provider listed on [email or web address] 12/12/2022, 11:24 AM

## 2022-12-12 NOTE — Evaluation (Signed)
Physical Therapy Evaluation Patient Details Name: Wanda Shields MRN: [id number] DOB: February 10, 1930 Today's Date: 12/12/2022  History of Present Illness  Wanda Shields is a 88 y.o. female with medical history significant for DM, macular degeneration. Patient presented to the Ed with c/o generalized weakness, and falls. At baseline patient ambulates with a walker, but today she had to use  a wheel chair due to weakness. She lives alone but family members check on her ands she has a life alert necklace.  Patient has been progressively getting weak since Christmas eve.  She fell that day and on Christmas day.  When she fell 4 days ago she barely bumped her head.  Reports when she falls frequently like this, she usually is dehydrated, has  hyponatremia and UTI.  Hence she was brought to the ED.  On my evaluation, patient denies dysuria, no fevers no chills, no cough, no difficulty breathing, vomiting.  No loose stools.  Family reports that patient never has urinary symptoms with a UTIs.     Clinical Impression  Patient limited for functional mobility as stated below secondary to BLE weakness, fatigue and poor standing balance. She does not require assist for bed mobility. She demonstrates fair sitting balance EOB with intermittent posterior lean requiring UE support. Patient able to transfer from STS x3 with cueing for hand placement and assist to power up to standing. She demonstrates poor standing balance with posterior lean and lifting RW off ground despite cueing for proper use requiring assist for standing. Patient returned to supine at end of session without assist. Patient will benefit from continued physical therapy in hospital and recommended venue below to increase strength, balance, endurance for safe ADLs and gait.       Recommendations for follow up therapy are one component of a multi-disciplinary discharge planning process, led by the attending physician.  Recommendations may be updated based  on patient status, additional functional criteria and insurance authorization.  Follow Up Recommendations Skilled nursing-short term rehab (<3 hours/day)      Assistance Recommended at Discharge Intermittent Supervision/Assistance  Patient can return home with the following  A little help with walking and/or transfers;A little help with bathing/dressing/bathroom;Assistance with cooking/housework;Assist for transportation    Equipment Recommendations None recommended by PT  Recommendations for Other Services       Functional Status Assessment Patient has had a recent decline in their functional status and demonstrates the ability to make significant improvements in function in a reasonable and predictable amount of time.     Precautions / Restrictions Precautions Precautions: Fall Restrictions Weight Bearing Restrictions: No      Mobility  Bed Mobility Overal bed mobility: Modified Independent             General bed mobility comments: slighly increased time with HOB elevated some    Transfers Overall transfer level: Needs assistance Equipment used: Rolling walker (2 wheels) Transfers: Sit to/from Stand Sit to Stand: Min assist           General transfer comment: STS x3, cueing for handplacement, assist to power up to standing    Ambulation/Gait                  Stairs            Wheelchair Mobility    Modified Rankin (Stroke Patients Only)       Balance Overall balance assessment: Needs assistance Sitting-balance support: Feet supported Sitting balance-Leahy Scale: Fair Sitting balance - Comments: seated EOB Postural  control: Posterior lean (standing) Standing balance support: Bilateral upper extremity supported, Reliant on assistive device for balance Standing balance-Leahy Scale: Poor Standing balance comment: posterior lean and lifting RW, requires assist to stand                             Pertinent Vitals/Pain  Pain Assessment Pain Assessment: No/denies pain    Home Living Family/patient expects to be discharged to:: Private residence   Available Help at Discharge: Family;Personal care attendant Type of Home: House Home Access: Ramped entrance       Home Layout: One level Home Equipment: Conservation officer, nature (2 wheels);Shower seat;Grab bars - toilet;Grab bars - tub/shower;Cane - single point;Wheelchair - manual;BSC/3in1      Prior Function Prior Level of Function : Needs assist             Mobility Comments: household ambulation with RW ADLs Comments: assisted by family and aid     Hand Dominance        Extremity/Trunk Assessment   Upper Extremity Assessment Upper Extremity Assessment: Generalized weakness    Lower Extremity Assessment Lower Extremity Assessment: Generalized weakness       Communication   Communication: No difficulties  Cognition Arousal/Alertness: Awake/alert Behavior During Therapy: WFL for tasks assessed/performed Overall Cognitive Status: Within Functional Limits for tasks assessed                                          General Comments      Exercises     Assessment/Plan    PT Assessment Patient needs continued PT services  PT Problem List Decreased strength;Decreased mobility;Decreased activity tolerance;Decreased balance       PT Treatment Interventions DME instruction;Therapeutic exercise;Gait training;Balance training;Manual techniques;Stair training;Neuromuscular re-education;Therapeutic activities;Patient/family education;Functional mobility training    PT Goals (Current goals can be found in the Care Plan section)  Acute Rehab PT Goals Patient Stated Goal: return home PT Goal Formulation: With patient Time For Goal Achievement: 12/26/22 Potential to Achieve Goals: Good    Frequency Min 3X/week     Co-evaluation               AM-PAC PT "6 Clicks" Mobility  Outcome Measure Help needed turning from  your back to your side while in a flat bed without using bedrails?: None Help needed moving from lying on your back to sitting on the side of a flat bed without using bedrails?: None Help needed moving to and from a bed to a chair (including a wheelchair)?: A Lot Help needed standing up from a chair using your arms (e.g., wheelchair or bedside chair)?: A Little Help needed to walk in hospital room?: A Lot Help needed climbing 3-5 steps with a railing? : A Lot 6 Click Score: 17    End of Session Equipment Utilized During Treatment: Gait belt Activity Tolerance: Patient tolerated treatment well;Patient limited by fatigue Patient left: in bed;with family/visitor present;with call bell/phone within reach Nurse Communication: Mobility status PT Visit Diagnosis: Unsteadiness on feet (R26.81);Other abnormalities of gait and mobility (R26.89);Muscle weakness (generalized) (M62.81);Repeated falls (R29.6)    Time: 9563-8756 PT Time Calculation (min) (ACUTE ONLY): 33 min   Charges:   PT Evaluation $PT Eval Low Complexity: 1 Low PT Treatments $Therapeutic Activity: 23-37 mins        2:19 PM, 12/12/22 Mearl Latin PT, DPT  Physical Therapist at [facility]

## 2022-12-12 NOTE — Care Management Obs Status (Signed)
[place] NOTIFICATION   Patient Details  Name: Wanda Shields MRN: [id number] Date of Birth: 03-31-1930   Medicare Observation Status Notification Given:  Yes    Boneta Lucks, RN 12/12/2022, 4:35 PM

## 2022-12-12 NOTE — TOC Progression Note (Signed)
Transition of Care [facility]) - Progression Note    Patient Details  Name: Wanda Shields MRN: [id number] Date of Birth: 1930-09-27  [facility]) CM/SW Contact  Boneta Lucks, RN Phone Number: 12/12/2022, 1:59 PM  Clinical Narrative:   Patient admitted with Generalized weakness. PT eval in pending, TOC to follow for recommendations.     Barriers to Discharge: Continued Medical Work up  Expected Discharge Plan and Services       Living arrangements for the past 2 months: [place] Determinants of Health (SDOH) Interventions [place]: Food Insecurity Present (12/12/2022)  Housing: Low Risk  (12/12/2022)  Transportation Needs: No Transportation Needs (12/12/2022)  Utilities: Not At Risk (12/12/2022)  Tobacco Use: Low Risk  (12/11/2022)    Readmission Risk Interventions     No data to display

## 2022-12-12 NOTE — Hospital Course (Signed)
Wanda Shields is a 88 y.o. female with medical history significant for diabetes mellitus type 2, macular degeneration presented hospital with generalized weakness with falls.  Patient normally ambulates with the help of walker but had to use a wheelchair due to increasing weakness.  She has had a fall on Christmas Day since then she has had increasing weakness and had also hit her head.  In the ED vitals were stable.  Labs showed hyponatremia with sodium of 126.  Urinary analysis showed some UTI with positive nitrates leukocytes chest x-ray was negative for any acute findings.  Fosfomycin was given in the ED due to UTI concerning for penicillin allergy.  Patient was then considered for admission to hospital due to worsening debility and weakness.    Assessment and plan.  Generalized weakness/ falls.   Likely secondary to increasing debility advanced age possible UTI.  At baseline uses walker.  Patient is prone to falls.  Will get PT OT evaluation.  IV fluid was given in the ED for possible volume depletion.  COVID influenza and RSV negative.   UTI (urinary tract infection) Previous history of pansensitive E. coli.  Will continue with Rocephin for now.  Follow urine cultures  Hyponatremia Appears chronic hyponatremia.  Around baseline at this time.  Pending urinary sodium, urinary osmolality, TSH, serum osmolality.    HTN (hypertension) Continue lisinopril, continue to monitor.  DM (diabetes mellitus) ([place]) Continue sliding scale insulin, 86, diabetic diet.  Hold metformin.  Check hemoglobin A1c.

## 2022-12-12 NOTE — Plan of Care (Signed)
  Problem: Coping: Goal: Ability to adjust to condition or change in health will improve Outcome: Progressing   

## 2022-12-13 DIAGNOSIS — E876 Hypokalemia: Secondary | ICD-10-CM | POA: Insufficient documentation

## 2022-12-13 DIAGNOSIS — I10 Essential (primary) hypertension: Secondary | ICD-10-CM | POA: Diagnosis not present

## 2022-12-13 DIAGNOSIS — N3000 Acute cystitis without hematuria: Secondary | ICD-10-CM | POA: Diagnosis not present

## 2022-12-13 DIAGNOSIS — E871 Hypo-osmolality and hyponatremia: Secondary | ICD-10-CM | POA: Diagnosis not present

## 2022-12-13 DIAGNOSIS — R531 Weakness: Secondary | ICD-10-CM | POA: Diagnosis not present

## 2022-12-13 LAB — HEMOGLOBIN A1C
Hgb A1c MFr Bld: 7.8 % — ABNORMAL HIGH (ref 4.8–5.6)
Mean Plasma Glucose: 177 mg/dL

## 2022-12-13 LAB — GLUCOSE, CAPILLARY
Glucose-Capillary: 178 mg/dL — ABNORMAL HIGH (ref 70–99)
Glucose-Capillary: 138 mg/dL — ABNORMAL HIGH (ref 70–99)
Glucose-Capillary: 200 mg/dL — ABNORMAL HIGH (ref 70–99)
Glucose-Capillary: 151 mg/dL — ABNORMAL HIGH (ref 70–99)

## 2022-12-13 LAB — BASIC METABOLIC PANEL
Anion gap: 6 (ref 5–15)
BUN: 8 mg/dL (ref 8–23)
CO2: 22 mmol/L (ref 22–32)
Calcium: 8.4 mg/dL — ABNORMAL LOW (ref 8.9–10.3)
Chloride: 110 mmol/L (ref 98–111)
Creatinine, Ser: 0.69 mg/dL (ref 0.44–1.00)
GFR, Estimated: >60 mL/min (ref >60)
Glucose, Bld: 159 mg/dL — ABNORMAL HIGH (ref 70–99)
Potassium: 3.4 mmol/L — ABNORMAL LOW (ref 3.5–5.1)
Sodium: 138 mmol/L (ref 135–145)

## 2022-12-13 LAB — MAGNESIUM: Magnesium: 1.7 mg/dL (ref 1.7–2.4)

## 2022-12-13 MED ORDER — POTASSIUM CHLORIDE CRYS ER 20 MEQ PO TBCR
40.0000 meq | EXTENDED_RELEASE_TABLET | Freq: Once | ORAL | Status: AC
  Administered 2022-12-13: 40 meq via ORAL
  Filled 2022-12-13: qty 2

## 2022-12-13 NOTE — Progress Notes (Addendum)
PROGRESS NOTE    Wanda Shields  FUX:[id number] DOB: 10-13-1930 DOA: 12/11/2022 PCP: Manon Hilding, MD    Brief Narrative:  Wanda Shields is a 88 y.o. female with medical history significant for diabetes mellitus type 2, macular degeneration presented to the hospital with generalized weakness with falls.  Patient normally ambulates with the help of walker but had to use a wheelchair due to increasing weakness.  She has had a fall on Christmas Day since then she has had increasing weakness and had also hit her head.  In the ED, vitals were stable.  Labs showed hyponatremia with sodium of 126.  Urinary analysis showed some UTI with positive nitrates leukocytes chest x-ray was negative for any acute findings.  Fosfomycin was given in the ED due to UTI concerning for penicillin allergy.  Patient was then considered for admission to hospital due to worsening debility and weakness.    Assessment and plan.  Generalized weakness/ falls.   Likely secondary to increasing debility advanced age possible UTI.  At baseline uses walker.  Patient is prone to falls.  Physical therapy has seen the patient at this time and recommend skilled nursing facility.  IV fluid was given in the ED for possible volume depletion.  COVID influenza and RSV negative.   Gram-negative UTI (urinary tract infection) Previous history of pansensitive E. coli.  Received 1 dose of fosfomycin in the ED.  We will hold off with her antibiotic.  No leukocytosis or fever  Hyponatremia Has history of chronic hyponatremia..  Sodium level has improved to 138 at this time.  Hypokalemia.  Will replace orally.  Check levels in AM.    HTN (hypertension) Continue lisinopril, hold for low blood pressure.  DM (diabetes mellitus) ([place]) Continue sliding scale insulin,diabetic diet.  Hold metformin.  Hemoglobin A1c of 7.8.      DVT prophylaxis: enoxaparin (LOVENOX) injection 40 mg Start: 12/12/22 1000   Code Status:     Code Status: Full  Code  Disposition: Skilled nursing facility placement as per PT evaluation.  Status is: Observation  The patient will require care spanning > 2 midnights and should be moved to inpatient because: Generalized weakness, ambulatory dysfunction, need for rehabilitation   Family Communication: None at bedside.  Spoke with the patient's daughter Ms. Manuela Schwartz on the phone and updated her about the clinical condition of the patient.  Consultants:  None  Procedures:  None  Antimicrobials:  Fosfomycin x 1 in the ED.  Anti-infectives (From admission, onward)    Start     Dose/Rate Route Frequency Ordered Stop   12/11/22 1945  cefTRIAXone (ROCEPHIN) 1 g in sodium chloride 0.9 % 100 mL IVPB  Status:  Discontinued        1 g 200 mL/hr over 30 Minutes Intravenous Every 24 hours 12/11/22 1930 12/12/22 1253   12/11/22 1615  fosfomycin (MONUROL) packet 3 g        3 g Oral  Once 12/11/22 1609 12/11/22 1637   12/11/22 1515  cefTRIAXone (ROCEPHIN) 1 g in sodium chloride 0.9 % 100 mL IVPB  Status:  Discontinued        1 g 200 mL/hr over 30 Minutes Intravenous  Once 12/11/22 1500 12/11/22 1609        Subjective: Today, patient was seen and examined at bedside.  Denies any pain, nausea, vomiting, fever.  States that she just feels weird.  Sitting up on the bedside chair.    Objective: Vitals:   12/12/22 1422  12/12/22 2021 12/13/22 0455 12/13/22 0811  BP: 108/62 119/70 134/67 103/69  Pulse: 71 (!) 56 (!) 56   Resp: '18 19 20   '$ Temp: (!) 97.5 F (36.4 C) 98 F (36.7 C) 97.8 F (36.6 C)   TempSrc: Oral Oral Oral   SpO2: 98% 97% 97%   Weight:      Height:        Intake/Output Summary (Last 24 hours) at 12/13/2022 1121 Last data filed at 12/13/2022 1015 Gross per 24 hour  Intake 120 ml  Output 750 ml  Net -630 ml    Filed Weights   12/11/22 1208 12/11/22 2300  Weight: 62.1 kg 60.2 kg    Physical Examination: Body mass index is 20.18 kg/m.  General: Thinly built, elderly female,  not in obvious distress. HENT:   No scleral pallor or icterus noted. Oral mucosa is moist.  Chest:  Clear breath sounds.   No crackles or wheezes.  CVS: S1 &S2 heard. No murmur.  Regular rate and rhythm. Abdomen: Soft, nontender, nondistended.  Bowel sounds are heard.   Extremities: No cyanosis, clubbing or edema.  Peripheral pulses are palpable. Psych: Alert, awake and Communicative, normal mood. CNS:  No cranial nerve deficits.  Generalized weakness noted. Skin: Warm and dry.  No rashes noted.  Data Reviewed:   CBC: Recent Labs  Lab 12/11/22 1237 12/12/22 1227  WBC 8.6 8.2  HGB 11.1* 11.3*  HCT 34.1* 35.2*  MCV 93.9 95.4  PLT 178 151     Basic Metabolic Panel: Recent Labs  Lab 12/11/22 1237 12/12/22 0316 12/13/22 0527  NA 126* 136 138  K 4.0 3.8 3.4*  CL 97* 107 110  CO2 '22 24 22  '$ GLUCOSE 270* 139* 159*  BUN '14 10 8  '$ CREATININE 0.82 0.72 0.69  CALCIUM 8.7* 8.4* 8.4*  MG  --   --  1.7     Liver Function Tests: No results for input(s): "AST", "ALT", "ALKPHOS", "BILITOT", "PROT", "ALBUMIN" in the last 168 hours.   Radiology Studies: DG Chest Portable 1 View  Result Date: 12/11/2022 CLINICAL DATA:  Generalized weakness, nausea, fall EXAM: PORTABLE CHEST 1 VIEW COMPARISON:  06/02/2022 FINDINGS: Cardiac size is within normal limits. Lung fields are clear of any pulmonary edema or new focal infiltrates. There is no pleural effusion or pneumothorax. Old healed left rib fractures have not changed significantly. IMPRESSION: No active disease. Electronically Signed   By: Elmer Picker M.D.   On: 12/11/2022 13:52      LOS: 0 days    Flora Lipps, MD [facility] Available via Epic secure chat 7am-7pm After these hours, please refer to coverage provider listed on [email or web address] 12/13/2022, 11:21 AM

## 2022-12-13 NOTE — Progress Notes (Signed)
Patient states that she feels weird this morning. Asked patient what she feels like and she states she does not know. MD Pokhrel notified.

## 2022-12-13 NOTE — Progress Notes (Signed)
Patient states that the back of her head is hurting from the fall that she had several days ago. Patient was worried that she had a knot on the back of her head. Patient does not have a knot on the back of her head. MD Pokhrel notified.

## 2022-12-14 DIAGNOSIS — E871 Hypo-osmolality and hyponatremia: Secondary | ICD-10-CM | POA: Diagnosis not present

## 2022-12-14 DIAGNOSIS — R531 Weakness: Secondary | ICD-10-CM | POA: Diagnosis not present

## 2022-12-14 DIAGNOSIS — I10 Essential (primary) hypertension: Secondary | ICD-10-CM | POA: Diagnosis not present

## 2022-12-14 DIAGNOSIS — N3000 Acute cystitis without hematuria: Secondary | ICD-10-CM | POA: Diagnosis not present

## 2022-12-14 LAB — URINE CULTURE: Culture: >=100000 — AB

## 2022-12-14 LAB — GLUCOSE, CAPILLARY
Glucose-Capillary: 208 mg/dL — ABNORMAL HIGH (ref 70–99)
Glucose-Capillary: 260 mg/dL — ABNORMAL HIGH (ref 70–99)
Glucose-Capillary: 260 mg/dL — ABNORMAL HIGH (ref 70–99)
Glucose-Capillary: 222 mg/dL — ABNORMAL HIGH (ref 70–99)

## 2022-12-14 LAB — BASIC METABOLIC PANEL
Anion gap: 6 (ref 5–15)
BUN: 10 mg/dL (ref 8–23)
CO2: 23 mmol/L (ref 22–32)
Calcium: 8.4 mg/dL — ABNORMAL LOW (ref 8.9–10.3)
Chloride: 105 mmol/L (ref 98–111)
Creatinine, Ser: 0.68 mg/dL (ref 0.44–1.00)
GFR, Estimated: >60 mL/min (ref >60)
Glucose, Bld: 207 mg/dL — ABNORMAL HIGH (ref 70–99)
Potassium: 3.7 mmol/L (ref 3.5–5.1)
Sodium: 134 mmol/L — ABNORMAL LOW (ref 135–145)

## 2022-12-14 LAB — OSMOLALITY, URINE: Osmolality, Ur: 448 mOsm/kg (ref 300–900)

## 2022-12-14 MED ORDER — MAGNESIUM CITRATE PO SOLN
0.5000 | Freq: Once | ORAL | Status: AC
  Administered 2022-12-14: 0.5 via ORAL
  Filled 2022-12-14: qty 296

## 2022-12-14 MED ORDER — DOCUSATE SODIUM 100 MG PO CAPS
100.0000 mg | ORAL_CAPSULE | Freq: Two times a day (BID) | ORAL | Status: DC
  Administered 2022-12-14 (×2): 100 mg via ORAL
  Filled 2022-12-14 (×3): qty 1

## 2022-12-14 NOTE — Progress Notes (Signed)
PROGRESS NOTE    [name]  OIN:[id number] DOB: 07/22/1930 DOA: 12/11/2022 PCP: Manon Hilding, MD    Brief Narrative:  Wanda Shields is a 88 y.o. female with medical history significant for diabetes mellitus type 2, macular degeneration presented to the hospital with generalized weakness with falls.  Patient normally ambulates with the help of walker but had to use a wheelchair due to increasing weakness.  She has had a fall on Christmas Day since then she has had increasing weakness and had also hit her head.  In the ED, vitals were stable.  Labs showed hyponatremia with sodium of 126.  Urinary analysis showed some UTI with positive nitrates leukocytes chest x-ray was negative for any acute findings.  Fosfomycin was given in the ED due to UTI concerning for penicillin allergy.  Patient was then considered for admission to hospital due to worsening debility and weakness.    Assessment and plan.  Generalized weakness/ falls.   Likely secondary to increasing debility advanced age possible UTI.  At baseline uses walker.  Patient is prone to falls.  Physical therapy has seen the patient at this time and recommend skilled nursing facility.   COVID influenza and RSV negative.   E. coli UTI (urinary tract infection) Previous history of pansensitive E. coli.  Received 1 dose of fosfomycin in the ED.  We will hold off with her antibiotic.  No leukocytosis or fever.  Currently asymptomatic.  Constipation.  Had Dulcolax without response.  Will give half bottle of magnesium citrate today.  Add stool softeners.  Hyponatremia Has history of chronic hyponatremia..  Sodium level has improved to 134  Hypokalemia.  Replacement.  Latest potassium of 3.7.  HTN (hypertension) Continue lisinopril, hold for low blood pressure.  Holding parameters in place.  DM (diabetes mellitus) ([place]) Continue sliding scale insulin,diabetic diet.  Hold metformin.  Hemoglobin A1c of 7.8.  Adjust POC glucose of 208.       DVT prophylaxis: enoxaparin (LOVENOX) injection 40 mg Start: 12/12/22 1000   Code Status:     Code Status: Full Code  Disposition: Skilled nursing facility placement as per PT evaluation.  Status is: Observation  The patient will require care spanning > 2 midnights and should be moved to inpatient because: Generalized weakness, ambulatory dysfunction, need for rehabilitation   Family Communication: .  Spoke with the patient's daughter Ms. Manuela Schwartz on the phone on 12/13/2022.  Consultants:  None  Procedures:  None  Antimicrobials:  Fosfomycin x 1 in the ED.  Anti-infectives (From admission, onward)    Start     Dose/Rate Route Frequency Ordered Stop   12/11/22 1945  cefTRIAXone (ROCEPHIN) 1 g in sodium chloride 0.9 % 100 mL IVPB  Status:  Discontinued        1 g 200 mL/hr over 30 Minutes Intravenous Every 24 hours 12/11/22 1930 12/12/22 1253   12/11/22 1615  fosfomycin (MONUROL) packet 3 g        3 g Oral  Once 12/11/22 1609 12/11/22 1637   12/11/22 1515  cefTRIAXone (ROCEPHIN) 1 g in sodium chloride 0.9 % 100 mL IVPB  Status:  Discontinued        1 g 200 mL/hr over 30 Minutes Intravenous  Once 12/11/22 1500 12/11/22 1609       Subjective: Today, patient was seen and examined at bedside.  Denies any nausea, vomiting, fever, chills or rigor.  He however complains of constipation and states that Dulcolax did not help her.  Objective: Vitals:   12/13/22 1357 12/13/22 2008 12/14/22 0409 12/14/22 0855  BP: 120/62 130/71 (!) 159/70 (!) 148/63  Pulse: 70 69 71   Resp: '20 20 20   '$ Temp: 98.3 F (36.8 C) 97.6 F (36.4 C) 97.6 F (36.4 C)   TempSrc: Oral Oral Oral   SpO2: 100% 97% 100%   Weight:      Height:        Intake/Output Summary (Last 24 hours) at 12/14/2022 0958 Last data filed at 12/14/2022 0406 Gross per 24 hour  Intake 600 ml  Output 150 ml  Net 450 ml    Filed Weights   12/11/22 1208 12/11/22 2300  Weight: 62.1 kg 60.2 kg    Physical  Examination: Body mass index is 20.18 kg/m.   General: Thinly built, alert awake and Communicative. HENT:   No scleral pallor or icterus noted. Oral mucosa is moist.  Chest:  Clear breath sounds.   Decreased breath sounds bilaterally. CVS: S1 &S2 heard. No murmur.  Regular rate and rhythm. Abdomen: Soft, nontender, nondistended.  Bowel sounds are heard.   Extremities: No cyanosis, clubbing or edema.  Peripheral pulses are palpable. Psych: Alert, awake and Communicative, normal mood. CNS:  No cranial nerve deficits.  Generalized weakness noted. Skin: Warm and dry.  No rashes noted.  Data Reviewed:   CBC: Recent Labs  Lab 12/11/22 1237 12/12/22 1227  WBC 8.6 8.2  HGB 11.1* 11.3*  HCT 34.1* 35.2*  MCV 93.9 95.4  PLT 178 151     Basic Metabolic Panel: Recent Labs  Lab 12/11/22 1237 12/12/22 0316 12/13/22 0527 12/14/22 0526  NA 126* 136 138 134*  K 4.0 3.8 3.4* 3.7  CL 97* 107 110 105  CO2 '22 24 22 23  '$ GLUCOSE 270* 139* 159* 207*  BUN '14 10 8 10  '$ CREATININE 0.82 0.72 0.69 0.68  CALCIUM 8.7* 8.4* 8.4* 8.4*  MG  --   --  1.7  --      Liver Function Tests: No results for input(s): "AST", "ALT", "ALKPHOS", "BILITOT", "PROT", "ALBUMIN" in the last 168 hours.   Radiology Studies: No results found.    LOS: 0 days    Flora Lipps, MD [facility] Available via Epic secure chat 7am-7pm After these hours, please refer to coverage provider listed on [email or web address] 12/14/2022, 9:58 AM

## 2022-12-14 NOTE — Progress Notes (Signed)
Patient complained of being constipated. PRN miralax given

## 2022-12-15 ENCOUNTER — Observation Stay (HOSPITAL_COMMUNITY): Payer: Medicare Other

## 2022-12-15 DIAGNOSIS — M25552 Pain in left hip: Secondary | ICD-10-CM | POA: Diagnosis not present

## 2022-12-15 DIAGNOSIS — N3000 Acute cystitis without hematuria: Secondary | ICD-10-CM | POA: Diagnosis not present

## 2022-12-15 DIAGNOSIS — I10 Essential (primary) hypertension: Secondary | ICD-10-CM | POA: Diagnosis not present

## 2022-12-15 DIAGNOSIS — M25562 Pain in left knee: Secondary | ICD-10-CM | POA: Diagnosis not present

## 2022-12-15 DIAGNOSIS — E871 Hypo-osmolality and hyponatremia: Secondary | ICD-10-CM | POA: Diagnosis not present

## 2022-12-15 DIAGNOSIS — R531 Weakness: Secondary | ICD-10-CM | POA: Diagnosis not present

## 2022-12-15 LAB — GLUCOSE, CAPILLARY
Glucose-Capillary: 207 mg/dL — ABNORMAL HIGH (ref 70–99)
Glucose-Capillary: 182 mg/dL — ABNORMAL HIGH (ref 70–99)
Glucose-Capillary: 217 mg/dL — ABNORMAL HIGH (ref 70–99)
Glucose-Capillary: 234 mg/dL — ABNORMAL HIGH (ref 70–99)

## 2022-12-15 NOTE — Progress Notes (Signed)
PROGRESS NOTE    Wanda Shields  XBL:[id number] DOB: 1930/09/26 DOA: 12/11/2022 PCP: Manon Hilding, MD    Brief Narrative:  Wanda Shields is a 89 y.o. female with medical history significant for diabetes mellitus type 2, macular degeneration presented to the hospital with generalized weakness with falls.  Patient normally ambulates with the help of walker but had to use a wheelchair due to increasing weakness.  She has had a fall on Christmas Day since then she has had increasing weakness and had also hit her head.  In the ED, vitals were stable.  Labs showed hyponatremia with sodium of 126.  Urinary analysis showed some UTI with positive nitrates leukocytes, chest x-ray was negative for any acute findings.  Fosfomycin was given in the ED due to UTI concerning for penicillin allergy.  Patient was then considered for admission to hospital due to worsening debility and weakness.    During hospitalization, patient was seen by physical therapy and has been recommended skilled nursing facility placement.  Currently medically stable awaiting for skilled nursing facility  Assessment and plan.  Generalized weakness/ falls.   Likely secondary to increasing debility advanced age, UTI.   COVID influenza and RSV negative.At baseline uses walker with fall risk.   Physical therapy has seen the patient at this time and recommend skilled nursing facility.    Left knee, hip pain.  X-ray of the left hip and knee done.  No fracture noted.  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  Symptomatic treatment.   E. coli UTI (urinary tract infection)  Received 1 dose of fosfomycin in the ED.   No leukocytosis, fever or urinary symptoms.  Constipation.  Continue bowel regimen.  Hyponatremia Has history of chronic hyponatremia.  Latest sodium level at 134.  Hypokalemia.  Improved after replacement.  Latest potassium of 3.7.  HTN (hypertension) Continue lisinopril with holding parameters.  DM (diabetes mellitus)  ([place]) Continue sliding scale insulin,diabetic diet.  Hold metformin.  Hemoglobin A1c of 7.8.  Latest POC glucose of 207.  DVT prophylaxis: enoxaparin (LOVENOX) injection 40 mg Start: 12/12/22 1000   Code Status:     Code Status: Full Code  Disposition: Skilled nursing facility placement.  Medically stable for disposition.  Status is: Observation  The patient will require care spanning > 2 midnights and should be moved to inpatient because: Generalized weakness, ambulatory dysfunction, need for rehabilitation   Family Communication: .   Spoke with the patient's daughter Ms. Manuela Schwartz on the phone on 12/13/2022.  Consultants:  None  Procedures:  None  Antimicrobials:  Fosfomycin x 1 in the ED.  Anti-infectives (From admission, onward)    Start     Dose/Rate Route Frequency Ordered Stop   12/11/22 1945  cefTRIAXone (ROCEPHIN) 1 g in sodium chloride 0.9 % 100 mL IVPB  Status:  Discontinued        1 g 200 mL/hr over 30 Minutes Intravenous Every 24 hours 12/11/22 1930 12/12/22 1253   12/11/22 1615  fosfomycin (MONUROL) packet 3 g        3 g Oral  Once 12/11/22 1609 12/11/22 1637   12/11/22 1515  cefTRIAXone (ROCEPHIN) 1 g in sodium chloride 0.9 % 100 mL IVPB  Status:  Discontinued        1 g 200 mL/hr over 30 Minutes Intravenous  Once 12/11/22 1500 12/11/22 1609       Subjective: Today, patient was seen and examined at bedside.  Patient denies any nausea vomiting fever chills or rigor  but has some pain over the knee and hip.  Objective: Vitals:   12/14/22 0855 12/14/22 1206 12/14/22 2124 12/15/22 0635  BP: (!) 148/63 122/82 115/70 111/65  Pulse:  80 85 74  Resp:  '18 18 18  '$ Temp:  98.2 F (36.8 C) 98.5 F (36.9 C) 97.8 F (36.6 C)  TempSrc:  Oral Oral   SpO2:  98% 95% 97%  Weight:      Height:        Intake/Output Summary (Last 24 hours) at 12/15/2022 0744 Last data filed at 12/15/2022 0700 Gross per 24 hour  Intake 720 ml  Output --  Net 720 ml    Filed Weights    12/11/22 1208 12/11/22 2300  Weight: 62.1 kg 60.2 kg    Physical Examination: Body mass index is 20.18 kg/m.   General: Thinly built, not in obvious distress, elderly female, medicated HENT:   No scleral pallor or icterus noted. Oral mucosa is moist.  Chest:  Clear breath sounds.  Diminished breath sounds bilaterally. No crackles or wheezes.  CVS: S1 &S2 heard. No murmur.  Regular rate and rhythm. Abdomen: Soft, nontender, nondistended.  Bowel sounds are heard.   Extremities: No cyanosis, clubbing or edema.  Peripheral pulses are palpable.  Left knee with bruising.  Nonspecific tenderness Psych: Alert, awake and oriented, normal mood CNS:  No cranial nerve deficits.  Power equal in all extremities.  Generalized weakness noted Skin: Warm and dry.  No rashes noted.  Bruise over the left lower extremity.   Data Reviewed:   CBC: Recent Labs  Lab 12/11/22 1237 12/12/22 1227  WBC 8.6 8.2  HGB 11.1* 11.3*  HCT 34.1* 35.2*  MCV 93.9 95.4  PLT 178 151     Basic Metabolic Panel: Recent Labs  Lab 12/11/22 1237 12/12/22 0316 12/13/22 0527 12/14/22 0526  NA 126* 136 138 134*  K 4.0 3.8 3.4* 3.7  CL 97* 107 110 105  CO2 '22 24 22 23  '$ GLUCOSE 270* 139* 159* 207*  BUN '14 10 8 10  '$ CREATININE 0.82 0.72 0.69 0.68  CALCIUM 8.7* 8.4* 8.4* 8.4*  MG  --   --  1.7  --      Liver Function Tests: No results for input(s): "AST", "ALT", "ALKPHOS", "BILITOT", "PROT", "ALBUMIN" in the last 168 hours.   Radiology Studies: No results found.    LOS: 0 days    Flora Lipps, MD [facility] Available via Epic secure chat 7am-7pm After these hours, please refer to coverage provider listed on [email or web address] 12/15/2022, 7:44 AM

## 2022-12-15 NOTE — Progress Notes (Signed)
Physical Therapy Treatment Patient Details Name: Wanda Shields MRN: [id number] DOB: 08/27/1930 Today's Date: 12/15/2022   History of Present Illness Wanda Shields is a 89 y.o. female with medical history significant for DM, macular degeneration. Patient presented to the Ed with c/o generalized weakness, and falls. At baseline patient ambulates with a walker, but today she had to use  a wheel chair due to weakness. She lives alone but family members check on her ands she has a life alert necklace.  Patient has been progressively getting weak since Christmas eve.  She fell that day and on Christmas day.  When she fell 4 days ago she barely bumped her head.  Reports when she falls frequently like this, she usually is dehydrated, has  hyponatremia and UTI.  Hence she was brought to the ED.  On my evaluation, patient denies dysuria, no fevers no chills, no cough, no difficulty breathing, vomiting.  No loose stools.  Family reports that patient never has urinary symptoms with a UTIs.    PT Comments    PT very eager and cooperative with therapy but is limited secondary to significant increased pain to Lt hip and knee area.    Recommendations for follow up therapy are one component of a multi-disciplinary discharge planning process, led by the attending physician.  Recommendations may be updated based on patient status, additional functional criteria and insurance authorization.  Follow Up Recommendations  Skilled nursing-short term rehab (<3 hours/day)     Assistance Recommended at Discharge Intermittent Supervision/Assistance  Patient can return home with the following A little help with bathing/dressing/bathroom;Assistance with cooking/housework;Assist for transportation;A lot of help with walking and/or transfers   Equipment Recommendations  None recommended by PT    Recommendations for Other Services       Precautions / Restrictions Precautions Precautions: Fall Restrictions Weight  Bearing Restrictions: No     Mobility  Bed Mobility Overal bed mobility: Modified Independent             General bed mobility comments: slight increased time    Transfers Overall transfer level: Needs assistance Equipment used: Rolling walker (2 wheels) Transfers: Sit to/from Stand Sit to Stand: Max assist           General transfer comment: due to pain in Lt knee/hip area    Ambulation/Gait               General Gait Details: unable at this time        Cognition Arousal/Alertness: Awake/alert Behavior During Therapy: WFL for tasks assessed/performed Overall Cognitive Status: Within Functional Limits for tasks assessed                                          Exercises General Exercises - Lower Extremity Ankle Circles/Pumps: Both, 10 reps Quad Sets: Both, 10 reps Short Arc Quad: Both, 10 reps Long Arc Quad: Both, 10 reps Heel Slides: Both, 10 reps    General Comments        Pertinent Vitals/Pain Pain Assessment Pain Assessment: 0-10 Pain Score: 10-Worst pain ever Pain Location: Lt hip and knee Pain Descriptors / Indicators: Shooting, Sharp Pain Intervention(s): Limited activity within patient's tolerance       Prior Function   Has and aide; ambulates in home with RW          PT Goals (current goals can now be found in the  care plan section) Acute Rehab PT Goals Patient Stated Goal: return home PT Goal Formulation: With patient Time For Goal Achievement: 12/26/22 Potential to Achieve Goals: Good Progress towards PT goals: Not progressing toward goals - comment (due to pain)    Frequency    Min 3X/week      PT Plan Current plan remains appropriate          End of Session Equipment Utilized During Treatment: Gait belt Activity Tolerance: Patient limited by pain Patient left: in bed;with family/visitor present;with call bell/phone within reach Nurse Communication: Mobility status PT Visit Diagnosis:  Unsteadiness on feet (R26.81);Other abnormalities of gait and mobility (R26.89);Muscle weakness (generalized) (M62.81);Repeated falls (R29.6)     Time: 2297-9892 PT Time Calculation (min) (ACUTE ONLY): 34 min  Charges:  $Therapeutic Exercise: 8-22 mins $Therapeutic Activity: 8-22 mins                      Rayetta Humphrey, PT CLT [phone]  12/15/2022, 11:56 AM

## 2022-12-15 NOTE — Progress Notes (Signed)
Assisted to bedside commode twice this morning.  First bm was medium and soft, second was diarrhea.  Held colace.  Very weak during assist.  Alert and oriented.

## 2022-12-16 DIAGNOSIS — N1831 Chronic kidney disease, stage 3a: Secondary | ICD-10-CM | POA: Diagnosis not present

## 2022-12-16 DIAGNOSIS — M810 Age-related osteoporosis without current pathological fracture: Secondary | ICD-10-CM | POA: Diagnosis not present

## 2022-12-16 DIAGNOSIS — Z794 Long term (current) use of insulin: Secondary | ICD-10-CM | POA: Diagnosis not present

## 2022-12-16 DIAGNOSIS — N390 Urinary tract infection, site not specified: Secondary | ICD-10-CM | POA: Diagnosis not present

## 2022-12-16 DIAGNOSIS — E876 Hypokalemia: Secondary | ICD-10-CM | POA: Diagnosis not present

## 2022-12-16 DIAGNOSIS — I10 Essential (primary) hypertension: Secondary | ICD-10-CM | POA: Diagnosis not present

## 2022-12-16 DIAGNOSIS — R627 Adult failure to thrive: Secondary | ICD-10-CM | POA: Diagnosis not present

## 2022-12-16 DIAGNOSIS — R278 Other lack of coordination: Secondary | ICD-10-CM | POA: Diagnosis not present

## 2022-12-16 DIAGNOSIS — Z741 Need for assistance with personal care: Secondary | ICD-10-CM | POA: Diagnosis not present

## 2022-12-16 DIAGNOSIS — H3530 Unspecified macular degeneration: Secondary | ICD-10-CM | POA: Diagnosis not present

## 2022-12-16 DIAGNOSIS — Z8673 Personal history of transient ischemic attack (TIA), and cerebral infarction without residual deficits: Secondary | ICD-10-CM | POA: Diagnosis not present

## 2022-12-16 DIAGNOSIS — R2681 Unsteadiness on feet: Secondary | ICD-10-CM | POA: Diagnosis not present

## 2022-12-16 DIAGNOSIS — R531 Weakness: Secondary | ICD-10-CM | POA: Diagnosis not present

## 2022-12-16 DIAGNOSIS — I152 Hypertension secondary to endocrine disorders: Secondary | ICD-10-CM | POA: Diagnosis not present

## 2022-12-16 DIAGNOSIS — N3000 Acute cystitis without hematuria: Secondary | ICD-10-CM | POA: Diagnosis not present

## 2022-12-16 DIAGNOSIS — Z66 Do not resuscitate: Secondary | ICD-10-CM | POA: Diagnosis not present

## 2022-12-16 DIAGNOSIS — J121 Respiratory syncytial virus pneumonia: Secondary | ICD-10-CM | POA: Diagnosis not present

## 2022-12-16 DIAGNOSIS — N3090 Cystitis, unspecified without hematuria: Secondary | ICD-10-CM | POA: Diagnosis not present

## 2022-12-16 DIAGNOSIS — M170 Bilateral primary osteoarthritis of knee: Secondary | ICD-10-CM | POA: Diagnosis not present

## 2022-12-16 DIAGNOSIS — Z7984 Long term (current) use of oral hypoglycemic drugs: Secondary | ICD-10-CM | POA: Diagnosis not present

## 2022-12-16 DIAGNOSIS — R488 Other symbolic dysfunctions: Secondary | ICD-10-CM | POA: Diagnosis not present

## 2022-12-16 DIAGNOSIS — E1122 Type 2 diabetes mellitus with diabetic chronic kidney disease: Secondary | ICD-10-CM | POA: Diagnosis not present

## 2022-12-16 DIAGNOSIS — E871 Hypo-osmolality and hyponatremia: Secondary | ICD-10-CM | POA: Diagnosis not present

## 2022-12-16 DIAGNOSIS — M6281 Muscle weakness (generalized): Secondary | ICD-10-CM | POA: Diagnosis not present

## 2022-12-16 DIAGNOSIS — E1159 Type 2 diabetes mellitus with other circulatory complications: Secondary | ICD-10-CM | POA: Diagnosis not present

## 2022-12-16 DIAGNOSIS — R2689 Other abnormalities of gait and mobility: Secondary | ICD-10-CM | POA: Diagnosis not present

## 2022-12-16 DIAGNOSIS — Z9181 History of falling: Secondary | ICD-10-CM | POA: Diagnosis not present

## 2022-12-16 DIAGNOSIS — E119 Type 2 diabetes mellitus without complications: Secondary | ICD-10-CM | POA: Diagnosis not present

## 2022-12-16 DIAGNOSIS — E1165 Type 2 diabetes mellitus with hyperglycemia: Secondary | ICD-10-CM | POA: Diagnosis not present

## 2022-12-16 DIAGNOSIS — R262 Difficulty in walking, not elsewhere classified: Secondary | ICD-10-CM | POA: Diagnosis not present

## 2022-12-16 DIAGNOSIS — Z7982 Long term (current) use of aspirin: Secondary | ICD-10-CM | POA: Diagnosis not present

## 2022-12-16 DIAGNOSIS — Z1152 Encounter for screening for COVID-19: Secondary | ICD-10-CM | POA: Diagnosis not present

## 2022-12-16 DIAGNOSIS — Z79899 Other long term (current) drug therapy: Secondary | ICD-10-CM | POA: Diagnosis not present

## 2022-12-16 DIAGNOSIS — M159 Polyosteoarthritis, unspecified: Secondary | ICD-10-CM | POA: Diagnosis not present

## 2022-12-16 LAB — CBC
HCT: 33.4 % — ABNORMAL LOW (ref 36.0–46.0)
Hemoglobin: 10.9 g/dL — ABNORMAL LOW (ref 12.0–15.0)
MCH: 31.1 pg (ref 26.0–34.0)
MCHC: 32.6 g/dL (ref 30.0–36.0)
MCV: 95.4 fL (ref 80.0–100.0)
Platelets: 214 10*3/uL (ref 150–400)
RBC: 3.5 MIL/uL — ABNORMAL LOW (ref 3.87–5.11)
RDW: 14.5 % (ref 11.5–15.5)
WBC: 8.9 10*3/uL (ref 4.0–10.5)
nRBC: 0 % (ref 0.0–0.2)

## 2022-12-16 LAB — GLUCOSE, CAPILLARY
Glucose-Capillary: 178 mg/dL — ABNORMAL HIGH (ref 70–99)
Glucose-Capillary: 209 mg/dL — ABNORMAL HIGH (ref 70–99)
Glucose-Capillary: 361 mg/dL — ABNORMAL HIGH (ref 70–99)

## 2022-12-16 MED ORDER — POLYETHYLENE GLYCOL 3350 17 G PO PACK: 17.0000 g | PACK | Freq: Every day | ORAL | Status: AC | PRN

## 2022-12-16 MED ORDER — LISINOPRIL 10 MG PO TABS: 5.0000 mg | ORAL_TABLET | Freq: Every day | ORAL | Status: DC

## 2022-12-16 NOTE — Discharge Summary (Signed)
Physician Discharge Summary  MAHATHI POKORNEY UKG:[id number] DOB: 1930/02/10 DOA: 12/11/2022  PCP: Manon Hilding, MD  Admit date: 12/11/2022 Discharge date: 12/16/2022  Admitted From: Home  Discharge disposition: Home   Recommendations for Outpatient Follow-Up:   Follow up with your primary care provider at the skilled nursing facility in 3 to 5 days. Check CBC, BMP, magnesium in the next visit  Discharge Diagnosis:   Principal Problem:   Generalized weakness Active Problems:   Hyponatremia   UTI (urinary tract infection)   DM (diabetes mellitus) (HCC)   HTN (hypertension)   Hypokalemia   Discharge Condition: Improved.  Diet recommendation:  Carbohydrate-modified.    Wound care: None.  Code status: Full.   History of Present Illness:   Wanda Shields is a 89 y.o. female with medical history significant for diabetes mellitus type 2, macular degeneration presented to the hospital with generalized weakness with falls.  Patient normally ambulates with the help of walker but had to use a wheelchair due to increasing weakness.  She has had a fall on Christmas Day since then she has had increasing weakness and had also hit her head.  In the ED, vitals were stable.  Labs showed hyponatremia with sodium of 126.  Urinary analysis showed some UTI with positive nitrates leukocytes, chest x-ray was negative for any acute findings.  Fosfomycin was given in the ED due to UTI concerning for penicillin allergy.  Patient was then considered for admission to hospital due to worsening debility and weakness.  During hospitalization, patient was seen by physical therapy and has been recommended skilled nursing facility placement.    Hospital Course:   Following conditions were addressed during hospitalization as listed below,  Generalized weakness/ falls.   Likely secondary to increasing debility, advanced age, UTI.   COVID influenza and RSV negative. At baseline uses walker with fall risk.    Physical therapy has seen the patient at this time and recommend skilled nursing facility.     Left knee, hip pain.  X-ray of the left hip and knee done.  No fracture noted.  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  Symptomatic treatment.  Apply warm compression.  Has mild superficial bruise over the knee area.  No hemarthrosis.   E. coli UTI (urinary tract infection)  Received 1 dose of fosfomycin in the ED.   No leukocytosis, fever or urinary symptoms.   Constipation.  Continue bowel regimen.-MiraLAX on discharge   Hyponatremia Has history of chronic hyponatremia.  Latest sodium level at 134.  Check BMP periodically   Hypokalemia.  Improved after replacement.  Latest potassium of 3.7.   HTN (hypertension) Continue lisinopril with holding parameters on discharge.   DM (diabetes mellitus) ([place]) Continue diabetic diet, metformin on discharge.  Hemoglobin A1c of 7.8.    Disposition.  At this time, patient is stable for disposition to skilled nursing facility.  Medical Consultants:   None.  Procedures:    None Subjective:   Today, patient was seen and examined at bedside.  Complains of knee pain and discomfort.  Overall weakness   Discharge Exam:   Vitals:   12/16/22 0453 12/16/22 1252  BP: (!) 144/79 98/61  Pulse: 80 87  Resp: 18   Temp: 98 F (36.7 C) 99 F (37.2 C)  SpO2: 98% 98%   Vitals:   12/15/22 1323 12/15/22 2053 12/16/22 0453 12/16/22 1252  BP: (!) 107/56 (!) 113/57 (!) 144/79 98/61  Pulse: 83 67 80 87  Resp: (!) 22 19  18   Temp: 97.9 F (36.6 C) (!) 97.4 F (36.3 C) 98 F (36.7 C) 99 F (37.2 C)  TempSrc: Oral Oral  Oral  SpO2: 97% 96% 98% 98%  Weight:      Height:       Body mass index is 20.18 kg/m.  General: elderly female, debilitated and weak, appearing HENT:   No scleral pallor or icterus noted. Oral mucosa is moist.  Chest:  Clear breath sounds.  Diminished breath sounds bilaterally. No crackles or wheezes.  CVS: S1 &S2 heard. No murmur.  Regular  rate and rhythm. Abdomen: Soft, nontender, nondistended.  Bowel sounds are heard.   Extremities: Left knee with superficial bruising.  Able to move the knee joint with some tenderness.   Left hip without any bruise. Psych: Alert, awake and oriented, normal mood CNS:  No cranial nerve deficits.  Generalized weakness noted. Skin: Warm and dry.  Superficial skin bruise over the left lower extremity.  The results of significant diagnostics from this hospitalization (including imaging, microbiology, ancillary and laboratory) are listed below for reference.     Diagnostic Studies:   DG Chest Portable 1 View  Result Date: 12/11/2022 CLINICAL DATA:  Generalized weakness, nausea, fall EXAM: PORTABLE CHEST 1 VIEW COMPARISON:  06/02/2022 FINDINGS: Cardiac size is within normal limits. Lung fields are clear of any pulmonary edema or new focal infiltrates. There is no pleural effusion or pneumothorax. Old healed left rib fractures have not changed significantly. IMPRESSION: No active disease. Electronically Signed   By: Elmer Picker M.D.   On: 12/11/2022 13:52     Labs:   Basic Metabolic Panel: Recent Labs  Lab 12/11/22 1237 12/12/22 0316 12/13/22 0527 12/14/22 0526  NA 126* 136 138 134*  K 4.0 3.8 3.4* 3.7  CL 97* 107 110 105  CO2 '22 24 22 23  '$ GLUCOSE 270* 139* 159* 207*  BUN '14 10 8 10  '$ CREATININE 0.82 0.72 0.69 0.68  CALCIUM 8.7* 8.4* 8.4* 8.4*  MG  --   --  1.7  --    GFR Estimated Creatinine Clearance: 44.4 mL/min (by C-G formula based on SCr of 0.68 mg/dL). Liver Function Tests: No results for input(s): "AST", "ALT", "ALKPHOS", "BILITOT", "PROT", "ALBUMIN" in the last 168 hours. No results for input(s): "LIPASE", "AMYLASE" in the last 168 hours. No results for input(s): "AMMONIA" in the last 168 hours. Coagulation profile No results for input(s): "INR", "PROTIME" in the last 168 hours.  CBC: Recent Labs  Lab 12/11/22 1237 12/12/22 1227  WBC 8.6 8.2  HGB 11.1*  11.3*  HCT 34.1* 35.2*  MCV 93.9 95.4  PLT 178 151   Cardiac Enzymes: No results for input(s): "CKTOTAL", "CKMB", "CKMBINDEX", "TROPONINI" in the last 168 hours. BNP: Invalid input(s): "POCBNP" CBG: Recent Labs  Lab 12/15/22 1231 12/15/22 1611 12/15/22 2056 12/16/22 0731 12/16/22 1142  GLUCAP 182* 217* 234* 209* 361*   D-Dimer No results for input(s): "DDIMER" in the last 72 hours. Hgb A1c No results for input(s): "HGBA1C" in the last 72 hours. Lipid Profile No results for input(s): "CHOL", "HDL", "LDLCALC", "TRIG", "CHOLHDL", "LDLDIRECT" in the last 72 hours. Thyroid function studies No results for input(s): "TSH", "T4TOTAL", "T3FREE", "THYROIDAB" in the last 72 hours.  Invalid input(s): "FREET3" Anemia work up No results for input(s): "VITAMINB12", "FOLATE", "FERRITIN", "TIBC", "IRON", "RETICCTPCT" in the last 72 hours. Microbiology Recent Results (from the past 240 hour(s))  Resp panel by RT-PCR (RSV, Flu A&B, Covid) Anterior Nasal Swab  Status: None   Collection Time: 12/11/22  1:30 PM   Specimen: Anterior Nasal Swab  Result Value Ref Range Status   SARS Coronavirus 2 by RT PCR NEGATIVE NEGATIVE Final    Comment: (NOTE) SARS-CoV-2 target nucleic acids are NOT DETECTED.  The SARS-CoV-2 RNA is generally detectable in upper respiratory specimens during the acute phase of infection. The lowest concentration of SARS-CoV-2 viral copies this assay can detect is 138 copies/mL. A negative result does not preclude SARS-Cov-2 infection and should not be used as the sole basis for treatment or other patient management decisions. A negative result may occur with  improper specimen collection/handling, submission of specimen other than nasopharyngeal swab, presence of viral mutation(s) within the areas targeted by this assay, and inadequate number of viral copies(<138 copies/mL). A negative result must be combined with clinical observations, patient history, and  epidemiological information. The expected result is Negative.  Fact Sheet for Patients:  EntrepreneurPulse.com.au  Fact Sheet for Healthcare Providers:  IncredibleEmployment.be  This test is no t yet approved or cleared by the [place] FDA and  has been authorized for detection and/or diagnosis of SARS-CoV-2 by FDA under an Emergency Use Authorization (EUA). This EUA will remain  in effect (meaning this test can be used) for the duration of the COVID-19 declaration under Section 564(b)(1) of the Act, 21 U.S.C.section 360bbb-3(b)(1), unless the authorization is terminated  or revoked sooner.       Influenza A by PCR NEGATIVE NEGATIVE Final   Influenza B by PCR NEGATIVE NEGATIVE Final    Comment: (NOTE) The Xpert Xpress SARS-CoV-2/FLU/RSV plus assay is intended as an aid in the diagnosis of influenza from Nasopharyngeal swab specimens and should not be used as a sole basis for treatment. Nasal washings and aspirates are unacceptable for Xpert Xpress SARS-CoV-2/FLU/RSV testing.  Fact Sheet for Patients: EntrepreneurPulse.com.au  Fact Sheet for Healthcare Providers: IncredibleEmployment.be  This test is not yet approved or cleared by the [place] FDA and has been authorized for detection and/or diagnosis of SARS-CoV-2 by FDA under an Emergency Use Authorization (EUA). This EUA will remain in effect (meaning this test can be used) for the duration of the COVID-19 declaration under Section 564(b)(1) of the Act, 21 U.S.C. section 360bbb-3(b)(1), unless the authorization is terminated or revoked.     Resp Syncytial Virus by PCR NEGATIVE NEGATIVE Final    Comment: (NOTE) Fact Sheet for Patients: EntrepreneurPulse.com.au  Fact Sheet for Healthcare Providers: IncredibleEmployment.be  This test is not yet approved or cleared by the [place] FDA and has been  authorized for detection and/or diagnosis of SARS-CoV-2 by FDA under an Emergency Use Authorization (EUA). This EUA will remain in effect (meaning this test can be used) for the duration of the COVID-19 declaration under Section 564(b)(1) of the Act, 21 U.S.C. section 360bbb-3(b)(1), unless the authorization is terminated or revoked.  Performed at [facility], [address] [place], [place] [address]   Urine Culture     Status: Abnormal   Collection Time: 12/11/22  7:31 PM   Specimen: Urine, Clean Catch  Result Value Ref Range Status   Specimen Description   Final    URINE, CLEAN CATCH Performed at [facility], [address] [place], [place] [address]    Special Requests   Final    NONE Performed at [facility], [address], [place], [place] [address]    Culture >=100,000 COLONIES/mL ESCHERICHIA COLI (A)  Final   Report Status 12/14/2022 FINAL  Final  Organism ID, Bacteria ESCHERICHIA COLI (A)  Final      Susceptibility   Escherichia coli - MIC*    AMPICILLIN >=32 RESISTANT Resistant     CEFAZOLIN 32 INTERMEDIATE Intermediate     CEFEPIME <=0.12 SENSITIVE Sensitive     CEFTRIAXONE <=0.25 SENSITIVE Sensitive     CIPROFLOXACIN >=4 RESISTANT Resistant     GENTAMICIN <=1 SENSITIVE Sensitive     IMIPENEM <=0.25 SENSITIVE Sensitive     NITROFURANTOIN <=16 SENSITIVE Sensitive     TRIMETH/SULFA <=20 SENSITIVE Sensitive     AMPICILLIN/SULBACTAM >=32 RESISTANT Resistant     PIP/TAZO 64 INTERMEDIATE Intermediate     * >=100,000 COLONIES/mL ESCHERICHIA COLI     Discharge Instructions:   Discharge Instructions     Diet Carb Modified   Complete by: As directed    Discharge instructions   Complete by: As directed    Follow-up with your primary care provider at the skilled nursing facility in 3 to 5 days.   Increase activity slowly   Complete by: As directed    No wound care   Complete by: As directed       Allergies as of 12/16/2022       Reactions   Atropine     Demerol [meperidine Hcl]    Penicillins         Medication List     TAKE these medications    acetaminophen 500 MG tablet Commonly known as: TYLENOL Take 1,000 mg by mouth every 6 (six) hours as needed for moderate pain.   alendronate 70 MG tablet Commonly known as: FOSAMAX Take 70 mg by mouth once a week. Sunday   Aspirin 81 MG Caps Take 1 capsule every day by oral route.   GLUCOTROL PO Take 5 mg by mouth in the morning.   HM Lidocaine Patch 4 % Generic drug: lidocaine Place 1 patch onto the skin daily.   lisinopril 10 MG tablet Commonly known as: ZESTRIL Take 0.5 tablets (5 mg total) by mouth daily. Take 5 mg daily, hold for blood pressure less than 100. What changed: additional instructions   meloxicam 15 MG tablet Commonly known as: MOBIC Take 15 mg by mouth daily.   metFORMIN 500 MG tablet Commonly known as: GLUCOPHAGE Take 500-1,000 mg by mouth See admin instructions. Take 500 mg Monday,Tuesday,Wednesday,Friday and Saturday and then 1000 mg on Sunday and Thurday.   nabumetone 500 MG tablet Commonly known as: RELAFEN Take 500 mg by mouth 2 (two) times daily.   ondansetron 8 MG tablet Commonly known as: ZOFRAN Take 8 mg by mouth 3 (three) times daily as needed.   polyethylene glycol 17 g packet Commonly known as: MIRALAX / GLYCOLAX Take 17 g by mouth daily as needed for mild constipation or moderate constipation.   PRESERVISION AREDS PO Take 1 tablet by mouth 2 (two) times daily.   vitamin B-12 500 MCG tablet Commonly known as: CYANOCOBALAMIN Take 500 mcg by mouth daily.   Vitamin D3 10 MCG (400 UNIT) tablet   Voltaren Arthritis Pain 1 % Gel Generic drug: diclofenac Sodium Apply 2 g topically 4 (four) times daily.        Contact information for after-discharge care     [facility] .   Service: Skilled Nursing Contact information: [address]  [place] [phone]  Time coordinating discharge: 39 minutes  Signed:  Trinitie Mcgirr  [facility] 12/16/2022, 1:14 PM

## 2022-12-16 NOTE — TOC Transition Note (Signed)
Transition of Care [facility]) - CM/SW Discharge Note   Patient Details  Name: Wanda Shields MRN: [id number] Date of Birth: [birth year]  Transition of Care [facility]) CM/SW Contact:  Iona Beard, [place] Phone Number: 12/16/2022, 2:03 PM   Clinical Narrative:    CSW confirmed pts insurance auth has been approved at this time. CSW updated Kerri with [facility] who states they can accept pt today. CSW updated MD, D/C completed. CSW updated RN of numbers for room and report. CSW left HIPAA compliant VM for pts daughter. Pts son did not answer. TOC signing off.   Final next level of care: Skilled Nursing Facility Barriers to Discharge: Barriers Resolved   Patient Goals and CMS Choice CMS Medicare.gov Compare Post Acute Care list provided to:: Patient Choice offered to / list presented to : Patient  Discharge Placement                Patient chooses bed at: [facility] Patient to be transferred to facility by: Facility staff Name of family member notified: daughter Raygen Linquist Patient and family notified of of transfer: 12/16/22  Discharge Plan and Services Additional resources added to the After Visit Summary for                                       Social Determinants of Health (SDOH) Interventions [place]: Food Insecurity Present (12/12/2022)  Housing: Low Risk  (12/12/2022)  Transportation Needs: No Transportation Needs (12/12/2022)  Utilities: Not At Risk (12/12/2022)  Tobacco Use: Low Risk  (12/11/2022)     Readmission Risk Interventions     No data to display

## 2022-12-16 NOTE — Progress Notes (Signed)
PROGRESS NOTE    Wanda Shields  IRC:[id number] DOB: 1930/07/04 DOA: 12/11/2022 PCP: Manon Hilding, MD    Brief Narrative:  Wanda Shields is a 89 y.o. female with medical history significant for diabetes mellitus type 2, macular degeneration presented to the hospital with generalized weakness with falls.  Patient normally ambulates with the help of walker but had to use a wheelchair due to increasing weakness.  She has had a fall on Christmas Day since then she has had increasing weakness and had also hit her head.  In the ED, vitals were stable.  Labs showed hyponatremia with sodium of 126.  Urinary analysis showed some UTI with positive nitrates leukocytes, chest x-ray was negative for any acute findings.  Fosfomycin was given in the ED due to UTI concerning for penicillin allergy.  Patient was then considered for admission to hospital due to worsening debility and weakness.    During hospitalization, patient was seen by physical therapy and has been recommended skilled nursing facility placement.  Currently medically stable awaiting for skilled nursing facility  Assessment and plan.  Generalized weakness/ falls.   Likely secondary to increasing debility, advanced age, UTI.   COVID influenza and RSV negative.At baseline uses walker with fall risk.   Physical therapy has seen the patient at this time and recommend skilled nursing facility.    Left knee, hip pain.  X-ray of the left hip and knee done.  No fracture noted.  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  Symptomatic treatment.  Apply warm compression.  Has mild superficial bruise over the knee area.  No hemarthrosis.   E. coli UTI (urinary tract infection)  Received 1 dose of fosfomycin in the ED.   No leukocytosis, fever or urinary symptoms.  Constipation.  Continue bowel regimen.  Hyponatremia Has history of chronic hyponatremia.  Latest sodium level at 134.  Hypokalemia.  Improved after replacement.  Latest potassium of 3.7.  HTN  (hypertension) Continue lisinopril with holding parameters.  DM (diabetes mellitus) ([place]) Continue sliding scale insulin,diabetic diet.  Hold metformin.  Hemoglobin A1c of 7.8.  Latest POC glucose of 207.   DVT prophylaxis: enoxaparin (LOVENOX) injection 40 mg Start: 12/12/22 1000   Code Status:     Code Status: Full Code  Disposition: Skilled nursing facility placement.  Medically stable for disposition.  Status is: Observation  The patient will require care spanning > 2 midnights and should be moved to inpatient because: Generalized weakness, ambulatory dysfunction, need for rehabilitation   Family Communication: .   Spoke with the patient's daughter Ms. Manuela Schwartz on the phone on 12/13/2022.  Tried to reach her again but was not able to reach today.  Consultants:  None  Procedures:  None  Antimicrobials:  Fosfomycin x 1 in the ED.  Subjective: Today, patient was seen and examined at bedside.  Complains of knee pain and discomfort.  Overall weakness   Objective: Vitals:   12/15/22 0635 12/15/22 1323 12/15/22 2053 12/16/22 0453  BP: 111/65 (!) 107/56 (!) 113/57 (!) 144/79  Pulse: 74 83 67 80  Resp: 18 (!) '22 19 18  '$ Temp: 97.8 F (36.6 C) 97.9 F (36.6 C) (!) 97.4 F (36.3 C) 98 F (36.7 C)  TempSrc:  Oral Oral   SpO2: 97% 97% 96% 98%  Weight:      Height:        Intake/Output Summary (Last 24 hours) at 12/16/2022 1054 Last data filed at 12/15/2022 1730 Gross per 24 hour  Intake 360 ml  Output --  Net 360 ml    Filed Weights   12/11/22 1208 12/11/22 2300  Weight: 62.1 kg 60.2 kg    Physical Examination: Body mass index is 20.18 kg/m.   General: elderly female, debilitated and weak, frail appearing,  HENT:   No scleral pallor or icterus noted. Oral mucosa is moist.  Chest:  Clear breath sounds.  Diminished breath sounds bilaterally. No crackles or wheezes.  CVS: S1 &S2 heard. No murmur.  Regular rate and rhythm. Abdomen: Soft, nontender, nondistended.   Bowel sounds are heard.   Extremities: Left knee with superficial bruising.  Able to move the knee joint with some tenderness.  Left knee with bruising.  Nonspecific tenderness noted.  Left hip without any bruise. Psych: Alert, awake and oriented, normal mood CNS:  No cranial nerve deficits.  Generalized weakness noted. Skin: Warm and dry.  Superficial skin bruise over the left lower extremity.   Data Reviewed:   CBC: Recent Labs  Lab 12/11/22 1237 12/12/22 1227  WBC 8.6 8.2  HGB 11.1* 11.3*  HCT 34.1* 35.2*  MCV 93.9 95.4  PLT 178 151     Basic Metabolic Panel: Recent Labs  Lab 12/11/22 1237 12/12/22 0316 12/13/22 0527 12/14/22 0526  NA 126* 136 138 134*  K 4.0 3.8 3.4* 3.7  CL 97* 107 110 105  CO2 '22 24 22 23  '$ GLUCOSE 270* 139* 159* 207*  BUN '14 10 8 10  '$ CREATININE 0.82 0.72 0.69 0.68  CALCIUM 8.7* 8.4* 8.4* 8.4*  MG  --   --  1.7  --      Liver Function Tests: No results for input(s): "AST", "ALT", "ALKPHOS", "BILITOT", "PROT", "ALBUMIN" in the last 168 hours.   Radiology Studies: DG HIP UNILAT WITH PELVIS 1V LEFT  Result Date: 12/15/2022 CLINICAL DATA:  Fall with LEFT hip pain. EXAM: DG HIP (WITH OR WITHOUT PELVIS) 3 V *L* COMPARISON:  07/12/2021 FINDINGS: No acute fracture or dislocation identified. Surgical hardware within the proximal LEFT femur and LOWER lumbar spine again noted. No suspicious focal bony lesions are identified. IMPRESSION: No evidence of acute bony abnormality. Electronically Signed   By: Margarette Canada M.D.   On: 12/15/2022 13:21   DG Knee 1-2 Views Left  Result Date: 12/15/2022 CLINICAL DATA:  Multiple falls recently. Left knee pain, swelling, and bruising. EXAM: LEFT KNEE - 1-2 VIEW COMPARISON:  None Available. FINDINGS: There is diffuse decreased bone mineralization. Severe lateral compartment joint space narrowing and peripheral osteophytosis. Mild medial and lateral compartment chondrocalcinosis. Moderate superior and inferior patellar  degenerative osteophytes. Small joint effusion. Mild chronic calcific density overlying the distal quadriceps tendon. No acute fracture is seen. No dislocation. There is moderate soft tissue swelling of the anterior superior knee at the level of the distal quadriceps tendon. Moderate vascular calcifications. IMPRESSION: 1. Moderate soft tissue swelling of the anterior superior knee at the level of the distal quadriceps tendon. No acute fracture is seen. 2. 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 Electronically Signed   By: Yvonne Kendall M.D.   On: 12/15/2022 13:19      LOS: 0 days    Flora Lipps, MD [facility] Available via Epic secure chat 7am-7pm After these hours, please refer to coverage provider listed on [email or web address] 12/16/2022, 10:54 AM

## 2022-12-16 NOTE — TOC Progression Note (Signed)
Transition of Care [facility]) - Progression Note    Patient Details  Name: Wanda Shields MRN: [id number] Date of Birth: 1930-02-24  Transition of Care [facility]) CM/SW [place], [place] Phone Number: 12/16/2022, 10:01 AM  Clinical Narrative:    CSW spoke with pt to go over bed offers for SNF. Pt accepts bed offer at [facility]. CSW updated HUB. CSW to update insurance auth of facility choice. TOC to follow.  Expected Discharge Plan: [place] Barriers to Discharge: Continued Medical Work up  Expected Discharge Plan and Services       Living arrangements for the past 2 months: Single Family Home                                       Social Determinants of Health (SDOH) Interventions [place]: Food Insecurity Present (12/12/2022)  Housing: Low Risk  (12/12/2022)  Transportation Needs: No Transportation Needs (12/12/2022)  Utilities: Not At Risk (12/12/2022)  Tobacco Use: Low Risk  (12/11/2022)    Readmission Risk Interventions     No data to display

## 2022-12-16 NOTE — Progress Notes (Signed)
Ng Discharge Note  Admit Date:  12/11/2022 Discharge date: 12/16/2022   Eliot Ford to be D/C'd Rehab per MD order.  AVS completed. Patient/caregiver able to verbalize understanding.  Discharge Medication: Allergies as of 12/16/2022       Reactions   Atropine    Demerol [meperidine Hcl]    Penicillins         Medication List     TAKE these medications    acetaminophen 500 MG tablet Commonly known as: TYLENOL Take 1,000 mg by mouth every 6 (six) hours as needed for moderate pain.   alendronate 70 MG tablet Commonly known as: FOSAMAX Take 70 mg by mouth once a week. Sunday   Aspirin 81 MG Caps Take 1 capsule every day by oral route.   GLUCOTROL PO Take 5 mg by mouth in the morning.   HM Lidocaine Patch 4 % Generic drug: lidocaine Place 1 patch onto the skin daily.   lisinopril 10 MG tablet Commonly known as: ZESTRIL Take 0.5 tablets (5 mg total) by mouth daily. Take 5 mg daily, hold for blood pressure less than 100. What changed: additional instructions   meloxicam 15 MG tablet Commonly known as: MOBIC Take 15 mg by mouth daily.   metFORMIN 500 MG tablet Commonly known as: GLUCOPHAGE Take 500-1,000 mg by mouth See admin instructions. Take 500 mg Monday,Tuesday,Wednesday,Friday and Saturday and then 1000 mg on Sunday and Thurday.   nabumetone 500 MG tablet Commonly known as: RELAFEN Take 500 mg by mouth 2 (two) times daily.   ondansetron 8 MG tablet Commonly known as: ZOFRAN Take 8 mg by mouth 3 (three) times daily as needed.   polyethylene glycol 17 g packet Commonly known as: MIRALAX / GLYCOLAX Take 17 g by mouth daily as needed for mild constipation or moderate constipation.   PRESERVISION AREDS PO Take 1 tablet by mouth 2 (two) times daily.   vitamin B-12 500 MCG tablet Commonly known as: CYANOCOBALAMIN Take 500 mcg by mouth daily.   Vitamin D3 10 MCG (400 UNIT) tablet   Voltaren Arthritis Pain 1 % Gel Generic drug: diclofenac Sodium Apply  2 g topically 4 (four) times daily.        Discharge Assessment: Vitals:   12/16/22 0453 12/16/22 1252  BP: (!) 144/79 98/61  Pulse: 80 87  Resp: 18   Temp: 98 F (36.7 C) 99 F (37.2 C)  SpO2: 98% 98%   Skin clean, dry and intact without evidence of skin break down, no evidence of skin tears noted. IV catheter discontinued intact. Site without signs and symptoms of complications - no redness or edema noted at insertion site, patient denies c/o pain - only slight tenderness at site.  Dressing with slight pressure applied.  D/c Instructions-Education: Discharge instructions given to patient/family with verbalized understanding. D/c education completed with patient/family including follow up instructions, medication list, d/c activities limitations if indicated, with other d/c instructions as indicated by MD - patient able to verbalize understanding, all questions fully answered. Patient instructed to return to ED, call 911, or call MD for any changes in condition.  Patient escorted via bed, and D/C to [facility] via Staff.  Richrd Prime, LPN 6/0/7371 0:62 PM

## 2022-12-17 ENCOUNTER — Non-Acute Institutional Stay (SKILLED_NURSING_FACILITY): Payer: Medicare Other | Admitting: Adult Health

## 2022-12-17 ENCOUNTER — Encounter: Payer: Self-pay | Admitting: Adult Health

## 2022-12-17 DIAGNOSIS — E1159 Type 2 diabetes mellitus with other circulatory complications: Secondary | ICD-10-CM

## 2022-12-17 DIAGNOSIS — E1122 Type 2 diabetes mellitus with diabetic chronic kidney disease: Secondary | ICD-10-CM | POA: Insufficient documentation

## 2022-12-17 DIAGNOSIS — E1165 Type 2 diabetes mellitus with hyperglycemia: Secondary | ICD-10-CM | POA: Insufficient documentation

## 2022-12-17 DIAGNOSIS — Z8673 Personal history of transient ischemic attack (TIA), and cerebral infarction without residual deficits: Secondary | ICD-10-CM | POA: Insufficient documentation

## 2022-12-17 DIAGNOSIS — N3000 Acute cystitis without hematuria: Secondary | ICD-10-CM

## 2022-12-17 DIAGNOSIS — I152 Hypertension secondary to endocrine disorders: Secondary | ICD-10-CM | POA: Insufficient documentation

## 2022-12-17 DIAGNOSIS — M170 Bilateral primary osteoarthritis of knee: Secondary | ICD-10-CM | POA: Diagnosis not present

## 2022-12-17 DIAGNOSIS — M810 Age-related osteoporosis without current pathological fracture: Secondary | ICD-10-CM | POA: Insufficient documentation

## 2022-12-17 DIAGNOSIS — R531 Weakness: Secondary | ICD-10-CM

## 2022-12-17 NOTE — Progress Notes (Signed)
Location:  [place] Room Number: S151P Place of Service:  SNF (31)   CODE STATUS: Full Code  Allergies  Allergen Reactions   Atropine    Demerol [Meperidine Hcl]    Penicillins     Chief Complaint  Patient presents with   Hospitalization [facility] Follow up    HPI:  She is a 89 year old woman who has been hospitalized from 12-11-22 through 12-16-22. Her medical history includes: diabetes; macular degeneration; osteoarthritis; cva. She presented to the ED with increased weakness and falls. She normally uses a walker; but was using wheelchair due to her increased weakness. She did receive fosfomycin in the ED for uti  Generalized weaknesses and falls: likely secondary to increasing debility, advanced age and uti. She does have some left knee pain; x-rays performed with no fracture. She is here for short term rehab with her goal to return back home. She will continue be followed for her chronic illnesses including: Type 2 diabetes mellitus with hyperglycemia without long term current use of insulin: Hypertension associated with diabetes: Post menopausal osteoporosis:  Bilateral primary osteoarthritis of knees:    Past Medical History:  Diagnosis Date   Arthritis    Carpal tunnel syndrome    Diabetes mellitus without complication ([place])    Macular degeneration     Past Surgical History:  Procedure Laterality Date   ABDOMINAL HYSTERECTOMY     APPENDECTOMY     BACK SURGERY     CHOLECYSTECTOMY     HIP SURGERY     TONSILLECTOMY      Social History   Socioeconomic History   Marital status: Widowed    Spouse name: Not on file   Number of children: Not on file   Years of education: Not on file   Highest education level: Not on file  Occupational History   Not on file  Tobacco Use   Smoking status: Never   Smokeless tobacco: Never  Substance and Sexual Activity   Alcohol use: Never   Drug use: Never   Sexual activity: Not on file   Other Topics Concern   Not on file  Social History Narrative   Not on file   Social Determinants of Health   Financial Resource Strain: Not on file  Food Insecurity: Food Insecurity Present (12/12/2022)   Hunger Vital Sign    Worried About Running Out of Food in the Last Year: Never true    Ran Out of Food in the Last Year: Sometimes true  Transportation Needs: No Transportation Needs (12/12/2022)   PRAPARE - Hydrologist (Medical): No    Lack of Transportation (Non-Medical): No  Physical Activity: Not on file  Stress: Not on file  Social Connections: Not on file  Intimate Partner Violence: Not At Risk (12/12/2022)   Humiliation, Afraid, Rape, and Kick questionnaire    Fear of Current or Ex-Partner: No    Emotionally Abused: No    Physically Abused: No    Sexually Abused: No   History reviewed. No pertinent family history.    VITAL SIGNS BP 114/70   Pulse 88   Temp 98 F (36.7 C)   Resp 20   Ht '5\' 8"'$  (1.727 m)   Wt 130 lb 3.2 oz (59.1 kg)   SpO2 99%   BMI 19.80 kg/m   Outpatient Encounter Medications as of 12/17/2022  Medication Sig   acetaminophen (TYLENOL) 500 MG tablet Take 1,000 mg by  mouth every 8 (eight) hours as needed for moderate pain.   alendronate (FOSAMAX) 70 MG tablet Take 70 mg by mouth once a week. Sunday   Aspirin 81 MG CAPS Take 1 capsule every day by oral route.   Cholecalciferol (VITAMIN D3) 10 MCG (400 UNIT) tablet Take 400 Units by mouth daily.   diclofenac Sodium (VOLTAREN ARTHRITIS PAIN) 1 % GEL Apply 2 g topically 4 (four) times daily.   lidocaine (HM LIDOCAINE PATCH) 4 % Place 1 patch onto the skin every 12 (twelve) hours.   lisinopril (ZESTRIL) 10 MG tablet Take 0.5 tablets (5 mg total) by mouth daily. Take 5 mg daily, hold for blood pressure less than 100.   meloxicam (MOBIC) 15 MG tablet Take 15 mg by mouth daily.   metFORMIN (GLUCOPHAGE) 500 MG tablet Take 500 mg by mouth daily with breakfast.   Multiple  Vitamins-Minerals (PRESERVISION AREDS PO) Take 1 tablet by mouth 2 (two) times daily.   ondansetron (ZOFRAN) 8 MG tablet Take 8 mg by mouth 3 (three) times daily as needed.   polyethylene glycol (MIRALAX / GLYCOLAX) 17 g packet Take 17 g by mouth daily as needed for mild constipation or moderate constipation.   vitamin B-12 (CYANOCOBALAMIN) 500 MCG tablet Take 500 mcg by mouth daily.   No facility-administered encounter medications on file as of 12/17/2022.     SIGNIFICANT DIAGNOSTIC EXAMS  LABS REVIEWED:   12-11-22: wbc 8.6; hgb 11.1; hct 34.1; mcv 93.9 plt 178; glucose 270; bun 14; creat 0.82; k+ 4.0; na++ 126; ca 8.7; gfr >60; hgb A1c 7.8; urine culture: e-coli  Review of Systems  Constitutional:  Negative for malaise/fatigue.  Respiratory:  Negative for cough and shortness of breath.   Cardiovascular:  Negative for chest pain, palpitations and leg swelling.  Gastrointestinal:  Negative for abdominal pain, constipation and heartburn.  Musculoskeletal:  Negative for back pain, joint pain and myalgias.  Skin: Negative.   Neurological:  Negative for dizziness.  Psychiatric/Behavioral:  The patient is not nervous/anxious.     Physical Exam Constitutional:      General: She is not in acute distress.    Appearance: She is well-developed. She is not diaphoretic.  Neck:     Thyroid: No thyromegaly.  Cardiovascular:     Rate and Rhythm: Normal rate and regular rhythm.     Pulses: Normal pulses.     Heart sounds: Normal heart sounds.  Pulmonary:     Effort: Pulmonary effort is normal. No respiratory distress.     Breath sounds: Normal breath sounds.  Abdominal:     General: Bowel sounds are normal. There is no distension.     Palpations: Abdomen is soft.     Tenderness: There is no abdominal tenderness.  Musculoskeletal:        General: Normal range of motion.     Cervical back: Neck supple.     Right lower leg: No edema.     Left lower leg: No edema.  Lymphadenopathy:      Cervical: No cervical adenopathy.  Skin:    General: Skin is warm and dry.  Neurological:     Mental Status: She is alert and oriented to person, place, and time.  Psychiatric:        Mood and Affect: Mood normal.      ASSESSMENT/ PLAN:  TODAY  Type 2 diabetes mellitus with hyperglycemia without long term current use of insulin: hgb A1c 7.8; will continue metformin 500 mg daily will stop glipizide due  to side effect profile and risk of falls.   2. Hypertension associated with diabetes: b/p 114/70 will continue lisinopril 5 mg daily   3. Post menopausal osteoporosis: will continue fosamax 70 mg weekly   4. Bilateral primary osteoarthritis of knees: will continue mobic 15 mg daily; voltaren gel 2 gm four times daily; lidoderm patch. Relafen stopped due to side effect profile  5. History of cva: will continue asa 81 mg daily   6. Acute cystitis: has completed abt  7. Generalized weakness: will continue therapy as directed to improve upon her independence with her adls.   Ok Edwards NP [facility]  call [phone]

## 2022-12-18 ENCOUNTER — Encounter: Payer: Self-pay | Admitting: Adult Health

## 2022-12-18 ENCOUNTER — Non-Acute Institutional Stay (SKILLED_NURSING_FACILITY): Payer: Medicare Other | Admitting: Internal Medicine

## 2022-12-18 ENCOUNTER — Encounter: Payer: Self-pay | Admitting: Internal Medicine

## 2022-12-18 DIAGNOSIS — E871 Hypo-osmolality and hyponatremia: Secondary | ICD-10-CM

## 2022-12-18 DIAGNOSIS — R627 Adult failure to thrive: Secondary | ICD-10-CM | POA: Diagnosis not present

## 2022-12-18 DIAGNOSIS — E1165 Type 2 diabetes mellitus with hyperglycemia: Secondary | ICD-10-CM | POA: Diagnosis not present

## 2022-12-18 NOTE — Patient Instructions (Signed)
See assessment and plan under each diagnosis in the problem list and acutely for this visit 

## 2022-12-18 NOTE — Progress Notes (Signed)
Location:  [place] Room Number: 151-P Place of Service:  SNF (31)   CODE STATUS: DNR  Allergies  Allergen Reactions   Atropine    Demerol [Meperidine Hcl]    Penicillins     Chief Complaint  Patient presents with   Acute Visit    Medication review per family request    HPI:    Past Medical History:  Diagnosis Date   Arthritis    Carpal tunnel syndrome    Diabetes mellitus without complication ([place])    Macular degeneration     Past Surgical History:  Procedure Laterality Date   ABDOMINAL HYSTERECTOMY     APPENDECTOMY     BACK SURGERY     CHOLECYSTECTOMY     HIP SURGERY     TONSILLECTOMY      Social History   Socioeconomic History   Marital status: Widowed    Spouse name: Not on file   Number of children: Not on file   Years of education: Not on file   Highest education level: Not on file  Occupational History   Not on file  Tobacco Use   Smoking status: Never   Smokeless tobacco: Never  Substance and Sexual Activity   Alcohol use: Never   Drug use: Never   Sexual activity: Not on file  Other Topics Concern   Not on file  Social History Narrative   Not on file   Social Determinants of Health   Financial Resource Strain: Not on file  Food Insecurity: Food Insecurity Present (12/12/2022)   Hunger Vital Sign    Worried About Running Out of Food in the Last Year: Never true    Ran Out of Food in the Last Year: Sometimes true  Transportation Needs: No Transportation Needs (12/12/2022)   PRAPARE - Hydrologist (Medical): No    Lack of Transportation (Non-Medical): No  Physical Activity: Not on file  Stress: Not on file  Social Connections: Not on file  Intimate Partner Violence: Not At Risk (12/12/2022)   Humiliation, Afraid, Rape, and Kick questionnaire    Fear of Current or Ex-Partner: No    Emotionally Abused: No    Physically Abused: No    Sexually Abused: No   History reviewed. No  pertinent family history.    VITAL SIGNS BP 126/73   Pulse 72   Temp 98.4 F (36.9 C)   Resp (!) 22   Ht '5\' 8"'$  (1.727 m)   Wt 131 lb 9.6 oz (59.7 kg)   SpO2 98%   BMI 20.01 kg/m   Outpatient Encounter Medications as of 12/18/2022  Medication Sig   acetaminophen (TYLENOL) 500 MG tablet Take 1,000 mg by mouth every 8 (eight) hours as needed for moderate pain.   alendronate (FOSAMAX) 70 MG tablet Take 70 mg by mouth once a week. Sunday   Aspirin 81 MG CAPS Take 1 capsule every day by oral route.   Balsam [place]-Castor Oil (VENELEX) OINT Apply to bilateral buttocks, coccyx and sacrum Every Shift for prevention and blanchable erythema.   Cholecalciferol (VITAMIN D3) 10 MCG (400 UNIT) tablet Take 400 Units by mouth daily.   diclofenac Sodium (VOLTAREN ARTHRITIS PAIN) 1 % GEL Apply 2 g topically 4 (four) times daily.   lidocaine (HM LIDOCAINE PATCH) 4 % Place 1 patch onto the skin every 12 (twelve) hours.   lisinopril (ZESTRIL) 5 MG tablet Take 5 mg by mouth daily. Hold for systolic BP less than  100.   metFORMIN (GLUCOPHAGE) 500 MG tablet Take 500 mg by mouth daily with breakfast.   Multiple Vitamins-Minerals (PRESERVISION AREDS PO) Take 1 tablet by mouth 2 (two) times daily.   ondansetron (ZOFRAN) 8 MG tablet Take 8 mg by mouth 3 (three) times daily as needed.   polyethylene glycol (MIRALAX / GLYCOLAX) 17 g packet Take 17 g by mouth daily as needed for mild constipation or moderate constipation.   vitamin B-12 (CYANOCOBALAMIN) 500 MCG tablet Take 500 mcg by mouth daily.   [DISCONTINUED] lisinopril (ZESTRIL) 10 MG tablet Take 0.5 tablets (5 mg total) by mouth daily. Take 5 mg daily, hold for blood pressure less than 100.   [DISCONTINUED] meloxicam (MOBIC) 15 MG tablet Take 15 mg by mouth daily.   No facility-administered encounter medications on file as of 12/18/2022.     SIGNIFICANT DIAGNOSTIC EXAMS       ASSESSMENT/ PLAN:     Ok Edwards NP [facility]  Contact  [phone] Monday through Friday 8am- 5pm  After hours call [phone]

## 2022-12-18 NOTE — Assessment & Plan Note (Signed)
While hospitalized sodium ranged from the low 126 up to final value of 134.  By history hyponatremia is chronic.  Med list reviewed; no meds noted association with hyponatremia.  Continue to monitor.  Consider SIADH workup if hyponatremia recurs and progresses.

## 2022-12-18 NOTE — Assessment & Plan Note (Addendum)
PT/OT at SNF as tolerated. Nutritionist to assess at SNF.  No current total protein or albumin found in Epic.

## 2022-12-18 NOTE — Progress Notes (Signed)
NURSING HOME LOCATION:  [facility] ROOM NUMBER:  151 P  CODE STATUS:  Full Code  PCP: Consuello Masse MD  This is a comprehensive admission note to this SNFperformed on this date less than 30 days from date of admission. Included are preadmission medical/surgical history; reconciled medication list; family history; social history and comprehensive review of systems.  Corrections and additions to the records were documented. Comprehensive physical exam was also performed. Additionally a clinical summary was entered for each active diagnosis pertinent to this admission in the Problem List to enhance continuity of care.  HPI: She was hospitalized 12/11/2022 - 12/16/2022 presenting with generalized weakness and recurrent falls.  The patient normally is ambulatory with a walker but had to employ a wheelchair due to increasing weakness.  She fell on Christmas Day and struck her head. From that time weakness seemingly progressed clinically.In the ED sodium was 126.  Urinalysis revealed positive nitrates and leukocytes; fosfomycin was given in the ED pending results of urine culture.  The urine culture did reveal E. coli.  The patient has diabetes and current A1c is 7.8%.  While hospitalized glucoses ranged from a low of 113 up to 361; both of these were outliers.  The average of the glucoses were in the low 200+ range.  With rehydration sodium rose from 126 up to 134.  Creatinine ranged from 0.68 up to 0.82.  GFR was consistently greater than 60. She had struck her left knee in a fall and was complaining of left knee and hip pain. Imaging revealed no acute fractures. PT/OT consulted and recommended SNF placement for rehab.  Past medical and surgical history: Diabetes with neurovascular complications, chronic hyponatremia, essential hypertension, carpal tunnel syndrome, macular degeneration, and history of stroke. Surgeries and procedures include abdominal hysterectomy, THA, and  cholecystectomy.  Social history: Non-smoker, nondrinker.  Family history: Noncontributory due to advanced age.   Review of systems: Initially she stated that she been hospitalized because of weakness and "I need some PT."  It was only with some hinting that she did validate having had a UTI.  She states that actually she has had recurrent UTIs.  According to her son they can be as frequent as monthly.  She describes some occasional constipation.  She has numbness and tingling in the thumbs and first and second fingers of her hands, greater on the right than the left.  She states that this is related to carpal tunnel syndrome.  She does validate recurrent falls; she denies any cardiac or neurologic prodrome prior to the falls.  She does state that "my legs just folded up."  She describes easy bruising but no other bleeding dyscrasias.  Constitutional: No fever, significant weight change  Eyes: No redness, discharge, pain, vision change ENT/mouth: No nasal congestion, purulent discharge, earache, change in hearing, sore throat  Cardiovascular: No chest pain, palpitations, paroxysmal nocturnal dyspnea, claudication, edema  Respiratory: No cough, sputum production, hemoptysis, DOE, significant snoring, apnea Gastrointestinal: No heartburn, dysphagia, abdominal pain, nausea /vomiting, rectal bleeding, melena, change in bowels Genitourinary: No dysuria, hematuria, pyuria, incontinence @ present Dermatologic: No rash, pruritus, change in appearance of skin Neurologic: No dizziness, headache, syncope, seizures Psychiatric: No significant anxiety, depression, insomnia, anorexia Endocrine: No change in hair/skin/nails, excessive thirst, excessive hunger, excessive urination  Hematologic/lymphatic: No significant lymphadenopathy, abnormal bleeding Allergy/immunology: No itchy/watery eyes, significant sneezing, urticaria, angioedema  Physical exam:  Pertinent or positive findings: She appears younger than  her stated age.  Eyebrows are decreased laterally.  She is missing some maxillary teeth.  There is a low-grade raspy murmur at the right base.  Varus deformities of the knees are present.  Pedal pulses are palpable.  She has scattered bruising greater on the left hand than the right.  She also has bruising at the right anterior ankle.  The right knee is dressed.  There is an effusion on the left greater than the right.  There is atrophy of the calves.  Tinel's sign is equivocal for carpal tunnel syndrome.  She has interosseous wasting of the hands.  General appearance: no acute distress, increased work of breathing is present.   Lymphatic: No lymphadenopathy about the head, neck, axilla. Eyes: No conjunctival inflammation or lid edema is present. There is no scleral icterus. Ears:  External ear exam shows no significant lesions or deformities.   Nose:  External nasal examination shows no deformity or inflammation. Nasal mucosa are pink and moist without lesions, exudates Oral exam: Lips and gums are healthy appearing.There is no oropharyngeal erythema or exudate. Neck:  No thyromegaly, masses, tenderness noted.    Heart:  Normal rate and regular rhythm. S1 and S2 normal without gallop, click, rub.  Lungs: Chest clear to auscultation without wheezes, rhonchi, rales, rubs. Abdomen: Bowel sounds are normal.  Abdomen is soft and nontender with no organomegaly, hernias, masses. GU: Deferred  Extremities:  No cyanosis, clubbing, edema. Neurologic exam: Balance, Rhomberg, finger to nose testing could not be completed due to clinical state Skin: Warm & dry w/o tenting. No significant  rash.  See clinical summary under each active problem in the Problem List with associated updated therapeutic plan

## 2022-12-18 NOTE — Assessment & Plan Note (Addendum)
Current A1c is 7.8% which is adequate control based on her age and comorbidities and frequent falls.  Glucoses while hospitalized averaged roughly 220 with a range from 113 up to 361.  It is critical to avoid hypoglycemia because of the history of recurrent falls.For that reason Glipizide D/Ced @ admission to SNF.

## 2022-12-19 NOTE — Progress Notes (Signed)
This encounter was created in error - please disregard.

## 2022-12-22 ENCOUNTER — Other Ambulatory Visit (HOSPITAL_COMMUNITY)
Admission: RE | Admit: 2022-12-22 | Discharge: 2022-12-22 | Disposition: A | Discharge status: Home / Self Care | Payer: Medicare Other | Source: Skilled Nursing Facility | Attending: Adult Health | Admitting: Adult Health

## 2022-12-22 DIAGNOSIS — E1159 Type 2 diabetes mellitus with other circulatory complications: Secondary | ICD-10-CM | POA: Insufficient documentation

## 2022-12-22 LAB — CBC
HCT: 31 % — ABNORMAL LOW (ref 36.0–46.0)
Hemoglobin: 10.3 g/dL — ABNORMAL LOW (ref 12.0–15.0)
MCH: 30.2 pg (ref 26.0–34.0)
MCHC: 33.2 g/dL (ref 30.0–36.0)
MCV: 90.9 fL (ref 80.0–100.0)
Platelets: 362 10*3/uL (ref 150–400)
RBC: 3.41 MIL/uL — ABNORMAL LOW (ref 3.87–5.11)
RDW: 13.6 % (ref 11.5–15.5)
WBC: 7.3 10*3/uL (ref 4.0–10.5)
nRBC: 0 % (ref 0.0–0.2)

## 2022-12-22 LAB — BASIC METABOLIC PANEL
Anion gap: 9 (ref 5–15)
BUN: 19 mg/dL (ref 8–23)
CO2: 22 mmol/L (ref 22–32)
Calcium: 8.9 mg/dL (ref 8.9–10.3)
Chloride: 95 mmol/L — ABNORMAL LOW (ref 98–111)
Creatinine, Ser: 0.91 mg/dL (ref 0.44–1.00)
GFR, Estimated: 60 mL/min — ABNORMAL LOW (ref >60)
Glucose, Bld: 281 mg/dL — ABNORMAL HIGH (ref 70–99)
Potassium: 4.5 mmol/L (ref 3.5–5.1)
Sodium: 126 mmol/L — ABNORMAL LOW (ref 135–145)

## 2022-12-25 ENCOUNTER — Encounter: Payer: Self-pay | Admitting: Adult Health

## 2022-12-25 ENCOUNTER — Non-Acute Institutional Stay (SKILLED_NURSING_FACILITY): Payer: Medicare Other | Admitting: Adult Health

## 2022-12-25 DIAGNOSIS — E1165 Type 2 diabetes mellitus with hyperglycemia: Secondary | ICD-10-CM

## 2022-12-25 NOTE — Progress Notes (Signed)
Location:  [place] Room Number: 151-P Place of Service:  SNF (31)   CODE STATUS: DNR  Allergies  Allergen Reactions   Atropine    Demerol [Meperidine Hcl]    Penicillins     Chief Complaint  Patient presents with   Acute Visit    Elevated Cbg    HPI:  Her cbg readings are elevated from the upper 200's to 300's. She is presently taking metformin 500 mg daily. There are no reports of excessive hunger or thirst. Her hgb A1c is 7.8.   Past Medical History:  Diagnosis Date   Arthritis    Carpal tunnel syndrome    Diabetes mellitus without complication ([place])    Macular degeneration     Past Surgical History:  Procedure Laterality Date   ABDOMINAL HYSTERECTOMY     APPENDECTOMY     BACK SURGERY     CHOLECYSTECTOMY     HIP SURGERY     TONSILLECTOMY      Social History   Socioeconomic History   Marital status: Widowed    Spouse name: Not on file   Number of children: Not on file   Years of education: Not on file   Highest education level: Not on file  Occupational History   Not on file  Tobacco Use   Smoking status: Never   Smokeless tobacco: Never  Substance and Sexual Activity   Alcohol use: Never   Drug use: Never   Sexual activity: Not on file  Other Topics Concern   Not on file  Social History Narrative   Not on file   Social Determinants of Health   Financial Resource Strain: Not on file  Food Insecurity: Food Insecurity Present (12/12/2022)   Hunger Vital Sign    Worried About Running Out of Food in the Last Year: Never true    Ran Out of Food in the Last Year: Sometimes true  Transportation Needs: No Transportation Needs (12/12/2022)   PRAPARE - Hydrologist (Medical): No    Lack of Transportation (Non-Medical): No  Physical Activity: Not on file  Stress: Not on file  Social Connections: Not on file  Intimate Partner Violence: Not At Risk (12/12/2022)   Humiliation, Afraid, Rape, and  Kick questionnaire    Fear of Current or Ex-Partner: No    Emotionally Abused: No    Physically Abused: No    Sexually Abused: No   History reviewed. No pertinent family history.    VITAL SIGNS BP (!) 140/79   Pulse 62   Temp (!) 97.3 F (36.3 C)   Resp 20   Ht '5\' 6"'$  (1.676 m)   Wt 132 lb 6.4 oz (60.1 kg)   SpO2 96%   BMI 21.37 kg/m   Outpatient Encounter Medications as of 12/25/2022  Medication Sig   acetaminophen (ACETAMINOPHEN 8 HOUR) 650 MG CR tablet Take 650 mg by mouth every 6 (six) hours. DX: Polyosteoarthritis, unspecified   alendronate (FOSAMAX) 70 MG tablet Take 70 mg by mouth once a week. Sunday   Aspirin 81 MG CAPS Take 1 capsule every day by oral route.   Balsam Peru-Castor Oil (VENELEX) OINT Apply to bilateral buttocks, coccyx and sacrum Every Shift for prevention and blanchable erythema.   celecoxib (CELEBREX) 200 MG capsule Take 200 mg by mouth daily.   Cholecalciferol (VITAMIN D3) 10 MCG (400 UNIT) tablet Take 400 Units by mouth daily.   diclofenac Sodium (VOLTAREN ARTHRITIS PAIN) 1 % GEL  Apply 2 g topically 4 (four) times daily.   lidocaine (HM LIDOCAINE PATCH) 4 % Place 1 patch onto the skin every 12 (twelve) hours.   lisinopril (ZESTRIL) 5 MG tablet Take 5 mg by mouth daily. Hold for systolic BP less than 932.   metFORMIN (GLUCOPHAGE) 500 MG tablet Take 500 mg by mouth daily with breakfast.   miconazole (MICRO GUARD) 2 % powder Apply topically. apply powder under (R) breast fungal rash q shift x 14 days Every Shift   Multiple Vitamins-Minerals (PRESERVISION AREDS PO) Take 1 tablet by mouth 2 (two) times daily.   ondansetron (ZOFRAN) 8 MG tablet Take 8 mg by mouth 3 (three) times daily as needed.   polyethylene glycol (MIRALAX / GLYCOLAX) 17 g packet Take 17 g by mouth daily as needed for mild constipation or moderate constipation.   SODIUM CHLORIDE PO Take 1,000 mg by mouth daily. DX: Hypo-osmolality and hyponatremia   vitamin B-12 (CYANOCOBALAMIN) 500 MCG  tablet Take 500 mcg by mouth daily.   [DISCONTINUED] acetaminophen (TYLENOL) 500 MG tablet Take 1,000 mg by mouth every 8 (eight) hours as needed for moderate pain.   No facility-administered encounter medications on file as of 12/25/2022.     SIGNIFICANT DIAGNOSTIC EXAMS  LABS REVIEWED: PREVIOUS   12-11-22: wbc 8.6; hgb 11.1; hct 34.1; mcv 93.9 plt 178; glucose 270; bun 14; creat 0.82; k+ 4.0; na++ 126; ca 8.7; gfr >60; hgb A1c 7.8; urine culture: e-coli  NO NEW LABS.   Review of Systems  Constitutional:  Negative for malaise/fatigue.  Respiratory:  Negative for cough and shortness of breath.   Cardiovascular:  Negative for chest pain, palpitations and leg swelling.  Gastrointestinal:  Negative for abdominal pain, constipation and heartburn.  Musculoskeletal:  Negative for back pain, joint pain and myalgias.  Skin: Negative.   Neurological:  Negative for dizziness.  Psychiatric/Behavioral:  The patient is not nervous/anxious.    Physical Exam Constitutional:      General: She is not in acute distress.    Appearance: She is well-developed. She is not diaphoretic.  Neck:     Thyroid: No thyromegaly.  Cardiovascular:     Rate and Rhythm: Normal rate and regular rhythm.     Pulses: Normal pulses.     Heart sounds: Normal heart sounds.  Pulmonary:     Effort: Pulmonary effort is normal. No respiratory distress.     Breath sounds: Normal breath sounds.  Abdominal:     General: Bowel sounds are normal. There is no distension.     Palpations: Abdomen is soft.     Tenderness: There is no abdominal tenderness.  Musculoskeletal:        General: Normal range of motion.     Cervical back: Neck supple.     Right lower leg: No edema.     Left lower leg: No edema.  Lymphadenopathy:     Cervical: No cervical adenopathy.  Skin:    General: Skin is warm and dry.  Neurological:     Mental Status: She is alert. Mental status is at baseline.  Psychiatric:        Mood and Affect: Mood  normal.       ASSESSMENT/ PLAN:  TODAY  Type 2 diabetes mellitus with hyperglycemia without long term use of insulin: hgb A1c 7.8; will begin januvia 25 mg daily will monitor her status.    Ok Edwards NP [facility]   call [phone]

## 2022-12-26 ENCOUNTER — Other Ambulatory Visit: Payer: Self-pay | Admitting: *Deleted

## 2022-12-26 NOTE — Patient Outreach (Signed)
Per [facility] Mrs. Achey resides in [facility]. Following for potential [facility] care coordination services as benefit of insurance plan and Primary Care Provider.   Previous update from [facility] social worker indicated Mrs. Lasky is from home alone. Has supportive family.   Will continue to follow for transition plans and potential [facility] care coordination needs.   Marthenia Rolling, MSN, RN,BSN [place] Acute Care Coordinator [phone] (Direct dial)

## 2022-12-29 ENCOUNTER — Other Ambulatory Visit (HOSPITAL_COMMUNITY)
Admission: RE | Admit: 2022-12-29 | Discharge: 2022-12-29 | Disposition: A | Discharge status: Home / Self Care | Payer: Medicare Other | Source: Skilled Nursing Facility | Attending: Adult Health | Admitting: Adult Health

## 2022-12-29 DIAGNOSIS — R531 Weakness: Secondary | ICD-10-CM | POA: Insufficient documentation

## 2022-12-29 LAB — BASIC METABOLIC PANEL
Anion gap: 9 (ref 5–15)
BUN: 19 mg/dL (ref 8–23)
CO2: 22 mmol/L (ref 22–32)
Calcium: 8.4 mg/dL — ABNORMAL LOW (ref 8.9–10.3)
Chloride: 93 mmol/L — ABNORMAL LOW (ref 98–111)
Creatinine, Ser: 0.99 mg/dL (ref 0.44–1.00)
GFR, Estimated: 54 mL/min — ABNORMAL LOW (ref >60)
Glucose, Bld: 318 mg/dL — ABNORMAL HIGH (ref 70–99)
Potassium: 4.6 mmol/L (ref 3.5–5.1)
Sodium: 124 mmol/L — ABNORMAL LOW (ref 135–145)

## 2022-12-30 ENCOUNTER — Encounter: Payer: Self-pay | Admitting: Adult Health

## 2022-12-30 ENCOUNTER — Non-Acute Institutional Stay (SKILLED_NURSING_FACILITY): Payer: Medicare Other | Admitting: Adult Health

## 2022-12-30 DIAGNOSIS — N1831 Chronic kidney disease, stage 3a: Secondary | ICD-10-CM | POA: Diagnosis not present

## 2022-12-30 DIAGNOSIS — E1122 Type 2 diabetes mellitus with diabetic chronic kidney disease: Secondary | ICD-10-CM | POA: Diagnosis not present

## 2022-12-30 DIAGNOSIS — E871 Hypo-osmolality and hyponatremia: Secondary | ICD-10-CM

## 2022-12-30 DIAGNOSIS — Z66 Do not resuscitate: Secondary | ICD-10-CM

## 2022-12-30 DIAGNOSIS — Z794 Long term (current) use of insulin: Secondary | ICD-10-CM | POA: Diagnosis not present

## 2022-12-30 NOTE — Progress Notes (Signed)
Location:  [place] Room Number: 151 P Place of Service:  SNF (31)   CODE STATUS: DNR  Allergies  Allergen Reactions   Atropine    Demerol [Meperidine Hcl]    Penicillins     Chief Complaint  Patient presents with   Follow-up    Discuss recent labs     HPI:  Her sodium levels are low once again at 124. Her cbg readings are elevated 200-300's. There are no reports of pain; no worsening confusion.   Past Medical History:  Diagnosis Date   Arthritis    Carpal tunnel syndrome    Diabetes mellitus without complication ([place])    Macular degeneration     Past Surgical History:  Procedure Laterality Date   ABDOMINAL HYSTERECTOMY     APPENDECTOMY     BACK SURGERY     CHOLECYSTECTOMY     HIP SURGERY     TONSILLECTOMY      Social History   Socioeconomic History   Marital status: Widowed    Spouse name: Not on file   Number of children: Not on file   Years of education: Not on file   Highest education level: Not on file  Occupational History   Not on file  Tobacco Use   Smoking status: Never   Smokeless tobacco: Never  Vaping Use   Vaping Use: Never used  Substance and Sexual Activity   Alcohol use: Never   Drug use: Never   Sexual activity: Not on file  Other Topics Concern   Not on file  Social History Narrative   Not on file   Social Determinants of Health   Financial Resource Strain: Not on file  Food Insecurity: Food Insecurity Present (12/12/2022)   Hunger Vital Sign    Worried About Running Out of Food in the Last Year: Never true    Ran Out of Food in the Last Year: Sometimes true  Transportation Needs: No Transportation Needs (12/12/2022)   PRAPARE - Hydrologist (Medical): No    Lack of Transportation (Non-Medical): No  Physical Activity: Not on file  Stress: Not on file  Social Connections: Not on file  Intimate Partner Violence: Not At Risk (12/12/2022)   Humiliation, Afraid, Rape,  and Kick questionnaire    Fear of Current or Ex-Partner: No    Emotionally Abused: No    Physically Abused: No    Sexually Abused: No   History reviewed. No pertinent family history.    VITAL SIGNS BP (!) 154/73 Comment: Collected by clinical staff at [facility]  Pulse 65   Ht '5\' 6"'$  (1.676 m)   Wt 132 lb (59.9 kg)   BMI 21.31 kg/m   Outpatient Encounter Medications as of 12/30/2022  Medication Sig   acetaminophen (ACETAMINOPHEN 8 HOUR) 650 MG CR tablet Take 650 mg by mouth every 6 (six) hours. DX: Polyosteoarthritis, unspecified   alendronate (FOSAMAX) 70 MG tablet Take 70 mg by mouth once a week. Sunday   Aspirin 81 MG CAPS Take 1 capsule every day by oral route.   Balsam Peru-Castor Oil (VENELEX) OINT Apply to bilateral buttocks, coccyx and sacrum Every Shift for prevention and blanchable erythema.   Calcium Carb-Cholecalciferol (CALCIUM 600+D3 PO) Take 1 tablet by mouth 2 (two) times daily.   celecoxib (CELEBREX) 200 MG capsule Take 200 mg by mouth daily.   Cholecalciferol (VITAMIN D3) 10 MCG (400 UNIT) tablet Take 400 Units by mouth daily.  diclofenac Sodium (VOLTAREN ARTHRITIS PAIN) 1 % GEL Apply 2 g topically 4 (four) times daily.   lidocaine (HM LIDOCAINE PATCH) 4 % Place 1 patch onto the skin every 12 (twelve) hours.   lisinopril (ZESTRIL) 5 MG tablet Take 5 mg by mouth daily. Hold for systolic BP less than 202.   metFORMIN (GLUCOPHAGE) 500 MG tablet Take 500 mg by mouth daily with breakfast.   miconazole (MICRO GUARD) 2 % powder Apply topically. apply powder under (R) breast fungal rash q shift x 14 days Every Shift   Multiple Vitamins-Minerals (PRESERVISION AREDS PO) Take 1 tablet by mouth 2 (two) times daily.   ondansetron (ZOFRAN) 8 MG tablet Take 8 mg by mouth 3 (three) times daily as needed.   polyethylene glycol (MIRALAX / GLYCOLAX) 17 g packet Take 17 g by mouth daily as needed for mild constipation or moderate constipation.   sitaGLIPtin (JANUVIA) 25 MG  tablet Take 25 mg by mouth daily.   SODIUM CHLORIDE PO Take 1,000 mg by mouth daily. DX: Hypo-osmolality and hyponatremia   UNABLE TO FIND Diet: _____ Regular,  ______ NAS, ___x____Consistent Carbohydrate,  _______NPO _____Other   vitamin B-12 (CYANOCOBALAMIN) 500 MCG tablet Take 500 mcg by mouth daily.   No facility-administered encounter medications on file as of 12/30/2022.     SIGNIFICANT DIAGNOSTIC EXAMS  LABS REVIEWED: PREVIOUS   12-11-22: wbc 8.6; hgb 11.1; hct 34.1; mcv 93.9 plt 178; glucose 270; bun 14; creat 0.82; k+ 4.0; na++ 126; ca 8.7; gfr >60; hgb A1c 7.8; urine culture: e-coli  TODAY  12-29-22: glucose 318; bun 19; creat 0.99; k+ 4.6; na++ 124; ca 8.4; gfr 54    Review of Systems  Constitutional:  Negative for malaise/fatigue.  Respiratory:  Negative for cough and shortness of breath.   Cardiovascular:  Negative for chest pain, palpitations and leg swelling.  Gastrointestinal:  Negative for abdominal pain, constipation and heartburn.  Musculoskeletal:  Negative for back pain, joint pain and myalgias.  Skin: Negative.   Neurological:  Negative for dizziness.  Psychiatric/Behavioral:  The patient is not nervous/anxious.    Physical Exam Constitutional:      General: She is not in acute distress.    Appearance: She is well-developed. She is not diaphoretic.  Neck:     Thyroid: No thyromegaly.  Cardiovascular:     Rate and Rhythm: Normal rate and regular rhythm.     Pulses: Normal pulses.     Heart sounds: Normal heart sounds.  Pulmonary:     Effort: Pulmonary effort is normal. No respiratory distress.     Breath sounds: Normal breath sounds.  Abdominal:     General: Bowel sounds are normal. There is no distension.     Palpations: Abdomen is soft.     Tenderness: There is no abdominal tenderness.  Musculoskeletal:        General: Normal range of motion.     Cervical back: Neck supple.     Right lower leg: No edema.     Left lower leg: No edema.   Lymphadenopathy:     Cervical: No cervical adenopathy.  Skin:    General: Skin is warm and dry.  Neurological:     Mental Status: She is alert. Mental status is at baseline.  Psychiatric:        Mood and Affect: Mood normal.       ASSESSMENT/ PLAN:  TODAY  Type 2 diabetes mellitus with stage 3a chronic kidney disease with long term current use of insulin.  Her cbg readings remain elevated will add lantus 10 units nightly to her regimen  2. Hyponatremia: will increase NACL to 1 gm twice daily will monitor    Ok Edwards NP [facility]   call [phone]

## 2022-12-31 ENCOUNTER — Other Ambulatory Visit: Payer: Self-pay | Admitting: *Deleted

## 2022-12-31 NOTE — Patient Outreach (Signed)
Late entry for 12/30/22. Mrs. Geier resides in [place] skilled nursing facility. Screening for potential [facility] care coordination services as benefit of insurance plan and Primary Care Provider.   Collaboration with [facility] social worker [place]. Mrs. Salonga likely to return home next week with family support.   Will continue to follow.    Marthenia Rolling, MSN, RN,BSN [place] Acute Care Coordinator [phone] (Direct dial)

## 2023-01-01 ENCOUNTER — Encounter: Payer: Self-pay | Admitting: Adult Health

## 2023-01-01 ENCOUNTER — Other Ambulatory Visit (HOSPITAL_COMMUNITY)
Admission: RE | Admit: 2023-01-01 | Discharge: 2023-01-01 | Disposition: A | Discharge status: Home / Self Care | Payer: Medicare Other | Source: Skilled Nursing Facility | Attending: Adult Health | Admitting: Adult Health

## 2023-01-01 ENCOUNTER — Non-Acute Institutional Stay (SKILLED_NURSING_FACILITY): Payer: Medicare Other | Admitting: Adult Health

## 2023-01-01 ENCOUNTER — Other Ambulatory Visit: Payer: Self-pay | Admitting: Adult Health

## 2023-01-01 DIAGNOSIS — I152 Hypertension secondary to endocrine disorders: Secondary | ICD-10-CM | POA: Diagnosis not present

## 2023-01-01 DIAGNOSIS — E1122 Type 2 diabetes mellitus with diabetic chronic kidney disease: Secondary | ICD-10-CM

## 2023-01-01 DIAGNOSIS — E1159 Type 2 diabetes mellitus with other circulatory complications: Secondary | ICD-10-CM | POA: Diagnosis not present

## 2023-01-01 DIAGNOSIS — N1831 Chronic kidney disease, stage 3a: Secondary | ICD-10-CM

## 2023-01-01 DIAGNOSIS — Z794 Long term (current) use of insulin: Secondary | ICD-10-CM | POA: Diagnosis not present

## 2023-01-01 DIAGNOSIS — R531 Weakness: Secondary | ICD-10-CM

## 2023-01-01 DIAGNOSIS — J121 Respiratory syncytial virus pneumonia: Secondary | ICD-10-CM

## 2023-01-01 LAB — BASIC METABOLIC PANEL
Anion gap: 8 (ref 5–15)
BUN: 21 mg/dL (ref 8–23)
CO2: 24 mmol/L (ref 22–32)
Calcium: 10.3 mg/dL (ref 8.9–10.3)
Chloride: 94 mmol/L — ABNORMAL LOW (ref 98–111)
Creatinine, Ser: 0.94 mg/dL (ref 0.44–1.00)
GFR, Estimated: 58 mL/min — ABNORMAL LOW (ref >60)
Glucose, Bld: 128 mg/dL — ABNORMAL HIGH (ref 70–99)
Potassium: 4.9 mmol/L (ref 3.5–5.1)
Sodium: 126 mmol/L — ABNORMAL LOW (ref 135–145)

## 2023-01-01 MED ORDER — ALENDRONATE SODIUM 70 MG PO TABS: 70.0000 mg | ORAL_TABLET | ORAL | 0 refills | Status: DC

## 2023-01-01 MED ORDER — SODIUM CHLORIDE 1 G PO TABS: 1.0000 g | ORAL_TABLET | Freq: Two times a day (BID) | ORAL | 0 refills | Status: DC

## 2023-01-01 MED ORDER — CELECOXIB 200 MG PO CAPS: 200.0000 mg | ORAL_CAPSULE | Freq: Every day | ORAL | 0 refills | Status: AC

## 2023-01-01 MED ORDER — LANTUS SOLOSTAR 100 UNIT/ML ~~LOC~~ SOPN: 10.0000 [IU] | PEN_INJECTOR | Freq: Every day | SUBCUTANEOUS | 0 refills | Status: DC

## 2023-01-01 MED ORDER — SITAGLIPTIN PHOSPHATE 25 MG PO TABS: 25.0000 mg | ORAL_TABLET | Freq: Every day | ORAL | 0 refills | Status: DC

## 2023-01-01 MED ORDER — METFORMIN HCL 500 MG PO TABS: 500.0000 mg | ORAL_TABLET | Freq: Every day | ORAL | 0 refills | Status: DC

## 2023-01-01 MED ORDER — ONDANSETRON HCL 8 MG PO TABS: 8.0000 mg | ORAL_TABLET | Freq: Three times a day (TID) | ORAL | 0 refills | Status: DC | PRN

## 2023-01-01 MED ORDER — DICLOFENAC SODIUM 1 % EX GEL: 2.0000 g | Freq: Four times a day (QID) | CUTANEOUS | 0 refills | Status: DC

## 2023-01-01 MED ORDER — LISINOPRIL 5 MG PO TABS: 5.0000 mg | ORAL_TABLET | Freq: Every day | ORAL | 0 refills | Status: DC

## 2023-01-01 NOTE — Progress Notes (Signed)
Location:  [facility] Room Number: 151-P Place of Service:  SNF (31)   CODE STATUS: DNR  Allergies  Allergen Reactions   Atropine    Demerol [Meperidine Hcl]    Penicillins     Chief Complaint  Patient presents with   Discharge Note    HPI:  She is being discharged to home with home health for pt/ot. She will not need any dme. She will need her prescriptions written and will need to follow up with her medical provider. She had been hospitalized for increased weakness and debility. She has participated in therapy to improve upon her level of independence for her adls. She is now wanting to return back to her home environment.   Past Medical History:  Diagnosis Date   Arthritis    Carpal tunnel syndrome    Diabetes mellitus without complication ([facility])    Macular degeneration     Past Surgical History:  Procedure Laterality Date   ABDOMINAL HYSTERECTOMY     APPENDECTOMY     BACK SURGERY     CHOLECYSTECTOMY     HIP SURGERY     TONSILLECTOMY      Social History   Socioeconomic History   Marital status: Widowed    Spouse name: Not on file   Number of children: Not on file   Years of education: Not on file   Highest education level: Not on file  Occupational History   Not on file  Tobacco Use   Smoking status: Never   Smokeless tobacco: Never  Vaping Use   Vaping Use: Never used  Substance and Sexual Activity   Alcohol use: Never   Drug use: Never   Sexual activity: Not on file  Other Topics Concern   Not on file  Social History Narrative   Not on file   Social Determinants of Health   Financial Resource Strain: Not on file  Food Insecurity: Food Insecurity Present (12/12/2022)   Hunger Vital Sign    Worried About Running Out of Food in the Last Year: Never true    Ran Out of Food in the Last Year: Sometimes true  Transportation Needs: No Transportation Needs (12/12/2022)   PRAPARE - Administrator, Civil Service  (Medical): No    Lack of Transportation (Non-Medical): No  Physical Activity: Not on file  Stress: Not on file  Social Connections: Not on file  Intimate Partner Violence: Not At Risk (12/12/2022)   Humiliation, Afraid, Rape, and Kick questionnaire    Fear of Current or Ex-Partner: No    Emotionally Abused: No    Physically Abused: No    Sexually Abused: No   History reviewed. No pertinent family history.    VITAL SIGNS BP 120/68   Pulse 65   Temp (!) 95.7 F (35.4 C)   Resp 20   Ht 5\' 6"  (1.676 m)   Wt 135 lb 12.8 oz (61.6 kg)   SpO2 98%   BMI 21.92 kg/m   Outpatient Encounter Medications as of 01/01/2023  Medication Sig   acetaminophen (ACETAMINOPHEN 8 HOUR) 650 MG CR tablet Take 650 mg by mouth every 6 (six) hours. DX: Polyosteoarthritis, unspecified   alendronate (FOSAMAX) 70 MG tablet Take 70 mg by mouth once a week. Sunday   Aspirin 81 MG CAPS Take 1 capsule every day by oral route.   Balsam [place]-Castor Oil (VENELEX) OINT Apply to bilateral buttocks, coccyx and sacrum Every Shift for prevention and blanchable erythema.  Calcium Carb-Cholecalciferol (CALCIUM 600+D3 PO) Take 1 tablet by mouth 2 (two) times daily.   celecoxib (CELEBREX) 200 MG capsule Take 200 mg by mouth daily.   Cholecalciferol (VITAMIN D3) 10 MCG (400 UNIT) tablet Take 400 Units by mouth daily.   diclofenac Sodium (VOLTAREN ARTHRITIS PAIN) 1 % GEL Apply 2 g topically 4 (four) times daily.   insulin glargine (LANTUS SOLOSTAR) 100 UNIT/ML Solostar Pen Inject 10 Units into the skin at bedtime.   lidocaine (HM LIDOCAINE PATCH) 4 % Place 1 patch onto the skin every 12 (twelve) hours.   lisinopril (ZESTRIL) 5 MG tablet Take 5 mg by mouth daily. Hold for systolic BP less than 100.   metFORMIN (GLUCOPHAGE) 500 MG tablet Take 500 mg by mouth daily with breakfast.   miconazole (MICRO GUARD) 2 % powder Apply topically. apply powder under (R) breast fungal rash q shift x 14 days Every Shift   Multiple  Vitamins-Minerals (PRESERVISION AREDS PO) Take 1 tablet by mouth 2 (two) times daily.   ondansetron (ZOFRAN) 8 MG tablet Take 8 mg by mouth 3 (three) times daily as needed.   polyethylene glycol (MIRALAX / GLYCOLAX) 17 g packet Take 17 g by mouth daily as needed for mild constipation or moderate constipation.   sitaGLIPtin (JANUVIA) 25 MG tablet Take 25 mg by mouth daily.   SODIUM CHLORIDE PO Take 1,000 mg by mouth daily. DX: Hypo-osmolality and hyponatremia   UNABLE TO FIND Diet: _____ Regular,  ______ NAS, ___x____Consistent Carbohydrate,  _______NPO _____Other   vitamin B-12 (CYANOCOBALAMIN) 500 MCG tablet Take 500 mcg by mouth daily.   No facility-administered encounter medications on file as of 01/01/2023.     SIGNIFICANT DIAGNOSTIC EXAMS   LABS REVIEWED: PREVIOUS   12-11-22: wbc 8.6; hgb 11.1; hct 34.1; mcv 93.9 plt 178; glucose 270; bun 14; creat 0.82; k+ 4.0; na++ 126; ca 8.7; gfr >60; hgb A1c 7.8; urine culture: e-coli 12-29-22: glucose 318; bun 19; creat 0.99; k+ 4.6; na++ 124; ca 8.4; gfr 54  NO NEW LABS   Review of Systems  Constitutional:  Negative for malaise/fatigue.  Respiratory:  Negative for cough and shortness of breath.   Cardiovascular:  Negative for chest pain, palpitations and leg swelling.  Gastrointestinal:  Negative for abdominal pain, constipation and heartburn.  Musculoskeletal:  Negative for back pain, joint pain and myalgias.  Skin: Negative.   Neurological:  Negative for dizziness.  Psychiatric/Behavioral:  The patient is not nervous/anxious.    Physical Exam Constitutional:      General: She is not in acute distress.    Appearance: She is well-developed. She is not diaphoretic.  Neck:     Thyroid: No thyromegaly.  Cardiovascular:     Rate and Rhythm: Normal rate and regular rhythm.     Pulses: Normal pulses.     Heart sounds: Normal heart sounds.  Pulmonary:     Effort: Pulmonary effort is normal. No respiratory distress.     Breath  sounds: Normal breath sounds.  Abdominal:     General: Bowel sounds are normal. There is no distension.     Palpations: Abdomen is soft.     Tenderness: There is no abdominal tenderness.  Musculoskeletal:        General: Normal range of motion.     Cervical back: Neck supple.     Right lower leg: No edema.     Left lower leg: No edema.  Lymphadenopathy:     Cervical: No cervical adenopathy.  Skin:  General: Skin is warm and dry.  Neurological:     Mental Status: She is alert. Mental status is at baseline.  Psychiatric:        Mood and Affect: Mood normal.       ASSESSMENT/ PLAN:   Patient is being discharged with the following home health services:  pt/pt to evaluate and treat as indicated for gait balance strength adl training.   Patient is being discharged with the following durable medical equipment:  none needed   Patient has been advised to f/u with their PCP in 1-2 weeks to for a transitions of care visit.  Social services at their facility was responsible for arranging this appointment.  Pt was provided with adequate prescriptions of noncontrolled medications to reach the scheduled appointment .  For controlled substances, a limited supply was provided as appropriate for the individual patient.  If the pt normally receives these medications from a pain clinic or has a contract with another physician, these medications should be received from that clinic or physician only).    A 30 day supply of her prescription medications have been sent to [facility].   Time spent with patient: 30 minutes: medications; home health; dme.    Synthia Innocent NP [facility]   call [phone]

## 2023-01-05 ENCOUNTER — Emergency Department (HOSPITAL_COMMUNITY): Payer: Medicare Other

## 2023-01-05 ENCOUNTER — Observation Stay (HOSPITAL_COMMUNITY)
Admission: EM | Admit: 2023-01-05 | Discharge: 2023-01-10 | Disposition: A | Discharge status: Skilled Nursing Facility | Payer: Medicare Other | Attending: Internal Medicine | Admitting: Internal Medicine

## 2023-01-05 ENCOUNTER — Other Ambulatory Visit: Payer: Self-pay

## 2023-01-05 ENCOUNTER — Encounter (HOSPITAL_COMMUNITY): Payer: Self-pay | Admitting: Emergency Medicine

## 2023-01-05 DIAGNOSIS — M810 Age-related osteoporosis without current pathological fracture: Secondary | ICD-10-CM | POA: Diagnosis not present

## 2023-01-05 DIAGNOSIS — L89303 Pressure ulcer of unspecified buttock, stage 3: Secondary | ICD-10-CM

## 2023-01-05 DIAGNOSIS — Z9181 History of falling: Secondary | ICD-10-CM | POA: Diagnosis not present

## 2023-01-05 DIAGNOSIS — Z794 Long term (current) use of insulin: Secondary | ICD-10-CM | POA: Diagnosis not present

## 2023-01-05 DIAGNOSIS — Z7982 Long term (current) use of aspirin: Secondary | ICD-10-CM | POA: Insufficient documentation

## 2023-01-05 DIAGNOSIS — R296 Repeated falls: Secondary | ICD-10-CM | POA: Diagnosis not present

## 2023-01-05 DIAGNOSIS — Z7984 Long term (current) use of oral hypoglycemic drugs: Secondary | ICD-10-CM | POA: Diagnosis not present

## 2023-01-05 DIAGNOSIS — R2681 Unsteadiness on feet: Secondary | ICD-10-CM | POA: Diagnosis not present

## 2023-01-05 DIAGNOSIS — Z1152 Encounter for screening for COVID-19: Secondary | ICD-10-CM | POA: Diagnosis not present

## 2023-01-05 DIAGNOSIS — R55 Syncope and collapse: Secondary | ICD-10-CM | POA: Diagnosis not present

## 2023-01-05 DIAGNOSIS — I152 Hypertension secondary to endocrine disorders: Secondary | ICD-10-CM | POA: Diagnosis not present

## 2023-01-05 DIAGNOSIS — R531 Weakness: Principal | ICD-10-CM | POA: Insufficient documentation

## 2023-01-05 DIAGNOSIS — M6281 Muscle weakness (generalized): Secondary | ICD-10-CM | POA: Insufficient documentation

## 2023-01-05 DIAGNOSIS — Z79899 Other long term (current) drug therapy: Secondary | ICD-10-CM | POA: Insufficient documentation

## 2023-01-05 DIAGNOSIS — E871 Hypo-osmolality and hyponatremia: Secondary | ICD-10-CM

## 2023-01-05 DIAGNOSIS — R2689 Other abnormalities of gait and mobility: Secondary | ICD-10-CM | POA: Insufficient documentation

## 2023-01-05 DIAGNOSIS — N189 Chronic kidney disease, unspecified: Secondary | ICD-10-CM | POA: Insufficient documentation

## 2023-01-05 DIAGNOSIS — B974 Respiratory syncytial virus as the cause of diseases classified elsewhere: Secondary | ICD-10-CM | POA: Insufficient documentation

## 2023-01-05 DIAGNOSIS — E1122 Type 2 diabetes mellitus with diabetic chronic kidney disease: Secondary | ICD-10-CM | POA: Insufficient documentation

## 2023-01-05 DIAGNOSIS — I700 Atherosclerosis of aorta: Secondary | ICD-10-CM | POA: Diagnosis not present

## 2023-01-05 DIAGNOSIS — B338 Other specified viral diseases: Secondary | ICD-10-CM

## 2023-01-05 DIAGNOSIS — E1165 Type 2 diabetes mellitus with hyperglycemia: Secondary | ICD-10-CM | POA: Diagnosis not present

## 2023-01-05 DIAGNOSIS — K5900 Constipation, unspecified: Secondary | ICD-10-CM | POA: Diagnosis not present

## 2023-01-05 DIAGNOSIS — J121 Respiratory syncytial virus pneumonia: Secondary | ICD-10-CM | POA: Insufficient documentation

## 2023-01-05 DIAGNOSIS — Z8673 Personal history of transient ischemic attack (TIA), and cerebral infarction without residual deficits: Secondary | ICD-10-CM | POA: Diagnosis not present

## 2023-01-05 DIAGNOSIS — L8990 Pressure ulcer of unspecified site, unspecified stage: Secondary | ICD-10-CM | POA: Insufficient documentation

## 2023-01-05 DIAGNOSIS — M158 Other polyosteoarthritis: Secondary | ICD-10-CM | POA: Diagnosis not present

## 2023-01-05 DIAGNOSIS — I129 Hypertensive chronic kidney disease with stage 1 through stage 4 chronic kidney disease, or unspecified chronic kidney disease: Secondary | ICD-10-CM | POA: Insufficient documentation

## 2023-01-05 DIAGNOSIS — R109 Unspecified abdominal pain: Secondary | ICD-10-CM | POA: Diagnosis not present

## 2023-01-05 DIAGNOSIS — N1831 Chronic kidney disease, stage 3a: Secondary | ICD-10-CM

## 2023-01-05 HISTORY — DX: Type 2 diabetes mellitus without complications: E11.9

## 2023-01-05 HISTORY — DX: Other specified viral diseases: B33.8

## 2023-01-05 HISTORY — DX: Hypo-osmolality and hyponatremia: E87.1

## 2023-01-05 HISTORY — DX: Urinary tract infection, site not specified: N39.0

## 2023-01-05 LAB — CBC WITH DIFFERENTIAL/PLATELET
Abs Immature Granulocytes: 0.04 10*3/uL (ref 0.00–0.07)
Basophils Absolute: 0 10*3/uL (ref 0.0–0.1)
Basophils Relative: 0 %
Eosinophils Absolute: 0.1 10*3/uL (ref 0.0–0.5)
Eosinophils Relative: 1 %
HCT: 28.5 % — ABNORMAL LOW (ref 36.0–46.0)
Hemoglobin: 9.4 g/dL — ABNORMAL LOW (ref 12.0–15.0)
Immature Granulocytes: 0 %
Lymphocytes Relative: 25 %
Lymphs Abs: 2.2 10*3/uL (ref 0.7–4.0)
MCH: 30.8 pg (ref 26.0–34.0)
MCHC: 33 g/dL (ref 30.0–36.0)
MCV: 93.4 fL (ref 80.0–100.0)
Monocytes Absolute: 0.8 10*3/uL (ref 0.1–1.0)
Monocytes Relative: 9 %
Neutro Abs: 5.8 10*3/uL (ref 1.7–7.7)
Neutrophils Relative %: 65 %
Platelets: 244 10*3/uL (ref 150–400)
RBC: 3.05 MIL/uL — ABNORMAL LOW (ref 3.87–5.11)
RDW: 14 % (ref 11.5–15.5)
WBC: 9 10*3/uL (ref 4.0–10.5)
nRBC: 0 % (ref 0.0–0.2)

## 2023-01-05 LAB — COMPREHENSIVE METABOLIC PANEL
ALT: 14 U/L (ref 0–44)
AST: 19 U/L (ref 15–41)
Albumin: 2.8 g/dL — ABNORMAL LOW (ref 3.5–5.0)
Alkaline Phosphatase: 37 U/L — ABNORMAL LOW (ref 38–126)
Anion gap: 11 (ref 5–15)
BUN: 24 mg/dL — ABNORMAL HIGH (ref 8–23)
CO2: 23 mmol/L (ref 22–32)
Calcium: 8.8 mg/dL — ABNORMAL LOW (ref 8.9–10.3)
Chloride: 89 mmol/L — ABNORMAL LOW (ref 98–111)
Creatinine, Ser: 0.97 mg/dL (ref 0.44–1.00)
GFR, Estimated: 56 mL/min — ABNORMAL LOW (ref >60)
Glucose, Bld: 179 mg/dL — ABNORMAL HIGH (ref 70–99)
Potassium: 4.9 mmol/L (ref 3.5–5.1)
Sodium: 123 mmol/L — ABNORMAL LOW (ref 135–145)
Total Bilirubin: 0.9 mg/dL (ref 0.3–1.2)
Total Protein: 5.4 g/dL — ABNORMAL LOW (ref 6.5–8.1)

## 2023-01-05 LAB — SODIUM, URINE, RANDOM: Sodium, Ur: 69 mmol/L

## 2023-01-05 LAB — URINALYSIS, ROUTINE W REFLEX MICROSCOPIC
Bilirubin Urine: NEGATIVE
Glucose, UA: NEGATIVE mg/dL
Hgb urine dipstick: NEGATIVE
Ketones, ur: NEGATIVE mg/dL
Leukocytes,Ua: NEGATIVE
Nitrite: NEGATIVE
Protein, ur: NEGATIVE mg/dL
Specific Gravity, Urine: 1.009 (ref 1.005–1.030)
pH: 7 (ref 5.0–8.0)

## 2023-01-05 LAB — I-STAT CHEM 8, ED
BUN: 21 mg/dL (ref 8–23)
Calcium, Ion: 1.15 mmol/L (ref 1.15–1.40)
Chloride: 90 mmol/L — ABNORMAL LOW (ref 98–111)
Creatinine, Ser: 1 mg/dL (ref 0.44–1.00)
Glucose, Bld: 170 mg/dL — ABNORMAL HIGH (ref 70–99)
HCT: 30 % — ABNORMAL LOW (ref 36.0–46.0)
Hemoglobin: 10.2 g/dL — ABNORMAL LOW (ref 12.0–15.0)
Potassium: 5.1 mmol/L (ref 3.5–5.1)
Sodium: 123 mmol/L — ABNORMAL LOW (ref 135–145)
TCO2: 24 mmol/L (ref 22–32)

## 2023-01-05 LAB — CK: Total CK: 112 U/L (ref 38–234)

## 2023-01-05 LAB — RESP PANEL BY RT-PCR (RSV, FLU A&B, COVID)  RVPGX2
Influenza A by PCR: NEGATIVE
Influenza B by PCR: NEGATIVE
Resp Syncytial Virus by PCR: POSITIVE — AB
SARS Coronavirus 2 by RT PCR: NEGATIVE

## 2023-01-05 LAB — BLOOD GAS, VENOUS
Acid-Base Excess: 6.9 mmol/L — ABNORMAL HIGH (ref 0.0–2.0)
Bicarbonate: 32.4 mmol/L — ABNORMAL HIGH (ref 20.0–28.0)
Drawn by: 66400
O2 Saturation: 46.2 %
Patient temperature: 36.7
pCO2, Ven: 49 mmHg (ref 44–60)
pH, Ven: 7.42 (ref 7.25–7.43)
pO2, Ven: <31 mmHg — CL (ref 32–45)

## 2023-01-05 LAB — MAGNESIUM: Magnesium: 1.5 mg/dL — ABNORMAL LOW (ref 1.7–2.4)

## 2023-01-05 MED ORDER — IOHEXOL 300 MG/ML  SOLN
80.0000 mL | Freq: Once | INTRAMUSCULAR | Status: AC | PRN
  Administered 2023-01-05: 80 mL via INTRAVENOUS

## 2023-01-05 MED ORDER — ONDANSETRON HCL 4 MG PO TABS: 4.0000 mg | ORAL_TABLET | Freq: Four times a day (QID) | ORAL | Status: DC | PRN

## 2023-01-05 MED ORDER — LACTATED RINGERS IV BOLUS
500.0000 mL | Freq: Once | INTRAVENOUS | Status: AC
  Administered 2023-01-05: 500 mL via INTRAVENOUS

## 2023-01-05 MED ORDER — MAGNESIUM SULFATE 2 GM/50ML IV SOLN
2.0000 g | Freq: Once | INTRAVENOUS | Status: AC
  Administered 2023-01-05: 2 g via INTRAVENOUS
  Filled 2023-01-05: qty 50

## 2023-01-05 MED ORDER — LISINOPRIL 5 MG PO TABS
5.0000 mg | ORAL_TABLET | Freq: Every day | ORAL | Status: DC
  Filled 2023-01-05: qty 1

## 2023-01-05 MED ORDER — SODIUM CHLORIDE 0.9 % IV BOLUS
500.0000 mL | Freq: Once | INTRAVENOUS | Status: AC
  Administered 2023-01-05: 500 mL via INTRAVENOUS

## 2023-01-05 MED ORDER — HEPARIN SODIUM (PORCINE) 5000 UNIT/ML IJ SOLN
5000.0000 [IU] | Freq: Three times a day (TID) | INTRAMUSCULAR | Status: DC
  Administered 2023-01-06 – 2023-01-10 (×13): 5000 [IU] via SUBCUTANEOUS
  Filled 2023-01-05 (×13): qty 1

## 2023-01-05 MED ORDER — ACETAMINOPHEN 325 MG PO TABS
650.0000 mg | ORAL_TABLET | Freq: Four times a day (QID) | ORAL | Status: DC | PRN
  Administered 2023-01-06 – 2023-01-10 (×6): 650 mg via ORAL
  Filled 2023-01-05 (×6): qty 2

## 2023-01-05 MED ORDER — OXYCODONE HCL 5 MG PO TABS: 5.0000 mg | ORAL_TABLET | ORAL | Status: DC | PRN

## 2023-01-05 MED ORDER — ACETAMINOPHEN 650 MG RE SUPP: 650.0000 mg | Freq: Four times a day (QID) | RECTAL | Status: DC | PRN

## 2023-01-05 MED ORDER — INSULIN ASPART 100 UNIT/ML IJ SOLN
0.0000 [IU] | Freq: Three times a day (TID) | INTRAMUSCULAR | Status: DC
  Administered 2023-01-06: 3 [IU] via SUBCUTANEOUS
  Administered 2023-01-06: 2 [IU] via SUBCUTANEOUS
  Administered 2023-01-06: 5 [IU] via SUBCUTANEOUS
  Administered 2023-01-07 (×3): 2 [IU] via SUBCUTANEOUS
  Administered 2023-01-08 (×2): 5 [IU] via SUBCUTANEOUS
  Administered 2023-01-09: 2 [IU] via SUBCUTANEOUS
  Administered 2023-01-09: 5 [IU] via SUBCUTANEOUS
  Administered 2023-01-10: 3 [IU] via SUBCUTANEOUS

## 2023-01-05 MED ORDER — SODIUM CHLORIDE 1 G PO TABS
1.0000 g | ORAL_TABLET | Freq: Three times a day (TID) | ORAL | Status: DC
  Administered 2023-01-06 – 2023-01-10 (×13): 1 g via ORAL
  Filled 2023-01-05 (×13): qty 1

## 2023-01-05 MED ORDER — SODIUM CHLORIDE 0.9 % IV SOLN: INTRAVENOUS | Status: DC

## 2023-01-05 MED ORDER — ASPIRIN 81 MG PO TBEC
81.0000 mg | DELAYED_RELEASE_TABLET | Freq: Every day | ORAL | Status: DC
  Administered 2023-01-06 – 2023-01-10 (×5): 81 mg via ORAL
  Filled 2023-01-05 (×6): qty 1

## 2023-01-05 MED ORDER — INSULIN ASPART 100 UNIT/ML IJ SOLN: 0.0000 [IU] | Freq: Every day | INTRAMUSCULAR | Status: DC

## 2023-01-05 MED ORDER — ONDANSETRON HCL 4 MG/2ML IJ SOLN: 4.0000 mg | Freq: Four times a day (QID) | INTRAMUSCULAR | Status: DC | PRN

## 2023-01-05 NOTE — Assessment & Plan Note (Signed)
-  Magnesium 1.5 - 2 g magnesium repleted in the ED - Trend with a.m. labs

## 2023-01-05 NOTE — ED Provider Notes (Signed)
[facility] Provider Note   CSN: [id number] Arrival date & time: 01/05/23  1334     History  No chief complaint on file.   Wanda Shields is a 89 y.o. female.  HPI Patient presents for generalized weakness.  Medical history includes T2DM, HTN, CVA, arthritis, macular degeneration.  She was hospitalized 3 weeks ago for generalized weakness secondary to hyponatremia, UTI.  She was discharged from nursing facility 2 days ago.  At time of discharge, she was initially able to walk but now requires two-person assist.  Patient endorses stiffness in her proximal bilateral lower extremities.  Her daughter, who, is at bedside, has noticed weakness in all extremities.    Home Medications Prior to Admission medications   Medication Sig Start Date End Date Taking? Authorizing Provider  acetaminophen (ACETAMINOPHEN 8 HOUR) 650 MG CR tablet Take 650 mg by mouth every 6 (six) hours. DX: Polyosteoarthritis, unspecified   Yes [provider]  acetaminophen (TYLENOL) 500 MG tablet Take 1,000 mg by mouth every 6 (six) hours as needed.   Yes [provider]  alendronate (FOSAMAX) 70 MG tablet Take 1 tablet (70 mg total) by mouth once a week. Sunday 01/01/23  Yes Gerlene Fee, NP  Aspirin 81 MG CAPS Take 1 capsule by mouth daily.   Yes [provider]  Calcium Carb-Cholecalciferol (CALCIUM 600+D3 PO) Take 1 tablet by mouth 2 (two) times daily.   Yes [provider]  celecoxib (CELEBREX) 200 MG capsule Take 1 capsule (200 mg total) by mouth daily. 01/01/23  Yes Gerlene Fee, NP  Cholecalciferol (VITAMIN D3) 10 MCG (400 UNIT) tablet Take 400 Units by mouth daily. 02/11/22  Yes [provider]  diclofenac Sodium (VOLTAREN ARTHRITIS PAIN) 1 % GEL Apply 2 g topically 4 (four) times daily. 01/01/23  Yes Gerlene Fee, NP  docusate sodium (COLACE) 100 MG capsule Take 100-300 mg by mouth every evening.   Yes [provider]  insulin glargine (LANTUS SOLOSTAR) 100 UNIT/ML Solostar Pen Inject 10 Units into the skin at bedtime. 01/01/23  Yes Gerlene Fee, NP  lidocaine (HM LIDOCAINE PATCH) 4 % Place 1 patch onto the skin every 12 (twelve) hours.   Yes [provider]  lisinopril (ZESTRIL) 5 MG tablet Take 1 tablet (5 mg total) by mouth daily. Hold for systolic BP less than 867. 01/01/23  Yes Gerlene Fee, NP  metFORMIN (GLUCOPHAGE) 500 MG tablet Take 1 tablet (500 mg total) by mouth daily with breakfast. 01/01/23  Yes Gerlene Fee, NP  miconazole (MICRO GUARD) 2 % powder Apply topically. apply powder under (R) breast fungal rash q shift x 14 days Every Shift   Yes [provider]  Multiple Vitamins-Minerals (PRESERVISION AREDS PO) Take 1 tablet by mouth 2 (two) times daily.   Yes [provider]  ondansetron (ZOFRAN) 8 MG tablet Take 1 tablet (8 mg total) by mouth 3 (three) times daily as needed. 01/01/23  Yes Gerlene Fee, NP  polyethylene glycol (MIRALAX / GLYCOLAX) 17 g packet Take 17 g by mouth daily as needed for mild constipation or moderate constipation. 12/16/22  Yes Pokhrel, Laxman, MD  sitaGLIPtin (JANUVIA) 25 MG tablet Take 1 tablet (25 mg total) by mouth daily. 01/01/23  Yes Gerlene Fee, NP  sodium chloride 1 g tablet Take 1 tablet (1 g total) by mouth 2 (two) times daily with a meal. DX: Hypo-osmolality and hyponatremia 01/01/23  Yes Green,  Phylis Bougie, NP  vitamin B-12 (CYANOCOBALAMIN) 500 MCG tablet Take 500 mcg by mouth daily.   Yes [provider]  glipiZIDE (GLUCOTROL XL) 5 MG 24 hr tablet Take 5 mg by mouth daily. Patient not taking: Reported on 01/05/2023 01/02/23   [provider]  metFORMIN (GLUCOPHAGE-XR) 500 MG 24 hr tablet Take 1,000 mg by mouth 2 (two) times daily. Patient not taking: Reported on 01/05/2023 10/18/22   [provider]  UNABLE TO FIND Diet: _____ Regular,  ______ NAS, ___x____Consistent Carbohydrate,   _______NPO _____Other    [provider]      Allergies    Atropine, Demerol [meperidine hcl], and Penicillins    Review of Systems   Review of Systems  Constitutional:  Positive for fatigue.  Musculoskeletal:  Positive for gait problem and myalgias.  Neurological:  Positive for weakness (Generalized).  All other systems reviewed and are negative.   Physical Exam Updated Vital Signs BP 126/75 (BP Location: Left Arm)   Pulse (!) 58   Temp 97.6 F (36.4 C) (Oral)   Resp 18   Ht '5\' 6"'$  (1.676 m)   Wt 63.7 kg   SpO2 96%   BMI 22.67 kg/m  Physical Exam Vitals and nursing note reviewed.  Constitutional:      General: She is not in acute distress.    Appearance: Normal appearance. She is well-developed. She is not ill-appearing, toxic-appearing or diaphoretic.  HENT:     Head: Normocephalic and atraumatic.     Right Ear: External ear normal.     Left Ear: External ear normal.     Nose: Nose normal.     Mouth/Throat:     Mouth: Mucous membranes are moist.  Eyes:     Extraocular Movements: Extraocular movements intact.     Conjunctiva/sclera: Conjunctivae normal.  Cardiovascular:     Rate and Rhythm: Normal rate and regular rhythm.     Heart sounds: No murmur heard. Pulmonary:     Effort: Pulmonary effort is normal. No respiratory distress.     Breath sounds: Normal breath sounds. No wheezing or rales.  Abdominal:     General: There is no distension.     Palpations: Abdomen is soft.     Tenderness: There is no abdominal tenderness.  Musculoskeletal:        General: No swelling.     Cervical back: Normal range of motion and neck supple.     Right lower leg: No edema.     Left lower leg: No edema.  Skin:    General: Skin is warm and dry.     Capillary Refill: Capillary refill takes less than 2 seconds.     Coloration: Skin is not jaundiced or pale.  Neurological:     General: No focal deficit present.     Mental Status: She is alert and oriented to person,  place, and time.     Cranial Nerves: No cranial nerve deficit.     Sensory: No sensory deficit.     Motor: No weakness.     Coordination: Coordination normal.  Psychiatric:        Mood and Affect: Mood normal.        Behavior: Behavior normal.        Thought Content: Thought content normal.        Judgment: Judgment normal.     ED Results / Procedures / Treatments   Labs (all labs ordered are listed, but only abnormal results are displayed) Labs Reviewed  RESP PANEL BY RT-PCR (RSV, FLU A&B, COVID)  RVPGX2 - Abnormal; Notable for the following components:      Result Value   Resp Syncytial Virus by PCR POSITIVE (*)    All other components within normal limits  COMPREHENSIVE METABOLIC PANEL - Abnormal; Notable for the following components:   Sodium 123 (*)    Chloride 89 (*)    Glucose, Bld 179 (*)    BUN 24 (*)    Calcium 8.8 (*)    Total Protein 5.4 (*)    Albumin 2.8 (*)    Alkaline Phosphatase 37 (*)    GFR, Estimated 56 (*)    All other components within normal limits  CBC WITH DIFFERENTIAL/PLATELET - Abnormal; Notable for the following components:   RBC 3.05 (*)    Hemoglobin 9.4 (*)    HCT 28.5 (*)    All other components within normal limits  URINALYSIS, ROUTINE W REFLEX MICROSCOPIC - Abnormal; Notable for the following components:   Color, Urine COLORLESS (*)    All other components within normal limits  BLOOD GAS, VENOUS - Abnormal; Notable for the following components:   pO2, Ven <31 (*)    Bicarbonate 32.4 (*)    Acid-Base Excess 6.9 (*)    All other components within normal limits  MAGNESIUM - Abnormal; Notable for the following components:   Magnesium 1.5 (*)    All other components within normal limits  I-STAT CHEM 8, ED - Abnormal; Notable for the following components:   Sodium 123 (*)    Chloride 90 (*)    Glucose, Bld 170 (*)    Hemoglobin 10.2 (*)    HCT 30.0 (*)    All other components within normal limits  URINE CULTURE  CK  SODIUM, URINE,  RANDOM  COMPREHENSIVE METABOLIC PANEL  MAGNESIUM  CBC WITH DIFFERENTIAL/PLATELET  TSH    EKG EKG Interpretation  Date/Time:  Monday January 05 2023 14:47:30 EST Ventricular Rate:  130 PR Interval:  194 QRS Duration: 76 QT Interval:  352 QTC Calculation: 518 R Axis:   -3 Text Interpretation: Sinus rhythm Low voltage QRS Premature atrial complexes Confirmed by Godfrey Pick [phone]) on 01/05/2023 3:33:20 PM  Radiology CT ABDOMEN PELVIS W CONTRAST  Result Date: 01/05/2023 CLINICAL DATA:  Abdominal pain. EXAM: CT ABDOMEN AND PELVIS WITH CONTRAST TECHNIQUE: Multidetector CT imaging of the abdomen and pelvis was performed using the standard protocol following bolus administration of intravenous contrast. RADIATION DOSE REDUCTION: This exam was performed according to the departmental dose-optimization program which includes automated exposure control, adjustment of the mA and/or kV according to patient size and/or use of iterative reconstruction technique. CONTRAST:  63m OMNIPAQUE IOHEXOL 300 MG/ML  SOLN COMPARISON:  CT abdomen pelvis dated 06/01/2022. FINDINGS: Lower chest: Bibasilar linear atelectasis/scarring. The visualized lung bases are otherwise clear. There is coronary vascular calcification. No intra-abdominal free air or free fluid. Hepatobiliary: The liver is unremarkable. Mild biliary dilatation, likely post cholecystectomy. No retained calcified stone noted in the central CBD. Pancreas: The pancreas is atrophic.  No active inflammatory changes. Spleen: Normal in size without focal abnormality. Adrenals/Urinary Tract: The adrenal glands are unremarkable. There is no hydronephrosis or nephrolithiasis on either side. Left renal upper pole 3 cm cyst. No imaging follow-up. There is extrarenal pelvis bilaterally with mild pelviectasis. The visualized ureters appear unremarkable. The urinary bladder is distended. Stomach/Bowel: There is sigmoid diverticulosis without active inflammatory changes.  There is large amount of stool throughout the colon. There is no bowel obstruction  or active inflammation. The appendix is not visualized with certainty. No inflammatory changes identified in the right lower quadrant. Vascular/Lymphatic: Moderate aortoiliac atherosclerotic disease. The IVC is unremarkable. No portal venous gas. There is no adenopathy. Reproductive: Hysterectomy. The right ovary is grossly of the. The left ovary is not visualized. Other: None Musculoskeletal: Osteopenia with degenerative changes of the spine. Several old healed left rib fractures. Lower lumbar fusion and fixation screws through the left femoral neck. Similar appearance of fragmentation of the left femoral head. No acute osseous pathology. IMPRESSION: 1. No acute intra-abdominal or pelvic pathology. 2. Sigmoid diverticulosis. 3. Constipation.  No bowel obstruction. 4.  Aortic Atherosclerosis (ICD10-I70.0). Electronically Signed   By: Anner Crete M.D.   On: 01/05/2023 21:40   DG Chest Port 1 View  Result Date: 01/05/2023 CLINICAL DATA:  weakness EXAM: PORTABLE CHEST - 1 VIEW COMPARISON:  12/11/2022 FINDINGS: Cardiac silhouette is unremarkable. No pneumothorax or pleural effusion. The lungs are clear. Aorta is calcified. Old healed posterior left third through sixth rib fractures noted. IMPRESSION: No acute cardiopulmonary process. Electronically Signed   By: Sammie Bench M.D.   On: 01/05/2023 16:30    Procedures Procedures    Medications Ordered in ED Medications  aspirin EC tablet 81 mg (has no administration in time range)  lisinopril (ZESTRIL) tablet 5 mg (has no administration in time range)  sodium chloride tablet 1 g (has no administration in time range)  insulin aspart (novoLOG) injection 0-15 Units (has no administration in time range)  insulin aspart (novoLOG) injection 0-5 Units ( Subcutaneous Not Given 01/06/23 0026)  heparin injection 5,000 Units (has no administration in time range)  0.9 %  sodium  chloride infusion ( Intravenous New Bag/Given 01/05/23 2346)  acetaminophen (TYLENOL) tablet 650 mg (has no administration in time range)    Or  acetaminophen (TYLENOL) suppository 650 mg (has no administration in time range)  oxyCODONE (Oxy IR/ROXICODONE) immediate release tablet 5 mg (has no administration in time range)  ondansetron (ZOFRAN) tablet 4 mg (has no administration in time range)    Or  ondansetron (ZOFRAN) injection 4 mg (has no administration in time range)  lactated ringers bolus 500 mL (0 mLs Intravenous Stopped 01/05/23 1656)  magnesium sulfate IVPB 2 g 50 mL (0 g Intravenous Stopped 01/05/23 1918)  sodium chloride 0.9 % bolus 500 mL (0 mLs Intravenous Stopped 01/05/23 2002)  iohexol (OMNIPAQUE) 300 MG/ML solution 80 mL (80 mLs Intravenous Contrast Given 01/05/23 2046)    ED Course/ Medical Decision Making/ A&P                             Medical Decision Making Amount and/or Complexity of Data Reviewed Labs: ordered. Radiology: ordered.  Risk Prescription drug management. Decision regarding hospitalization.   This patient presents to the ED for concern of weakness, this involves an extensive number of treatment options, and is a complaint that carries with it a high risk of complications and morbidity.  The differential diagnosis includes deconditioning, infection, metabolic derangements, anemia   Co morbidities that complicate the patient evaluation  T2DM, HTN, CVA, arthritis, macular degeneration   Additional history obtained:  Additional history obtained from patient's daughter External records from outside source obtained and reviewed including EMR   Lab Tests:  I Ordered, and personally interpreted labs.  The pertinent results include: Hyponatremia and hypomagnesemia are present.  Hemoglobin is slightly decreased from baseline.  Viral testing is positive for RSV.  Imaging Studies ordered:  I ordered imaging studies including chest x-ray, CT of  abdomen and pelvis I independently visualized and interpreted imaging which showed constipation without other acute findings I agree with the radiologist interpretation   Cardiac Monitoring: / EKG:  The patient was maintained on a cardiac monitor.  I personally viewed and interpreted the cardiac monitored which showed an underlying rhythm of: Sinus rhythm  Problem List / ED Course / Critical interventions / Medication management  Pain presents with generalized weakness.  Per daughter and patient, this seems to have progressed over the past 2 days.  Patient also endorses stiffness in her proximal bilateral lower extremities.  She denies any other current symptoms.  Broad diagnostic workup was initiated.  Patient's lab work is notable for hyponatremia and hypomagnesemia.  Replacement magnesium was ordered.  Normal saline was ordered.  Patient and daughter were informed of lab work results.  States that the patient has been taking salt tablets at home.  Because of hyponatremia recurrence is indeterminate.  Patient was found to be positive for RSV.  Given her ongoing generalized weakness and hyponatremia, patient was admitted to hospitalist for further management. I ordered medication including IV fluids for patient; magnesium sulfate for hypomagnesemia Reevaluation of the patient after these medicines showed that the patient stayed the same I have reviewed the patients home medicines and have made adjustments as needed   Social Determinants of Health:  Recent hospitalization and rehab facility stay         Final Clinical Impression(s) / ED Diagnoses Final diagnoses:  Generalized weakness  Hyponatremia  Hypomagnesemia  RSV infection    Rx / DC Orders ED Discharge Orders     None         Godfrey Pick, MD 01/06/23 0206

## 2023-01-05 NOTE — Assessment & Plan Note (Signed)
-  RSV positive - Chest x-ray shows no acute cardiopulmonary process - Supportive care

## 2023-01-05 NOTE — Assessment & Plan Note (Signed)
-  10 units basal insulin at baseline - Holding basal insulin while in the hospital - Sliding scale coverage - Carb modified diet - Monitor CBGs

## 2023-01-05 NOTE — Assessment & Plan Note (Addendum)
-  Generalized weakness at home requiring 2 person assist - Likely due to hyponatremia, dehydration and RSV - PT eval and treat - Supportive care for RSV - Encourage p.o. intake - Check TSH - Continue to monitor

## 2023-01-05 NOTE — Assessment & Plan Note (Addendum)
-  Sodium 123 - Was up to 126 at prior admission - Seems to be hypovolemic - Continue normal saline - Trend in the a.m. - Continue 1 g sodium tablets with meals - 500 mL bolus NS given in the ED - Likely contributing to generalized weakness

## 2023-01-05 NOTE — ED Triage Notes (Signed)
Pt was d/c from [facility] on Saturday. Family says pt had declined since her discharge. Patient was able to walkl last week and is now a 2 assist per the daughter. General complaint of weakness.

## 2023-01-06 DIAGNOSIS — Z7984 Long term (current) use of oral hypoglycemic drugs: Secondary | ICD-10-CM | POA: Diagnosis not present

## 2023-01-06 DIAGNOSIS — Z794 Long term (current) use of insulin: Secondary | ICD-10-CM | POA: Diagnosis not present

## 2023-01-06 DIAGNOSIS — K5900 Constipation, unspecified: Secondary | ICD-10-CM | POA: Diagnosis not present

## 2023-01-06 DIAGNOSIS — Z7982 Long term (current) use of aspirin: Secondary | ICD-10-CM | POA: Diagnosis not present

## 2023-01-06 DIAGNOSIS — J121 Respiratory syncytial virus pneumonia: Secondary | ICD-10-CM

## 2023-01-06 DIAGNOSIS — Z8673 Personal history of transient ischemic attack (TIA), and cerebral infarction without residual deficits: Secondary | ICD-10-CM | POA: Diagnosis not present

## 2023-01-06 DIAGNOSIS — E1122 Type 2 diabetes mellitus with diabetic chronic kidney disease: Secondary | ICD-10-CM | POA: Diagnosis not present

## 2023-01-06 DIAGNOSIS — N1831 Chronic kidney disease, stage 3a: Secondary | ICD-10-CM | POA: Diagnosis not present

## 2023-01-06 DIAGNOSIS — Z79899 Other long term (current) drug therapy: Secondary | ICD-10-CM | POA: Diagnosis not present

## 2023-01-06 DIAGNOSIS — R531 Weakness: Secondary | ICD-10-CM

## 2023-01-06 DIAGNOSIS — E871 Hypo-osmolality and hyponatremia: Secondary | ICD-10-CM | POA: Diagnosis not present

## 2023-01-06 DIAGNOSIS — R55 Syncope and collapse: Secondary | ICD-10-CM | POA: Diagnosis not present

## 2023-01-06 DIAGNOSIS — Z1152 Encounter for screening for COVID-19: Secondary | ICD-10-CM | POA: Diagnosis not present

## 2023-01-06 LAB — CBC WITH DIFFERENTIAL/PLATELET
Abs Immature Granulocytes: 0.02 10*3/uL (ref 0.00–0.07)
Basophils Absolute: 0 10*3/uL (ref 0.0–0.1)
Basophils Relative: 0 %
Eosinophils Absolute: 0.1 10*3/uL (ref 0.0–0.5)
Eosinophils Relative: 1 %
HCT: 29.3 % — ABNORMAL LOW (ref 36.0–46.0)
Hemoglobin: 9.7 g/dL — ABNORMAL LOW (ref 12.0–15.0)
Immature Granulocytes: 0 %
Lymphocytes Relative: 31 %
Lymphs Abs: 2.2 10*3/uL (ref 0.7–4.0)
MCH: 30.7 pg (ref 26.0–34.0)
MCHC: 33.1 g/dL (ref 30.0–36.0)
MCV: 92.7 fL (ref 80.0–100.0)
Monocytes Absolute: 0.8 10*3/uL (ref 0.1–1.0)
Monocytes Relative: 11 %
Neutro Abs: 4.2 10*3/uL (ref 1.7–7.7)
Neutrophils Relative %: 57 %
Platelets: 254 10*3/uL (ref 150–400)
RBC: 3.16 MIL/uL — ABNORMAL LOW (ref 3.87–5.11)
RDW: 13.9 % (ref 11.5–15.5)
WBC: 7.3 10*3/uL (ref 4.0–10.5)
nRBC: 0 % (ref 0.0–0.2)

## 2023-01-06 LAB — BASIC METABOLIC PANEL
Anion gap: 11 (ref 5–15)
Anion gap: 10 (ref 5–15)
BUN: 15 mg/dL (ref 8–23)
BUN: 20 mg/dL (ref 8–23)
CO2: 24 mmol/L (ref 22–32)
CO2: 23 mmol/L (ref 22–32)
Calcium: 8.6 mg/dL — ABNORMAL LOW (ref 8.9–10.3)
Calcium: 8 mg/dL — ABNORMAL LOW (ref 8.9–10.3)
Chloride: 94 mmol/L — ABNORMAL LOW (ref 98–111)
Chloride: 95 mmol/L — ABNORMAL LOW (ref 98–111)
Creatinine, Ser: 0.83 mg/dL (ref 0.44–1.00)
Creatinine, Ser: 0.92 mg/dL (ref 0.44–1.00)
GFR, Estimated: >60 mL/min (ref >60)
GFR, Estimated: 59 mL/min — ABNORMAL LOW (ref >60)
Glucose, Bld: 235 mg/dL — ABNORMAL HIGH (ref 70–99)
Glucose, Bld: 169 mg/dL — ABNORMAL HIGH (ref 70–99)
Potassium: 4.2 mmol/L (ref 3.5–5.1)
Potassium: 4.3 mmol/L (ref 3.5–5.1)
Sodium: 129 mmol/L — ABNORMAL LOW (ref 135–145)
Sodium: 128 mmol/L — ABNORMAL LOW (ref 135–145)

## 2023-01-06 LAB — GLUCOSE, CAPILLARY
Glucose-Capillary: 206 mg/dL — ABNORMAL HIGH (ref 70–99)
Glucose-Capillary: 131 mg/dL — ABNORMAL HIGH (ref 70–99)
Glucose-Capillary: 164 mg/dL — ABNORMAL HIGH (ref 70–99)

## 2023-01-06 LAB — COMPREHENSIVE METABOLIC PANEL
ALT: 13 U/L (ref 0–44)
AST: 16 U/L (ref 15–41)
Albumin: 2.6 g/dL — ABNORMAL LOW (ref 3.5–5.0)
Alkaline Phosphatase: 33 U/L — ABNORMAL LOW (ref 38–126)
Anion gap: 8 (ref 5–15)
BUN: 17 mg/dL (ref 8–23)
CO2: 25 mmol/L (ref 22–32)
Calcium: 8.7 mg/dL — ABNORMAL LOW (ref 8.9–10.3)
Chloride: 95 mmol/L — ABNORMAL LOW (ref 98–111)
Creatinine, Ser: 0.79 mg/dL (ref 0.44–1.00)
GFR, Estimated: >60 mL/min (ref >60)
Glucose, Bld: 165 mg/dL — ABNORMAL HIGH (ref 70–99)
Potassium: 4 mmol/L (ref 3.5–5.1)
Sodium: 128 mmol/L — ABNORMAL LOW (ref 135–145)
Total Bilirubin: 0.9 mg/dL (ref 0.3–1.2)
Total Protein: 5 g/dL — ABNORMAL LOW (ref 6.5–8.1)

## 2023-01-06 LAB — MAGNESIUM: Magnesium: 1.7 mg/dL (ref 1.7–2.4)

## 2023-01-06 LAB — TSH: TSH: 2.408 u[IU]/mL (ref 0.350–4.500)

## 2023-01-06 MED ORDER — POLYETHYLENE GLYCOL 3350 17 G PO PACK
17.0000 g | PACK | Freq: Every day | ORAL | Status: DC
  Administered 2023-01-06 – 2023-01-07 (×2): 17 g via ORAL
  Filled 2023-01-06 (×4): qty 1

## 2023-01-06 MED ORDER — BISACODYL 10 MG RE SUPP: 10.0000 mg | Freq: Every day | RECTAL | Status: DC | PRN

## 2023-01-06 MED ORDER — BISACODYL 10 MG RE SUPP
10.0000 mg | Freq: Once | RECTAL | Status: AC
  Administered 2023-01-06: 10 mg via RECTAL
  Filled 2023-01-06: qty 1

## 2023-01-06 MED ORDER — DICLOFENAC SODIUM 1 % EX GEL
2.0000 g | Freq: Four times a day (QID) | CUTANEOUS | Status: DC
  Administered 2023-01-06 – 2023-01-10 (×13): 2 g via TOPICAL
  Filled 2023-01-06 (×3): qty 100

## 2023-01-06 MED ORDER — LISINOPRIL 5 MG PO TABS
5.0000 mg | ORAL_TABLET | Freq: Every day | ORAL | Status: DC
  Administered 2023-01-06 – 2023-01-07 (×2): 5 mg via ORAL
  Filled 2023-01-06: qty 1

## 2023-01-06 MED ORDER — INSULIN GLARGINE-YFGN 100 UNIT/ML ~~LOC~~ SOLN
10.0000 [IU] | Freq: Every day | SUBCUTANEOUS | Status: DC
  Administered 2023-01-06 – 2023-01-09 (×4): 10 [IU] via SUBCUTANEOUS
  Filled 2023-01-06 (×5): qty 0.1

## 2023-01-06 NOTE — H&P (Signed)
History and Physical    Patient: Wanda Shields PNT:[id number] DOB: 07-Mar-1930 DOA: 01/05/2023 DOS: the patient was seen and examined on 01/06/2023 PCP: Manon Hilding, MD  Patient coming from: Home  Chief Complaint: Generalized weakness   HPI: Wanda Shields is a 89 y.o. female with medical history significant of diabetes mellitus type 2, hypertension, and more presents to the emergency room with a chief complaint of so weak she could not stand up.  Patient reports that she ambulates with a walker at baseline, but she just was not able to stand up on her own today.  It came on gradually in the morning yesterday.  She reports that she has had a good appetite and has been eating and drinking.  She denies any fevers.  She does report myalgias in her legs.  She denies cough, dyspnea, chest pain, palpitations.  She reports 1 month ago she had some diarrhea.  Now she is having some constipation.  She denies any vomiting.  Her electrolytes are abnormal, so I do not think her appetite is as good as she thinks it is.  Patient has no other complaints at this time.  Patient does not smoke, does not drink, does not use illicit drugs.  She has been vaccinated for COVID and flu but not for RSV.  Patient is DNR. Review of Systems: As mentioned in the history of present illness. All other systems reviewed and are negative. Past Medical History:  Diagnosis Date   Arthritis    Carpal tunnel syndrome    Diabetes mellitus without complication ([place])    Macular degeneration    Past Surgical History:  Procedure Laterality Date   ABDOMINAL HYSTERECTOMY     APPENDECTOMY     BACK SURGERY     CHOLECYSTECTOMY     HIP SURGERY     TONSILLECTOMY     Social History:  reports that she has never smoked. She has never used smokeless tobacco. She reports that she does not drink alcohol and does not use drugs.  Allergies  Allergen Reactions   Atropine    Demerol [Meperidine Hcl]    Penicillins     History  reviewed. No pertinent family history.  Prior to Admission medications   Medication Sig Start Date End Date Taking? Authorizing Provider  acetaminophen (ACETAMINOPHEN 8 HOUR) 650 MG CR tablet Take 650 mg by mouth every 6 (six) hours. DX: Polyosteoarthritis, unspecified   Yes [provider]  acetaminophen (TYLENOL) 500 MG tablet Take 1,000 mg by mouth every 6 (six) hours as needed.   Yes [provider]  alendronate (FOSAMAX) 70 MG tablet Take 1 tablet (70 mg total) by mouth once a week. Sunday 01/01/23  Yes Gerlene Fee, NP  Aspirin 81 MG CAPS Take 1 capsule by mouth daily.   Yes [provider]  Calcium Carb-Cholecalciferol (CALCIUM 600+D3 PO) Take 1 tablet by mouth 2 (two) times daily.   Yes [provider]  celecoxib (CELEBREX) 200 MG capsule Take 1 capsule (200 mg total) by mouth daily. 01/01/23  Yes Gerlene Fee, NP  Cholecalciferol (VITAMIN D3) 10 MCG (400 UNIT) tablet Take 400 Units by mouth daily. 02/11/22  Yes [provider]  diclofenac Sodium (VOLTAREN ARTHRITIS PAIN) 1 % GEL Apply 2 g topically 4 (four) times daily. 01/01/23  Yes Gerlene Fee, NP  docusate sodium (COLACE) 100 MG capsule Take 100-300 mg by mouth every evening.   Yes [provider]  insulin glargine (LANTUS SOLOSTAR) 100  UNIT/ML Solostar Pen Inject 10 Units into the skin at bedtime. 01/01/23  Yes Gerlene Fee, NP  lidocaine (HM LIDOCAINE PATCH) 4 % Place 1 patch onto the skin every 12 (twelve) hours.   Yes [provider]  lisinopril (ZESTRIL) 5 MG tablet Take 1 tablet (5 mg total) by mouth daily. Hold for systolic BP less than 426. 01/01/23  Yes Gerlene Fee, NP  metFORMIN (GLUCOPHAGE) 500 MG tablet Take 1 tablet (500 mg total) by mouth daily with breakfast. 01/01/23  Yes Gerlene Fee, NP  miconazole (MICRO GUARD) 2 % powder Apply topically. apply powder under (R) breast fungal rash q shift x 14 days Every Shift   Yes [provider]  Multiple Vitamins-Minerals (PRESERVISION AREDS PO) Take 1 tablet by mouth 2 (two) times daily.   Yes [provider]  ondansetron (ZOFRAN) 8 MG tablet Take 1 tablet (8 mg total) by mouth 3 (three) times daily as needed. 01/01/23  Yes Gerlene Fee, NP  polyethylene glycol (MIRALAX / GLYCOLAX) 17 g packet Take 17 g by mouth daily as needed for mild constipation or moderate constipation. 12/16/22  Yes Pokhrel, Laxman, MD  sitaGLIPtin (JANUVIA) 25 MG tablet Take 1 tablet (25 mg total) by mouth daily. 01/01/23  Yes Gerlene Fee, NP  sodium chloride 1 g tablet Take 1 tablet (1 g total) by mouth 2 (two) times daily with a meal. DX: Hypo-osmolality and hyponatremia 01/01/23  Yes Gerlene Fee, NP  vitamin B-12 (CYANOCOBALAMIN) 500 MCG tablet Take 500 mcg by mouth daily.   Yes [provider]  glipiZIDE (GLUCOTROL XL) 5 MG 24 hr tablet Take 5 mg by mouth daily. Patient not taking: Reported on 01/05/2023 01/02/23   [provider]  metFORMIN (GLUCOPHAGE-XR) 500 MG 24 hr tablet Take 1,000 mg by mouth 2 (two) times daily. Patient not taking: Reported on 01/05/2023 10/18/22   [provider]  UNABLE TO FIND Diet: _____ Regular,  ______ NAS, ___x____Consistent Carbohydrate,  _______NPO _____Other    [provider]    Physical Exam: Vitals:   01/05/23 1930 01/05/23 2030 01/05/23 2324 01/05/23 2330  BP: (!) 143/71 131/84  126/75  Pulse: (!) 56 (!) 54  (!) 58  Resp: (!) '22 20  18  '$ Temp:  98.1 F (36.7 C)  97.6 F (36.4 C)  TempSrc:  Oral  Oral  SpO2: 94% 100%  96%  Weight:   63.7 kg   Height:       1.  General: Patient lying supine in bed,  no acute distress   2. Psychiatric: Alert and oriented x 3, mood and behavior normal for situation, pleasant and cooperative with exam   3. Neurologic: Speech and language are normal, face is symmetric, moves all 4 extremities voluntarily, at baseline without acute deficits on limited exam   4. HEENMT:   Head is atraumatic, normocephalic, pupils reactive to light, neck is supple, trachea is midline, mucous membranes are moist   5. Respiratory : Lungs are clear to auscultation bilaterally without wheezing, rhonchi, rales, no cyanosis, no increase in work of breathing or accessory muscle use   6. Cardiovascular : Heart rate normal, rhythm is regular, murmur present, rubs or gallops, no peripheral edema, peripheral pulses palpated   7. Gastrointestinal:  Abdomen is soft, nondistended, nontender to palpation bowel sounds active, no masses or organomegaly palpated   8. Skin:  Skin is warm, dry and intact without rashes, acute lesions, or ulcers on limited exam  9.Musculoskeletal:  No acute deformities or trauma, no asymmetry in tone, no peripheral edema, peripheral pulses palpated, no tenderness to palpation in the extremities  Data Reviewed: In the ED Temp 98.1, heart rate 54-65, respiratory rate 16-23, blood pressure 117/65-143/84, satting at 100% pH 7.42, pCO2 24 No leukocytosis, hemoglobin 9.4 Chemistry reveals a hyponatremia at 123, hypomagnesemia at 1.5, albumin 2.8 RSV positive CT abdomen pelvis shows no acute intra-abdominal or pelvic finding Chest x-ray shows no acute cardiopulmonary process UA is not indicative of UTI Urine culture pending EKG shows a heart rate of 130, sinus rhythm, QTc 518-this tachycardia is consistent with dehydration BUN to creatinine ratio is 24:0.97 this is also consistent with dehydration LR 500 mL given in the ED Mag sulfate 2 g given in the ED, NS 500 given in the ED as well Admission requested for generalized weakness is likely due to infection, hyponatremia, hypomagnesemia, and dehydration  Assessment and Plan: * Generalized weakness - Generalized weakness at home requiring 2 person assist - Likely due to hyponatremia, dehydration and RSV - PT eval and treat - Supportive care for RSV - Encourage p.o. intake - Check TSH - Continue to  monitor  Hyponatremia - Sodium 123 - Was up to 126 at prior admission - Seems to be hypovolemic - Continue normal saline - Trend in the a.m. - Continue 1 g sodium tablets with meals - 500 mL bolus NS given in the ED - Likely contributing to generalized weakness  Hypomagnesemia - Magnesium 1.5 - 2 g magnesium repleted in the ED - Trend with a.m. labs  RSV (respiratory syncytial virus pneumonia) - RSV positive - Chest x-ray shows no acute cardiopulmonary process - Supportive care  Type 2 diabetes mellitus with chronic kidney disease, with long-term current use of insulin (HCC) - 10 units basal insulin at baseline - Holding basal insulin while in the hospital - Sliding scale coverage - Carb modified diet - Monitor CBGs      Advance Care Planning:   Code Status: DNR  Consults: None at this time  Family Communication: No family at bedside  Severity of Illness: The appropriate patient status for this patient is OBSERVATION. Observation status is judged to be reasonable and necessary in order to provide the required intensity of service to ensure the patient's safety. The patient's presenting symptoms, physical exam findings, and initial radiographic and laboratory data in the context of their medical condition is felt to place them at decreased risk for further clinical deterioration. Furthermore, it is anticipated that the patient will be medically stable for discharge from the hospital within 2 midnights of admission.   Author: Rolla Plate, DO 01/06/2023 3:41 AM  For on call review [email or web address].

## 2023-01-06 NOTE — Progress Notes (Signed)
[facility] Progress Note  Patient: Wanda Shields     HWE:[id number]  DOA: 01/05/2023   PCP: Manon Hilding, MD       Brief hospital course: This is a 89 year old female who was just admitted this morning. 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    Her last hospital stay was from 1228 through 12/2 for generalized weakness hyponatremia, constipation and a UTI.  She was discharged to [place] [facility].  According to the daughter-in-law, Manuela Schwartz, who is her main caretaker and who I have spoken today in person, she was still not doing well when she was discharged from the [facility] but was able to walk a little.  She is not sure whether I released her. Since discharge, she has gotten weaker and she has been constipated despite eating very well.  The daughter-in-law is not sure if she received the as needed MiraLAX while she was at the SNF.   The patient has been sneezing but has not had any cough or shortness of breath.  In the ED: She was found to have RSV & a sodium of 123.   Random urine sodium 69.  Mg 1.5 Started on 500 cc NS bolus, 500 cc lR bolus and IV normal saline @ 100 cc/hr - outpt sodium tabs continued by admitting MD  Subjective:  Continues to complain of constipation.  She has no idea why she is in the hospital.  Assessment and Plan: Principal Problem:   Generalized weakness due to RSV and hyponatremia and hypochloremia - recurrent> When she was last admitted, sodium was 126-this was treated in the hospital until it improved to 134- she was discharged on NaCL 1 gm TID - ? If her sugars have been high at SNF and contributing to low sodium   - per history, oral intake has been great but would like to see how she is eating in the hospital - received IV fluids a in ED and looked euvolemic by the time I saw her this AM -  TSH 2.408 - U sp gravity 1.009 - Na is now 129 Plan:  - hold NS  - B met every 8 hrs- awaiting repeat Bmet - cont NaCl 1 gm TID - U osm and S osm - strict I and O and documentation of meal intake requested  - symptomatic treatment for RSV (only sneezing right now) - PT/ OT eval  Active Problems:    Constipation - Daily Miralax - Dulcolax suppository now    Type 2 diabetes mellitus with chronic kidney disease, with long-term current use of insulin (HCC) - Novolog SSI and Semglee ordered - underwent CT abd/ pelvis w. Contrast on 1/22- hold Metformin  - hold Januvia that was just started on 1/3 and follow sugars today    Hypomagnesemia - replaced to 1.7- recheck tomorrow  HTN - Lisinopril with holding parameters- hold for SBP < 120  Arthritis - cont Voltaren gel and PRn Tyelnol only for now  H/o CVA - ASA 81 mg daily - no statin at her age  Normocytic anemia - Hgb ~ 9- 10        Code Status: DNR Consultants: none Level of Care: Level of care: Telemetry Total time on patient care: 50 min including extensive conversation with POA, Manuela Schwartz and conversation with RN DVT prophylaxis:  heparin injection 5,000 Units Start:  01/06/23 0600 SCDs Start: 01/05/23 2324     Objective:   Vitals:   01/05/23 2330 01/06/23 0358 01/06/23 0747 01/06/23 0925  BP: 126/75 127/76 114/64 (!) 144/68  Pulse: (!) 58 (!) 56 66   Resp: '18 18 20   '$ Temp: 97.6 F (36.4 C) 97.8 F (36.6 C) (!) 97.5 F (36.4 C)   TempSrc: Oral  Oral   SpO2: 96% 96%    Weight:      Height:       Filed Weights   01/05/23 1443 01/05/23 2324  Weight: 62 kg 63.7 kg   Exam: General exam: Appears comfortable  HEENT: oral mucosa moist Respiratory system: Clear to auscultation.  Cardiovascular system: S1 & S2 heard  Gastrointestinal system: Abdomen soft, non-tender, nondistended. Normal bowel sounds   Extremities: No cyanosis, clubbing or edema Psychiatry:  Mood & affect appropriate.      CBC: Recent  Labs  Lab 01/05/23 1616 01/05/23 1627 01/06/23 0355  WBC 9.0  --  7.3  NEUTROABS 5.8  --  4.2  HGB 9.4* 10.2* 9.7*  HCT 28.5* 30.0* 29.3*  MCV 93.4  --  92.7  PLT 244  --  734   Basic Metabolic Panel: Recent Labs  Lab 01/01/23 0140 01/05/23 1616 01/05/23 1627 01/06/23 0355 01/06/23 0937  NA 126* 123* 123* 128* 129*  K 4.9 4.9 5.1 4.0 4.2  CL 94* 89* 90* 95* 94*  CO2 24 23  --  25 24  GLUCOSE 128* 179* 170* 165* 235*  BUN 21 24* '21 17 15  '$ CREATININE 0.94 0.97 1.00 0.79 0.83  CALCIUM 10.3 8.8*  --  8.7* 8.6*  MG  --  1.5*  --  1.7  --    GFR: Estimated Creatinine Clearance: 42.2 mL/min (by C-G formula based on SCr of 0.83 mg/dL).  Scheduled Meds:  aspirin EC  81 mg Oral Daily   heparin  5,000 Units Subcutaneous Q8H   insulin aspart  0-15 Units Subcutaneous TID WC   insulin aspart  0-5 Units Subcutaneous QHS   lisinopril  5 mg Oral Daily   polyethylene glycol  17 g Oral Daily   sodium chloride  1 g Oral TID WC   Continuous Infusions:  sodium chloride 100 mL/hr at 01/05/23 2346   Imaging and lab data was personally reviewed CT ABDOMEN PELVIS W CONTRAST  Result Date: 01/05/2023 CLINICAL DATA:  Abdominal pain. EXAM: CT ABDOMEN AND PELVIS WITH CONTRAST TECHNIQUE: Multidetector CT imaging of the abdomen and pelvis was performed using the standard protocol following bolus administration of intravenous contrast. RADIATION DOSE REDUCTION: This exam was performed according to the departmental dose-optimization program which includes automated exposure control, adjustment of the mA and/or kV according to patient size and/or use of iterative reconstruction technique. CONTRAST:  19m OMNIPAQUE IOHEXOL 300 MG/ML  SOLN COMPARISON:  CT abdomen pelvis dated 06/01/2022. FINDINGS: Lower chest: Bibasilar linear atelectasis/scarring. The visualized lung bases are otherwise clear. There is coronary vascular calcification. No intra-abdominal free air or free fluid. Hepatobiliary: The liver is  unremarkable. Mild biliary dilatation, likely post cholecystectomy. No retained calcified stone noted in the central CBD. Pancreas: The pancreas is atrophic.  No active inflammatory changes. Spleen: Normal in size without focal abnormality. Adrenals/Urinary Tract: The adrenal glands are unremarkable. There is no hydronephrosis or nephrolithiasis on either side. Left renal upper pole 3 cm cyst. No imaging follow-up. There is extrarenal pelvis bilaterally with mild pelviectasis. The visualized ureters appear unremarkable. The urinary bladder is distended. Stomach/Bowel: There  is sigmoid diverticulosis without active inflammatory changes. There is large amount of stool throughout the colon. There is no bowel obstruction or active inflammation. The appendix is not visualized with certainty. No inflammatory changes identified in the right lower quadrant. Vascular/Lymphatic: Moderate aortoiliac atherosclerotic disease. The IVC is unremarkable. No portal venous gas. There is no adenopathy. Reproductive: Hysterectomy. The right ovary is grossly of the. The left ovary is not visualized. Other: None Musculoskeletal: Osteopenia with degenerative changes of the spine. Several old healed left rib fractures. Lower lumbar fusion and fixation screws through the left femoral neck. Similar appearance of fragmentation of the left femoral head. No acute osseous pathology. IMPRESSION: 1. No acute intra-abdominal or pelvic pathology. 2. Sigmoid diverticulosis. 3. Constipation.  No bowel obstruction. 4.  Aortic Atherosclerosis (ICD10-I70.0). Electronically Signed   By: Anner Crete M.D.   On: 01/05/2023 21:40   DG Chest Port 1 View  Result Date: 01/05/2023 CLINICAL DATA:  weakness EXAM: PORTABLE CHEST - 1 VIEW COMPARISON:  12/11/2022 FINDINGS: Cardiac silhouette is unremarkable. No pneumothorax or pleural effusion. The lungs are clear. Aorta is calcified. Old healed posterior left third through sixth rib fractures noted.  IMPRESSION: No acute cardiopulmonary process. Electronically Signed   By: Sammie Bench M.D.   On: 01/05/2023 16:30    LOS: 0 days   Author: Eunice Blase Tao Satz  01/06/2023 1:30 PM  To contact [facility]>   Check the care team in [facility] and look for the attending/consulting [place] provider listed  Log into [email or web address] and use [place]'s universal password   Go to> "[facility]"  and find provider  If you still have difficulty reaching the provider, please page the [facility] (Director on Call) for the Hospitalists listed on amion

## 2023-01-06 NOTE — Care Management Obs Status (Signed)
[place] NOTIFICATION   Patient Details  Name: Wanda Shields MRN: [id number] Date of Birth: December 24, 1929   Medicare Observation Status Notification Given:  Yes    Shade Flood, LCSW 01/06/2023, 2:41 PM

## 2023-01-06 NOTE — Progress Notes (Signed)
Patient arrived to room 304 around 2330. She is alert and oriented x4. Oriented pt and daughter in law to room and unit. Call bell education completed. She has a stage 3 pressure injury to her coccyx. Multiple scattered skin tears to bilateral lower extremities. Patient is from home she has a caregiver during the day and family stays with patient at night. She has no complaints during this shift.

## 2023-01-06 NOTE — Plan of Care (Signed)
  Problem: Acute Rehab PT Goals(only PT should resolve) Goal: Pt Will Go Supine/Side To Sit Outcome: Progressing Flowsheets (Taken 01/06/2023 1054) Pt will go Supine/Side to Sit: with minimal assist Goal: Patient Will Transfer Sit To/From Stand Outcome: Progressing Flowsheets (Taken 01/06/2023 1054) Patient will transfer sit to/from stand: with minimal assist Goal: Pt Will Transfer Bed To Chair/Chair To Bed Outcome: Progressing Flowsheets (Taken 01/06/2023 1054) Pt will Transfer Bed to Chair/Chair to Bed: with min assist Goal: Pt Will Ambulate Outcome: Progressing Flowsheets (Taken 01/06/2023 1054) Pt will Ambulate:  15 feet  with minimal assist  with rolling walker

## 2023-01-06 NOTE — TOC Initial Note (Signed)
Transition of Care [facility]) - Initial/Assessment Note    Patient Details  Name: Wanda Shields MRN: [id number] Date of Birth: 1930-02-22  Transition of Care [facility]) CM/SW Contact:    Shade Flood, LCSW Phone Number: 01/06/2023, 2:49 PM  Clinical Narrative:                  Pt admitted from home. She was discharged from [facility] after 17 days of rehab this past Saturday. Spoke with pt and also her daughter by phone today to assess. Pt lives alone though she does have family stay with her at night as of late. Pt was set up with [facility] dc from [facility]. PT/OT recommending short term rehab again at dc. Discussed with pt's daughter who states that it is pt's choice. Discussed with pt who states she is not sure what she wants to do. Encouraged pt to discuss with her family and TOC will follow up in AM.  Will follow.  Expected Discharge Plan: Skilled Nursing Facility Barriers to Discharge: Continued Medical Work up   Patient Goals and CMS Choice Patient states their goals for this hospitalization and ongoing recovery are:: get better CMS Medicare.gov Compare Post Acute Care list provided to:: Patient Choice offered to / list presented to : Patient      Expected Discharge Plan and Services In-house Referral: Clinical Social Work     Living arrangements for the past 2 months: Single Family Home                                      Prior Living Arrangements/Services Living arrangements for the past 2 months: Single Family Home Lives with:: Self Patient language and need for interpreter reviewed:: Yes Do you feel safe going back to the place where you live?: Yes      Need for Family Participation in Patient Care: Yes (Comment) Care giver support system in place?: Yes (comment) Current home services: DME, Home PT Criminal Activity/Legal Involvement Pertinent to Current Situation/Hospitalization: No - Comment as needed  Activities of Daily Living Home Assistive  Devices/Equipment: Bedside commode/3-in-1, Walker (specify type), Wheelchair ADL Screening (condition at time of admission) Patient's cognitive ability adequate to safely complete daily activities?: Yes Is the patient deaf or have difficulty hearing?: No Does the patient have difficulty seeing, even when wearing glasses/contacts?: No Does the patient have difficulty concentrating, remembering, or making decisions?: Yes Patient able to express need for assistance with ADLs?: Yes Does the patient have difficulty dressing or bathing?: Yes Independently performs ADLs?: Yes (appropriate for developmental age) Does the patient have difficulty walking or climbing stairs?: Yes Weakness of Legs: Both Weakness of Arms/Hands: None  Permission Sought/Granted                  Emotional Assessment Appearance:: Appears younger than stated age Attitude/Demeanor/Rapport: Engaged Affect (typically observed): Pleasant Orientation: : Oriented to Self, Oriented to Place, Oriented to  Time, Oriented to Situation Alcohol / Substance Use: Not Applicable Psych Involvement: No (comment)  Admission diagnosis:  Generalized weakness [R53.1] Patient Active Problem List   Diagnosis Date Noted   RSV (respiratory syncytial virus pneumonia) 01/05/2023   Hypomagnesemia 01/05/2023   Adult failure to thrive 12/18/2022   Type 2 diabetes mellitus with chronic kidney disease, with long-term current use of insulin ([place]) 12/17/2022   Hypertension associated with diabetes ([place]) 12/17/2022   Post-menopausal osteoporosis 12/17/2022   Bilateral  primary osteoarthritis of knee 12/17/2022   History of CVA (cerebrovascular accident) 12/17/2022   Hypokalemia 12/13/2022   Hyponatremia 12/11/2022   Generalized weakness 12/11/2022   UTI (urinary tract infection) 12/11/2022   PCP:  Manon Hilding, MD Pharmacy:   [place], [place] [address] Phone:  [phone] Fax: [phone]     Social Determinants of Health (SDOH) Social History: SDOH Screenings   Food Insecurity: No Food Insecurity (01/06/2023)  Recent Concern: Food Insecurity - Food Insecurity Present (12/12/2022)  Housing: Low Risk  (01/06/2023)  Transportation Needs: No Transportation Needs (01/06/2023)  Utilities: Not At Risk (01/06/2023)  Tobacco Use: Low Risk  (01/05/2023)   SDOH Interventions:     Readmission Risk Interventions     No data to display

## 2023-01-06 NOTE — Evaluation (Signed)
Physical Therapy Evaluation Patient Details Name: Wanda Shields MRN: [id number] DOB: 07-20-1930 Today's Date: 01/06/2023  History of Present Illness  Wanda Shields is a 89 y.o. female with medical history significant of diabetes mellitus type 2, hypertension, and more presents to the emergency room with a chief complaint of so weak she could not stand up.  Patient reports that she ambulates with a walker at baseline, but she just was not able to stand up on her own today.  It came on gradually in the morning yesterday.  She reports that she has had a good appetite and has been eating and drinking.  She denies any fevers.  She does report myalgias in her legs.  She denies cough, dyspnea, chest pain, palpitations.  She reports 1 month ago she had some diarrhea.  Now she is having some constipation.  She denies any vomiting.  Her electrolytes are abnormal, so I do not think her appetite is as good as she thinks it is.  Patient has no other complaints at this time.    Clinical Impression  Patient sitting up in chair (assisted by nursing). She is pleasant and agreeable to therapy evaluation.  Patient performs sit to stand to RW needing 2 attempts to complete with moderate assist from the therapist to RW.   Once standing; she is able to maintain her balance with min assist but needs increased assistance for balance with trying to march in place.  Patient needs verbal cues to return to sitting to reach for the chair and control her descent.  Patient will benefit from continued skilled therapy services during the remainder of her hospital stay and at the next recommended venue of care to address deficits and promote return to optimal function.         Recommendations for follow up therapy are one component of a multi-disciplinary discharge planning process, led by the attending physician.  Recommendations may be updated based on patient status, additional functional criteria and insurance  authorization.  Follow Up Recommendations Skilled nursing-short term rehab (<3 hours/day) Can patient physically be transported by private vehicle: No    Assistance Recommended at Discharge Frequent or constant Supervision/Assistance  Patient can return home with the following  A little help with bathing/dressing/bathroom;Assistance with cooking/housework;Assist for transportation;A lot of help with walking and/or transfers    Equipment Recommendations None recommended by PT  Recommendations for Other Services       Functional Status Assessment Patient has had a recent decline in their functional status and demonstrates the ability to make significant improvements in function in a reasonable and predictable amount of time.     Precautions / Restrictions Precautions Precautions: Fall Restrictions Weight Bearing Restrictions: No      Mobility  Bed Mobility                 Patient Response: Cooperative  Transfers Overall transfer level: Needs assistance Equipment used: Rolling walker (2 wheels) Transfers: Sit to/from Stand Sit to Stand: Mod assist           General transfer comment: has pain in her legs today    Ambulation/Gait               General Gait Details: marches in place x 2 reps each leg  Stairs            Wheelchair Mobility    Modified Rankin (Stroke Patients Only)       Balance Overall balance assessment: Needs assistance Sitting-balance support: Feet  supported, No upper extremity supported Sitting balance-Leahy Scale: Fair Sitting balance - Comments: seated edge of chair Postural control: Posterior lean Standing balance support: Bilateral upper extremity supported, Reliant on assistive device for balance Standing balance-Leahy Scale: Fair Standing balance comment: requires min A with RW to stand and march; starts to lose her balance with attempting to march                             Pertinent Vitals/Pain  Pain Assessment Pain Score: 5  Pain Location: back and legs Pain Descriptors / Indicators: Aching Pain Intervention(s): Limited activity within patient's tolerance    Home Living Family/patient expects to be discharged to:: Private residence Living Arrangements: Alone Available Help at Discharge: Family;Personal care attendant;Available 24 hours/day Type of Home: House Home Access: Ramped entrance       Home Layout: One level Home Equipment: Conservation officer, nature (2 wheels);Shower seat;Grab bars - toilet;Grab bars - tub/shower;Cane - single point;Wheelchair - manual;BSC/3in1      Prior Function Prior Level of Function : Needs assist             Mobility Comments: household ambulation with RW ADLs Comments: assisted by family and aid; has aide and family available 24/7     Hand Dominance   Dominant Hand: Right    Extremity/Trunk Assessment   Upper Extremity Assessment Upper Extremity Assessment: Generalized weakness    Lower Extremity Assessment Lower Extremity Assessment: Generalized weakness    Cervical / Trunk Assessment Cervical / Trunk Assessment: Kyphotic  Communication   Communication: HOH  Cognition Arousal/Alertness: Awake/alert Behavior During Therapy: WFL for tasks assessed/performed Overall Cognitive Status: Within Functional Limits for tasks assessed                                          General Comments      Exercises     Assessment/Plan    PT Assessment Patient needs continued PT services  PT Problem List Decreased strength;Decreased mobility;Decreased activity tolerance;Decreased balance       PT Treatment Interventions DME instruction;Therapeutic exercise;Gait training;Balance training;Manual techniques;Stair training;Neuromuscular re-education;Therapeutic activities;Patient/family education;Functional mobility training    PT Goals (Current goals can be found in the Care Plan section)  Acute Rehab PT Goals Patient  Stated Goal: return home PT Goal Formulation: With patient Time For Goal Achievement: 01/20/23 Potential to Achieve Goals: Good    Frequency Min 3X/week     Co-evaluation               AM-PAC PT "6 Clicks" Mobility  Outcome Measure Help needed turning from your back to your side while in a flat bed without using bedrails?: A Little Help needed moving from lying on your back to sitting on the side of a flat bed without using bedrails?: A Little Help needed moving to and from a bed to a chair (including a wheelchair)?: A Lot Help needed standing up from a chair using your arms (e.g., wheelchair or bedside chair)?: A Lot Help needed to walk in hospital room?: A Lot Help needed climbing 3-5 steps with a railing? : Total 6 Click Score: 13    End of Session Equipment Utilized During Treatment: Gait belt Activity Tolerance: Patient limited by pain Patient left: in chair;with family/visitor present;with call bell/phone within reach Nurse Communication: Mobility status PT Visit Diagnosis: Unsteadiness on feet (R26.81);Other abnormalities  of gait and mobility (R26.89);Muscle weakness (generalized) (M62.81);Repeated falls (R29.6)    Time: 5248-1859 PT Time Calculation (min) (ACUTE ONLY): 20 min   Charges:   PT Evaluation $PT Eval Low Complexity: 1 Low          10:53 AM, 01/06/23 Quinlan Mcfall Small Rayanne Padmanabhan MPT [facility] [place] [phone]

## 2023-01-07 DIAGNOSIS — L8990 Pressure ulcer of unspecified site, unspecified stage: Secondary | ICD-10-CM | POA: Insufficient documentation

## 2023-01-07 DIAGNOSIS — R531 Weakness: Secondary | ICD-10-CM | POA: Diagnosis not present

## 2023-01-07 LAB — CBC
HCT: 29.2 % — ABNORMAL LOW (ref 36.0–46.0)
Hemoglobin: 9.7 g/dL — ABNORMAL LOW (ref 12.0–15.0)
MCH: 30.9 pg (ref 26.0–34.0)
MCHC: 33.2 g/dL (ref 30.0–36.0)
MCV: 93 fL (ref 80.0–100.0)
Platelets: 266 10*3/uL (ref 150–400)
RBC: 3.14 MIL/uL — ABNORMAL LOW (ref 3.87–5.11)
RDW: 14.1 % (ref 11.5–15.5)
WBC: 14.1 10*3/uL — ABNORMAL HIGH (ref 4.0–10.5)
nRBC: 0 % (ref 0.0–0.2)

## 2023-01-07 LAB — BASIC METABOLIC PANEL
Anion gap: 9 (ref 5–15)
Anion gap: 9 (ref 5–15)
BUN: 18 mg/dL (ref 8–23)
BUN: 16 mg/dL (ref 8–23)
CO2: 24 mmol/L (ref 22–32)
CO2: 24 mmol/L (ref 22–32)
Calcium: 8.3 mg/dL — ABNORMAL LOW (ref 8.9–10.3)
Calcium: 8.6 mg/dL — ABNORMAL LOW (ref 8.9–10.3)
Chloride: 97 mmol/L — ABNORMAL LOW (ref 98–111)
Chloride: 97 mmol/L — ABNORMAL LOW (ref 98–111)
Creatinine, Ser: 0.88 mg/dL (ref 0.44–1.00)
Creatinine, Ser: 0.92 mg/dL (ref 0.44–1.00)
GFR, Estimated: >60 mL/min (ref >60)
GFR, Estimated: 59 mL/min — ABNORMAL LOW (ref >60)
Glucose, Bld: 162 mg/dL — ABNORMAL HIGH (ref 70–99)
Glucose, Bld: 188 mg/dL — ABNORMAL HIGH (ref 70–99)
Potassium: 3.7 mmol/L (ref 3.5–5.1)
Potassium: 3.7 mmol/L (ref 3.5–5.1)
Sodium: 130 mmol/L — ABNORMAL LOW (ref 135–145)
Sodium: 130 mmol/L — ABNORMAL LOW (ref 135–145)

## 2023-01-07 LAB — GLUCOSE, CAPILLARY
Glucose-Capillary: 123 mg/dL — ABNORMAL HIGH (ref 70–99)
Glucose-Capillary: 150 mg/dL — ABNORMAL HIGH (ref 70–99)
Glucose-Capillary: 190 mg/dL — ABNORMAL HIGH (ref 70–99)

## 2023-01-07 LAB — MAGNESIUM: Magnesium: 1.7 mg/dL (ref 1.7–2.4)

## 2023-01-07 LAB — URINE CULTURE

## 2023-01-07 LAB — TROPONIN I (HIGH SENSITIVITY): Troponin I (High Sensitivity): 7 ng/L (ref <18)

## 2023-01-07 LAB — OSMOLALITY, URINE: Osmolality, Ur: 243 mOsm/kg — ABNORMAL LOW (ref 300–900)

## 2023-01-07 LAB — OSMOLALITY: Osmolality: 281 mOsm/kg (ref 275–295)

## 2023-01-07 MED ORDER — ZINC OXIDE 40 % EX OINT
TOPICAL_OINTMENT | Freq: Four times a day (QID) | CUTANEOUS | Status: DC | PRN
  Filled 2023-01-07: qty 57

## 2023-01-07 MED ORDER — HYDROCORTISONE (PERIANAL) 2.5 % EX CREA
TOPICAL_CREAM | Freq: Four times a day (QID) | CUTANEOUS | Status: DC | PRN
  Filled 2023-01-07: qty 28.35

## 2023-01-07 MED ORDER — BISACODYL 10 MG RE SUPP: 10.0000 mg | Freq: Every day | RECTAL | Status: DC | PRN

## 2023-01-07 NOTE — Progress Notes (Signed)
Physical Therapy Treatment Patient Details Name: Wanda Shields MRN: [id number] DOB: 10-29-1930 Today's Date: 01/07/2023   History of Present Illness Wanda Shields is a 89 y.o. female with medical history significant of diabetes mellitus type 2, hypertension, and more presents to the emergency room with a chief complaint of so weak she could not stand up.  Patient reports that she ambulates with a walker at baseline, but she just was not able to stand up on her own today.  It came on gradually in the morning yesterday.  She reports that she has had a good appetite and has been eating and drinking.  She denies any fevers.  She does report myalgias in her legs.  She denies cough, dyspnea, chest pain, palpitations.  She reports 1 month ago she had some diarrhea.  Now she is having some constipation.  She denies any vomiting.  Her electrolytes are abnormal, so I do not think her appetite is as good as she thinks it is.  Patient has no other complaints at this time.    PT Comments    Pt was limited in today's treatment session, due to pain in buttocks area and increased fatigue. Today's session addressed BLE muscular strengthening and functional transfer practice. Pt noted with continued muscle weakness and poor postural control impairing patient ability to maintain independent sitting, and to perform independent functional transfers requiring mod assist to perform sit/stand and rolling walker. Pt would continue to benefit from skilled acute physical therapy services in order to progress toward POC goals, safety/independence with functional mobility and QOL.    Recommendations for follow up therapy are one component of a multi-disciplinary discharge planning process, led by the attending physician.  Recommendations may be updated based on patient status, additional functional criteria and insurance authorization.  Follow Up Recommendations  Skilled nursing-short term rehab (<3 hours/day) Can patient  physically be transported by private vehicle: No   Assistance Recommended at Discharge Frequent or constant Supervision/Assistance  Patient can return home with the following A little help with bathing/dressing/bathroom;Assistance with cooking/housework;Assist for transportation;A lot of help with walking and/or transfers   Equipment Recommendations  None recommended by PT    Recommendations for Other Services       Precautions / Restrictions Precautions Precautions: Fall Restrictions Weight Bearing Restrictions: No     Mobility  Bed Mobility Overal bed mobility: Needs Assistance Bed Mobility: Supine to Sit     Supine to sit: Supervision, HOB elevated, Mod assist     General bed mobility comments: labored movement, increased cueing for bed rail use.  Mod assist x 1 for sit/supine.  Modified independent for superior scoot and HOB with Trendelenburg position and BLE extension.    Transfers Overall transfer level: Needs assistance Equipment used: Rolling walker (2 wheels) Transfers: Sit to/from Stand Sit to Stand: Mod assist           General transfer comment: 4 times sit to stand from EOB the elevate.  Required increased cueing for anterior trans for weight, B UE UE use on rolling walker to assist with power up.  Poor eccentric control called back into the EOB, requiring min assist to safely control self to bed.  Limited limited pain and endurance.    Ambulation/Gait                   Stairs             Wheelchair Mobility    Modified Rankin (Stroke Patients Only)  Balance Overall balance assessment: Needs assistance Sitting-balance support: Feet supported, No upper extremity supported Sitting balance-Leahy Scale: Fair Sitting balance - Comments: seated edge of bed Postural control: Posterior lean Standing balance support: Bilateral upper extremity supported, Reliant on assistive device for balance Standing balance-Leahy Scale:  Poor Standing balance comment: poor to fair with RW; posterior lean.                            Cognition Arousal/Alertness: Awake/alert Behavior During Therapy: WFL for tasks assessed/performed Overall Cognitive Status: Within Functional Limits for tasks assessed                                          Exercises General Exercises - Lower Extremity Ankle Circles/Pumps: Both, 20 reps Short Arc Quad: Both, 20 reps Hip Flexion/Marching: AROM, Both, 20 reps    General Comments        Pertinent Vitals/Pain Pain Assessment Pain Assessment: 0-10 Pain Score: 10-Worst pain ever Pain Location: buttock area Pain Descriptors / Indicators: Aching, Sore Pain Intervention(s): Limited activity within patient's tolerance    Home Living                          Prior Function            PT Goals (current goals can now be found in the care plan section) Acute Rehab PT Goals PT Goal Formulation: With patient Time For Goal Achievement: 01/20/23 Potential to Achieve Goals: Good Progress towards PT goals: Progressing toward goals    Frequency    Min 3X/week      PT Plan Current plan remains appropriate    Co-evaluation              AM-PAC PT "6 Clicks" Mobility   Outcome Measure  Help needed turning from your back to your side while in a flat bed without using bedrails?: A Little Help needed moving from lying on your back to sitting on the side of a flat bed without using bedrails?: A Little Help needed moving to and from a bed to a chair (including a wheelchair)?: A Lot Help needed standing up from a chair using your arms (e.g., wheelchair or bedside chair)?: A Lot Help needed to walk in hospital room?: A Lot Help needed climbing 3-5 steps with a railing? : Total 6 Click Score: 13    End of Session Equipment Utilized During Treatment: Gait belt Activity Tolerance: Patient limited by pain Patient left: in chair;with  family/visitor present;with call bell/phone within reach Nurse Communication: Mobility status PT Visit Diagnosis: Unsteadiness on feet (R26.81);Other abnormalities of gait and mobility (R26.89);Muscle weakness (generalized) (M62.81);Repeated falls (R29.6)     Time: 5625-6389 PT Time Calculation (min) (ACUTE ONLY): 14 min  Charges:  $Therapeutic Exercise: 8-22 mins                     Wonda Olds PT, DPT Physical Therapist with [place] [phone] office    Wonda Olds 01/07/2023, 4:04 PM

## 2023-01-07 NOTE — Progress Notes (Signed)
Patient rested well during this shift. She got up to the [facility] and had a small BM. Around 2338 this writer noted that patient HR was 90-160. This Probation officer went and assessed patient. Radial pulse taken and is noted to be 64 and irregular. No complaints of chest pain, shortness of breath, palpitations, dizziness. Pt is not in distress att this time. Notified Dr. Clearence Ped. See new orders. EKG completed.

## 2023-01-07 NOTE — NC FL2 (Signed)
[place] LEVEL OF CARE FORM     IDENTIFICATION  Patient Name: Wanda Shields Birthdate: Jun 23, 1930 Sex: female Admission Date (Current Location): 01/05/2023  [facility] and [place] Number:  [employer] and Address:  [facility] [address], [place]      Provider Number: [id number]  Attending Physician Name and Address:  Dessa Phi, DO  Relative Name and Phone Number:       Current Level of Care: Hospital Recommended Level of Care: [place] Prior Approval Number:    Date Approved/Denied:   PASRR Number: [id number]  Discharge Plan: SNF    Current Diagnoses: Patient Active Problem List   Diagnosis Date Noted   Pressure injury of skin 01/07/2023   RSV (respiratory syncytial virus pneumonia) 01/05/2023   Hypomagnesemia 01/05/2023   Adult failure to thrive 12/18/2022   Type 2 diabetes mellitus with chronic kidney disease, with long-term current use of insulin ([place]) 12/17/2022   Hypertension associated with diabetes ([place]) 12/17/2022   Post-menopausal osteoporosis 12/17/2022   Bilateral primary osteoarthritis of knee 12/17/2022   History of CVA (cerebrovascular accident) 12/17/2022   Hypokalemia 12/13/2022   Hyponatremia 12/11/2022   Generalized weakness 12/11/2022   UTI (urinary tract infection) 12/11/2022    Orientation RESPIRATION BLADDER Height & Weight     Self, Time, Situation, Place  Normal Continent Weight: 140 lb 6.9 oz (63.7 kg) Height:  '5\' 6"'$  (167.6 cm)  BEHAVIORAL SYMPTOMS/MOOD NEUROLOGICAL BOWEL NUTRITION STATUS      Continent Diet (see dc summary)  AMBULATORY STATUS COMMUNICATION OF NEEDS Skin   Extensive Assist Verbally Normal                       Personal Care Assistance Level of Assistance    Bathing Assistance: Maximum assistance Feeding assistance: Independent Dressing Assistance: Limited assistance     Functional Limitations Info    Sight Info: Adequate Hearing  Info: Adequate Speech Info: Adequate    SPECIAL CARE FACTORS FREQUENCY  PT (By licensed PT), OT (By licensed OT)     PT Frequency: 5x week OT Frequency: 3x week            Contractures Contractures Info: Not present    Additional Factors Info    Code Status Info: DNR Allergies Info: Atropine, Demerol, Penicillins           Current Medications (01/07/2023):  This is the current hospital active medication list Current Facility-Administered Medications  Medication Dose Route Frequency Provider Last Rate Last Admin   acetaminophen (TYLENOL) tablet 650 mg  650 mg Oral Q6H PRN Zierle-Ghosh, Asia B, DO   650 mg at 01/07/23 8315   Or   acetaminophen (TYLENOL) suppository 650 mg  650 mg Rectal Q6H PRN Zierle-Ghosh, Asia B, DO       aspirin EC tablet 81 mg  81 mg Oral Daily Zierle-Ghosh, Asia B, DO   81 mg at 01/07/23 0829   bisacodyl (DULCOLAX) suppository 10 mg  10 mg Rectal Daily PRN Dessa Phi, DO       diclofenac Sodium (VOLTAREN) 1 % topical gel 2 g  2 g Topical QID Rizwan, Eunice Blase, MD   2 g at 01/07/23 1353   heparin injection 5,000 Units  5,000 Units Subcutaneous Q8H Zierle-Ghosh, Asia B, DO   5,000 Units at 01/07/23 1353   hydrocortisone (ANUSOL-HC) 2.5 % rectal cream   Rectal QID PRN Dessa Phi, DO  insulin aspart (novoLOG) injection 0-15 Units  0-15 Units Subcutaneous TID WC Zierle-Ghosh, Asia B, DO   2 Units at 01/07/23 1207   insulin aspart (novoLOG) injection 0-5 Units  0-5 Units Subcutaneous QHS Zierle-Ghosh, Asia B, DO       insulin glargine-yfgn (SEMGLEE) injection 10 Units  10 Units Subcutaneous QHS Debbe Odea, MD   10 Units at 01/06/23 2148   liver oil-zinc oxide (DESITIN) 40 % ointment   Topical QID PRN Dessa Phi, DO       ondansetron (ZOFRAN) tablet 4 mg  4 mg Oral Q6H PRN Zierle-Ghosh, Asia B, DO       Or   ondansetron (ZOFRAN) injection 4 mg  4 mg Intravenous Q6H PRN Zierle-Ghosh, Asia B, DO       polyethylene glycol (MIRALAX / GLYCOLAX)  packet 17 g  17 g Oral Daily Zierle-Ghosh, Asia B, DO   17 g at 01/07/23 2902   sodium chloride tablet 1 g  1 g Oral TID WC Zierle-Ghosh, Asia B, DO   1 g at 01/07/23 1207     Discharge Medications: Please see discharge summary for a list of discharge medications.  Relevant Imaging Results:  Relevant Lab Results:   Additional Information SSN: [id number] [place]

## 2023-01-07 NOTE — TOC Progression Note (Signed)
Transition of Care [facility]) - Progression Note    Patient Details  Name: Wanda Shields MRN: [id number] Date of Birth: 22-Feb-1930  [facility]) CM/SW Contact  Shade Flood, LCSW Phone Number: 01/07/2023, 2:42 PM  Clinical Narrative:     TOC following. Spoke with pt to review dc planning. Pt states that she is agreeable to return to [facility] if DIL Manuela Schwartz thinks she should. Spoke with Manuela Schwartz who states she really wants this to be pt's decision but if pt agreeable then she would like TOC to refer pt back to [facility].   Referral made and discussed with Kerri at [facility]. TOC to start insurance auth in anticipation of possible dc tomorrow.  Will follow.  Expected Discharge Plan: [place] Barriers to Discharge: Continued Medical Work up  Expected Discharge Plan and Services In-house Referral: Clinical Social Work   Post Acute Care Choice: [place] Living arrangements for the past 2 months: Single Family Home                                       Social Determinants of Health (SDOH) Interventions SDOH Screenings   Food Insecurity: No Food Insecurity (01/06/2023)  Recent Concern: [place] Present (12/12/2022)  Housing: Low Risk  (01/06/2023)  Transportation Needs: No Transportation Needs (01/06/2023)  Utilities: Not At Risk (01/06/2023)  Tobacco Use: Low Risk  (01/05/2023)    Readmission Risk Interventions     No data to display

## 2023-01-07 NOTE — Progress Notes (Signed)
PROGRESS NOTE    Wanda Shields  VOJ:[id number] DOB: 1930-11-18 DOA: 01/05/2023 PCP: Manon Hilding, MD     Brief Narrative:  Wanda Shields is a 89 year old female who was admitted to [facility] 01/05/23. 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     Her last hospital stay was from 12/11/22 through 12/16/22 for generalized weakness, hyponatremia, constipation and a UTI.  She was discharged to [facility].   According to the daughter-in-law, Manuela Schwartz, who is her main caretaker, patient was still not doing well when she was discharged from the [facility] but was able to walk a little.  Since discharge, she has gotten weaker and she has been constipated despite eating very well.  The daughter-in-law is not sure if she received the as needed MiraLAX while she was at the SNF.  The patient has been sneezing but has not had any cough or shortness of breath.   In the ED: She was found to have RSV & a sodium of 123. Mg 1.5. She was started on 500 cc NS bolus, 500 cc lR bolus and IV normal saline @ 100 cc/hr.  New events last 24 hours / Subjective: Patient continues to be very weak.  She had couple episodes of sneezing but no congestion, shortness of breath, cough.  Had some heart palpitations overnight.  Assessment & Plan:   Principal Problem:   Generalized weakness Active Problems:   Hyponatremia   Type 2 diabetes mellitus with chronic kidney disease, with long-term current use of insulin (HCC)   History of CVA (cerebrovascular accident)   RSV (respiratory syncytial virus pneumonia)   Hypomagnesemia   Pressure injury of skin   Generalized weakness -PT OT recommending SNF placement.  Patient contemplating whether she would like to go back to [facility] versus return home  Hyponatremia -Osmolality 281 -Urine sodium 69 --> stop lisinopril   -Urine osmol 243  --> not consistent with SIADH  -TSH normal 2.408  -Total protein low 5.4  -Check morning cortisol, triglyceride -Continue sodium chloride tablets 3 times daily  RSV -Overall asymptomatic  Diabetes mellitus -Semglee, Sliding scale insulin  History of CVA -Aspirin  Constipation -MiraLAX, Dulcolax suppository    In agreement with assessment of the pressure ulcer as below:     Pressure Injury 01/06/23 Coccyx Medial Stage 3 -  Full thickness tissue loss. Subcutaneous fat may be visible but bone, tendon or muscle are NOT exposed. (Active)  01/06/23 0049  Location: Coccyx  Location Orientation: Medial  Staging: Stage 3 -  Full thickness tissue loss. Subcutaneous fat may be visible but bone, tendon or muscle are NOT exposed.  Wound Description (Comments):   Present on Admission: Yes  Dressing Type Foam - Lift dressing to assess site every shift 01/07/23 1000         DVT prophylaxis:  heparin injection 5,000 Units Start: 01/06/23 0600 SCDs Start: 01/05/23 2324  Code Status: DNR Family Communication: Son and daughter-in-law at bedside Disposition Plan:  Status is: Observation The patient will require care spanning > 2 midnights and should be moved to inpatient because: Work up of hyponatremia pending, remains very weak. Need SNF placement    Antimicrobials:  Anti-infectives (From admission, onward)    None        Objective: Vitals:   01/06/23 1912 01/06/23 2045 01/07/23 0350 01/07/23 9937  BP: (!) 127/56 136/64 115/64 116/63  Pulse: 65 65 64 78  Resp: '18 16 18   '$ Temp: 97.7 F (36.5 C) 97.7 F (36.5 C) 98 F (36.7 C)   TempSrc: Oral Oral    SpO2: 100% 97% 96%   Weight:      Height:        Intake/Output Summary (Last 24 hours) at 01/07/2023 1048 Last data filed at 01/07/2023 1000 Gross per 24 hour  Intake 480 ml  Output 1 ml  Net 479 ml   Filed Weights   01/05/23 1443 01/05/23 2324  Weight: 62 kg 63.7 kg    Examination:   General exam: Appears calm and comfortable, HOH  Respiratory system: Clear to auscultation. Respiratory effort normal. No respiratory distress. No conversational dyspnea.  Cardiovascular system: S1 & S2 heard, RRR. No murmurs. No pedal edema. Gastrointestinal system: Abdomen is nondistended, soft and nontender. Normal bowel sounds heard. Central nervous system: Alert and oriented.  Extremities: Symmetric in appearance  Psychiatry: Judgement and insight appear normal. Mood & affect appropriate.   Data Reviewed: I have personally reviewed following labs and imaging studies  CBC: Recent Labs  Lab 01/05/23 1616 01/05/23 1627 01/06/23 0355 01/07/23 0117  WBC 9.0  --  7.3 14.1*  NEUTROABS 5.8  --  4.2  --   HGB 9.4* 10.2* 9.7* 9.7*  HCT 28.5* 30.0* 29.3* 29.2*  MCV 93.4  --  92.7 93.0  PLT 244  --  254 892   Basic Metabolic Panel: Recent Labs  Lab 01/05/23 1616 01/05/23 1627 01/06/23 0355 01/06/23 0937 01/06/23 1848 01/07/23 0117 01/07/23 0909  NA 123*   < > 128* 129* 128* 130* 130*  K 4.9   < > 4.0 4.2 4.3 3.7 3.7  CL 89*   < > 95* 94* 95* 97* 97*  CO2 23  --  '25 24 23 24 24  '$ GLUCOSE 179*   < > 165* 235* 169* 162* 188*  BUN 24*   < > '17 15 20 18 16  '$ CREATININE 0.97   < > 0.79 0.83 0.92 0.88 0.92  CALCIUM 8.8*  --  8.7* 8.6* 8.0* 8.3* 8.6*  MG 1.5*  --  1.7  --   --  1.7  --    < > = values in this interval not displayed.   GFR: Estimated Creatinine Clearance: 38 mL/min (by C-G formula based on SCr of 0.92 mg/dL). Liver Function Tests: Recent Labs  Lab 01/05/23 1616 01/06/23 0355  AST 19 16  ALT 14 13  ALKPHOS 37* 33*  BILITOT 0.9 0.9  PROT 5.4* 5.0*  ALBUMIN 2.8* 2.6*   No results for input(s): "LIPASE", "AMYLASE" in the last 168 hours. No results for input(s): "AMMONIA" in the last 168 hours. Coagulation Profile: No results for input(s): "INR", "PROTIME" in the last 168 hours. Cardiac Enzymes: Recent Labs  Lab 01/05/23 1616  CKTOTAL 112   BNP (last 3  results) No results for input(s): "PROBNP" in the last 8760 hours. HbA1C: No results for input(s): "HGBA1C" in the last 72 hours. CBG: Recent Labs  Lab 01/06/23 1218 01/06/23 1631 01/06/23 2047 01/07/23 0732  GLUCAP 206* 131* 164* 123*   Lipid Profile: No results for input(s): "CHOL", "HDL", "LDLCALC", "TRIG", "CHOLHDL", "LDLDIRECT" in the last 72 hours. Thyroid Function Tests: Recent Labs    01/06/23 0355  TSH 2.408   Anemia Panel: No results for input(s): "VITAMINB12", "FOLATE", "FERRITIN", "TIBC", "IRON", "RETICCTPCT" in the last 72 hours. Sepsis Labs: No results  for input(s): "PROCALCITON", "LATICACIDVEN" in the last 168 hours.  Recent Results (from the past 240 hour(s))  Resp panel by RT-PCR (RSV, Flu A&B, Covid) Anterior Nasal Swab     Status: Abnormal   Collection Time: 01/05/23  4:26 PM   Specimen: Anterior Nasal Swab  Result Value Ref Range Status   SARS Coronavirus 2 by RT PCR NEGATIVE NEGATIVE Final    Comment: (NOTE) SARS-CoV-2 target nucleic acids are NOT DETECTED.  The SARS-CoV-2 RNA is generally detectable in upper respiratory specimens during the acute phase of infection. The lowest concentration of SARS-CoV-2 viral copies this assay can detect is 138 copies/mL. A negative result does not preclude SARS-Cov-2 infection and should not be used as the sole basis for treatment or other patient management decisions. A negative result may occur with  improper specimen collection/handling, submission of specimen other than nasopharyngeal swab, presence of viral mutation(s) within the areas targeted by this assay, and inadequate number of viral copies(<138 copies/mL). A negative result must be combined with clinical observations, patient history, and epidemiological information. The expected result is Negative.  Fact Sheet for Patients:  EntrepreneurPulse.com.au  Fact Sheet for Healthcare Providers:   IncredibleEmployment.be  This test is no t yet approved or cleared by the [place] FDA and  has been authorized for detection and/or diagnosis of SARS-CoV-2 by FDA under an Emergency Use Authorization (EUA). This EUA will remain  in effect (meaning this test can be used) for the duration of the COVID-19 declaration under Section 564(b)(1) of the Act, 21 U.S.C.section 360bbb-3(b)(1), unless the authorization is terminated  or revoked sooner.       Influenza A by PCR NEGATIVE NEGATIVE Final   Influenza B by PCR NEGATIVE NEGATIVE Final    Comment: (NOTE) The Xpert Xpress SARS-CoV-2/FLU/RSV plus assay is intended as an aid in the diagnosis of influenza from Nasopharyngeal swab specimens and should not be used as a sole basis for treatment. Nasal washings and aspirates are unacceptable for Xpert Xpress SARS-CoV-2/FLU/RSV testing.  Fact Sheet for Patients: EntrepreneurPulse.com.au  Fact Sheet for Healthcare Providers: IncredibleEmployment.be  This test is not yet approved or cleared by the [place] FDA and has been authorized for detection and/or diagnosis of SARS-CoV-2 by FDA under an Emergency Use Authorization (EUA). This EUA will remain in effect (meaning this test can be used) for the duration of the COVID-19 declaration under Section 564(b)(1) of the Act, 21 U.S.C. section 360bbb-3(b)(1), unless the authorization is terminated or revoked.     Resp Syncytial Virus by PCR POSITIVE (A) NEGATIVE Final    Comment: (NOTE) Fact Sheet for Patients: EntrepreneurPulse.com.au  Fact Sheet for Healthcare Providers: IncredibleEmployment.be  This test is not yet approved or cleared by the [place] FDA and has been authorized for detection and/or diagnosis of SARS-CoV-2 by FDA under an Emergency Use Authorization (EUA). This EUA will remain in effect (meaning this test can be used)  for the duration of the COVID-19 declaration under Section 564(b)(1) of the Act, 21 U.S.C. section 360bbb-3(b)(1), unless the authorization is terminated or revoked.  Performed at [facility], [address], [place], [place] [address]       Radiology Studies: CT ABDOMEN PELVIS W CONTRAST  Result Date: 01/05/2023 CLINICAL DATA:  Abdominal pain. EXAM: CT ABDOMEN AND PELVIS WITH CONTRAST TECHNIQUE: Multidetector CT imaging of the abdomen and pelvis was performed using the standard protocol following bolus administration of intravenous contrast. RADIATION DOSE REDUCTION: This exam was performed according to the departmental dose-optimization program which  includes automated exposure control, adjustment of the mA and/or kV according to patient size and/or use of iterative reconstruction technique. CONTRAST:  42m OMNIPAQUE IOHEXOL 300 MG/ML  SOLN COMPARISON:  CT abdomen pelvis dated 06/01/2022. FINDINGS: Lower chest: Bibasilar linear atelectasis/scarring. The visualized lung bases are otherwise clear. There is coronary vascular calcification. No intra-abdominal free air or free fluid. Hepatobiliary: The liver is unremarkable. Mild biliary dilatation, likely post cholecystectomy. No retained calcified stone noted in the central CBD. Pancreas: The pancreas is atrophic.  No active inflammatory changes. Spleen: Normal in size without focal abnormality. Adrenals/Urinary Tract: The adrenal glands are unremarkable. There is no hydronephrosis or nephrolithiasis on either side. Left renal upper pole 3 cm cyst. No imaging follow-up. There is extrarenal pelvis bilaterally with mild pelviectasis. The visualized ureters appear unremarkable. The urinary bladder is distended. Stomach/Bowel: There is sigmoid diverticulosis without active inflammatory changes. There is large amount of stool throughout the colon. There is no bowel obstruction or active inflammation. The appendix is not visualized with certainty. No  inflammatory changes identified in the right lower quadrant. Vascular/Lymphatic: Moderate aortoiliac atherosclerotic disease. The IVC is unremarkable. No portal venous gas. There is no adenopathy. Reproductive: Hysterectomy. The right ovary is grossly of the. The left ovary is not visualized. Other: None Musculoskeletal: Osteopenia with degenerative changes of the spine. Several old healed left rib fractures. Lower lumbar fusion and fixation screws through the left femoral neck. Similar appearance of fragmentation of the left femoral head. No acute osseous pathology. IMPRESSION: 1. No acute intra-abdominal or pelvic pathology. 2. Sigmoid diverticulosis. 3. Constipation.  No bowel obstruction. 4.  Aortic Atherosclerosis (ICD10-I70.0). Electronically Signed   By: AAnner CreteM.D.   On: 01/05/2023 21:40   DG Chest Port 1 View  Result Date: 01/05/2023 CLINICAL DATA:  weakness EXAM: PORTABLE CHEST - 1 VIEW COMPARISON:  12/11/2022 FINDINGS: Cardiac silhouette is unremarkable. No pneumothorax or pleural effusion. The lungs are clear. Aorta is calcified. Old healed posterior left third through sixth rib fractures noted. IMPRESSION: No acute cardiopulmonary process. Electronically Signed   By: JSammie BenchM.D.   On: 01/05/2023 16:30      Scheduled Meds:  aspirin EC  81 mg Oral Daily   diclofenac Sodium  2 g Topical QID   heparin  5,000 Units Subcutaneous Q8H   insulin aspart  0-15 Units Subcutaneous TID WC   insulin aspart  0-5 Units Subcutaneous QHS   insulin glargine-yfgn  10 Units Subcutaneous QHS   polyethylene glycol  17 g Oral Daily   sodium chloride  1 g Oral TID WC   Continuous Infusions:   LOS: 0 days   Time spent: 35 minutes   JDessa Phi DO [facility] 01/07/2023, 10:48 AM   Available via Epic secure chat 7am-7pm After these hours, please refer to coverage provider listed on [email or web address]

## 2023-01-07 NOTE — Evaluation (Signed)
Occupational Therapy Evaluation Patient Details Name: Wanda Shields MRN: [id number] DOB: February 21, 1930 Today's Date: 01/07/2023   History of Present Illness Wanda Shields is a 89 y.o. female with medical history significant of diabetes mellitus type 2, hypertension, and more presents to the emergency room with a chief complaint of so weak she could not stand up.  Patient reports that she ambulates with a walker at baseline, but she just was not able to stand up on her own today.  It came on gradually in the morning yesterday.  She reports that she has had a good appetite and has been eating and drinking.  She denies any fevers.  She does report myalgias in her legs.  She denies cough, dyspnea, chest pain, palpitations.  She reports 1 month ago she had some diarrhea.  Now she is having some constipation.  She denies any vomiting.  Her electrolytes are abnormal, so I do not think her appetite is as good as she thinks it is.  Patient has no other complaints at this time.   Clinical Impression   Pt agreeable to OT evaluation. Pt has macular degeneration at baseline with poor vision. Today pt was generally weak with limited shoulder A/ROM bilaterally. L UE reportedly limited at baseline. Pt required max A for per-care but was able to transfer to [facility] with mod A. Pt requires much extended time and assist due to posterior lean during standing and transfers. Pt left in chair with family present and call bell within reach. Pt will benefit from continued OT in the hospital and recommended venue below to increase strength, balance, and endurance for safe ADL's.         Recommendations for follow up therapy are one component of a multi-disciplinary discharge planning process, led by the attending physician.  Recommendations may be updated based on patient status, additional functional criteria and insurance authorization.   Follow Up Recommendations  Skilled nursing-short term rehab (<3 hours/day)      Assistance Recommended at Discharge Intermittent Supervision/Assistance  Patient can return home with the following A lot of help with walking and/or transfers;A lot of help with bathing/dressing/bathroom;Assistance with cooking/housework;Assist for transportation;Help with stairs or ramp for entrance    Functional Status Assessment  Patient has had a recent decline in their functional status and demonstrates the ability to make significant improvements in function in a reasonable and predictable amount of time.  Equipment Recommendations  None recommended by OT           Precautions / Restrictions Precautions Precautions: Fall Restrictions Weight Bearing Restrictions: No      Mobility Bed Mobility Overal bed mobility: Needs Assistance Bed Mobility: Supine to Sit     Supine to sit: Supervision, HOB elevated     General bed mobility comments: labored movement    Transfers Overall transfer level: Needs assistance Equipment used: Rolling walker (2 wheels) Transfers: Sit to/from Stand, Bed to chair/wheelchair/BSC Sit to Stand: Mod assist     Step pivot transfers: Mod assist     General transfer comment: Posterior leaning; labored and unsteady steps to chair and BSC.      Balance Overall balance assessment: Needs assistance Sitting-balance support: Feet supported, No upper extremity supported Sitting balance-Leahy Scale: Fair Sitting balance - Comments: seated edge of bed   Standing balance support: Bilateral upper extremity supported, Reliant on assistive device for balance Standing balance-Leahy Scale: Poor Standing balance comment: poor to fair with RW; posterior lean.  ADL either performed or assessed with clinical judgement   ADL Overall ADL's : Needs assistance/impaired     Grooming: Moderate assistance;Minimal assistance;Sitting   Upper Body Bathing: Minimal assistance;Moderate assistance;Sitting   Lower Body  Bathing: Maximal assistance;Sitting/lateral leans   Upper Body Dressing : Minimal assistance;Moderate assistance;Sitting   Lower Body Dressing: Maximal assistance;Sitting/lateral leans   Toilet Transfer: Moderate assistance;Rolling walker (2 wheels);Stand-pivot Armed forces technical officer Details (indicate cue type and reason): EOB to [facility] and then to chair with mod A using RW Toileting- Clothing Manipulation and Hygiene: Maximal assistance;Sitting/lateral lean Toileting - Clothing Manipulation Details (indicate cue type and reason): Pt able to stand with assist with this therapist compelted peri-care for pt.     Functional mobility during ADLs: Moderate assistance;Rolling walker (2 wheels) General ADL Comments: Slow labored movement; weak with poor balance.     Vision Baseline Vision/History: 6 Macular Degeneration Ability to See in Adequate Light: 3 Highly impaired Patient Visual Report: No change from baseline Vision Assessment?:  (baseline deficits)                Pertinent Vitals/Pain Pain Assessment Pain Assessment: 0-10 Pain Score: 10-Worst pain ever Pain Location: buttock area Pain Descriptors / Indicators: Aching, Sore Pain Intervention(s): Limited activity within patient's tolerance, Monitored during session, Repositioned     Hand Dominance Right   Extremity/Trunk Assessment Upper Extremity Assessment Upper Extremity Assessment: RUE deficits/detail;LUE deficits/detail RUE Deficits / Details: 2+/5 shoulder flexion; generally weak otherwise. LUE Deficits / Details: 2+/5 shoulder flexion; generally weak otherwise. ~ 75% available P/ROM for shoulder flexion.   Lower Extremity Assessment Lower Extremity Assessment: Defer to PT evaluation   Cervical / Trunk Assessment Cervical / Trunk Assessment: Kyphotic   Communication Communication Communication: HOH   Cognition Arousal/Alertness: Awake/alert Behavior During Therapy: WFL for tasks assessed/performed Overall Cognitive  Status: Within Functional Limits for tasks assessed                                                        Home Living Family/patient expects to be discharged to:: Private residence Living Arrangements: Alone Available Help at Discharge: Family;Personal care attendant;Available 24 hours/day Type of Home: House Home Access: Ramped entrance     Home Layout: One level     Bathroom Shower/Tub: Teacher, early years/pre: Handicapped height Bathroom Accessibility: Yes   Home Equipment: Conservation officer, nature (2 wheels);Shower seat;Grab bars - toilet;Grab bars - tub/shower;Cane - single point;Wheelchair - manual;BSC/3in1   Additional Comments: per PT note      Prior Functioning/Environment Prior Level of Function : Needs assist       Physical Assist : ADLs (physical)   ADLs (physical): Dressing;Bathing;IADLs Mobility Comments: household ambulation with RW ADLs Comments: Assist from aide for bathign and dressing. Likely assisted for IADL's.        OT Problem List: Decreased strength;Decreased range of motion;Decreased activity tolerance;Impaired balance (sitting and/or standing);Pain      OT Treatment/Interventions: Self-care/ADL training;Therapeutic exercise;Patient/family education;Therapeutic activities;Balance training;Visual/perceptual remediation/compensation    OT Goals(Current goals can be found in the care plan section) Acute Rehab OT Goals Patient Stated Goal: Get stronger at rehab. OT Goal Formulation: With patient Time For Goal Achievement: 01/21/23 Potential to Achieve Goals: Good  OT Frequency: Min 2X/week  AM-PAC OT "6 Clicks" Daily Activity     Outcome Measure Help from another person eating meals?: A Little Help from another person taking care of personal grooming?: A Little Help from another person toileting, which includes using toliet, bedpan, or urinal?: A Lot Help from another person bathing (including  washing, rinsing, drying)?: A Lot Help from another person to put on and taking off regular upper body clothing?: A Lot Help from another person to put on and taking off regular lower body clothing?: A Lot 6 Click Score: 14   End of Session Equipment Utilized During Treatment: Gait belt;Rolling walker (2 wheels) Nurse Communication: Other (comment) (Notified pt was in chair.)  Activity Tolerance: Patient tolerated treatment well Patient left: in chair;with call bell/phone within reach;with family/visitor present  OT Visit Diagnosis: Unsteadiness on feet (R26.81);Other abnormalities of gait and mobility (R26.89);Muscle weakness (generalized) (M62.81)                Time: 4259-5638 OT Time Calculation (min): 29 min Charges:  OT General Charges $OT Visit: 1 Visit OT Evaluation $OT Eval Low Complexity: 1 Low  Wanda Shields OT, MOT  Larey Seat 01/07/2023, 9:54 AM

## 2023-01-07 NOTE — Plan of Care (Signed)
  Problem: Acute Rehab OT Goals (only OT should resolve) Goal: Pt. Will Perform Grooming Flowsheets (Taken 01/07/2023 0957) Pt Will Perform Grooming:  with supervision  sitting Goal: Pt. Will Perform Lower Body Bathing Flowsheets (Taken 01/07/2023 0957) Pt Will Perform Lower Body Bathing:  with mod assist  sitting/lateral leans Goal: Pt. Will Perform Upper Body Dressing Flowsheets (Taken 01/07/2023 0957) Pt Will Perform Upper Body Dressing:  with supervision  sitting Goal: Pt. Will Perform Lower Body Dressing Flowsheets (Taken 01/07/2023 0957) Pt Will Perform Lower Body Dressing:  with mod assist  sitting/lateral leans  with adaptive equipment Goal: Pt. Will Transfer To Toilet Flowsheets (Taken 01/07/2023 0957) Pt Will Transfer to Toilet:  with min guard assist  stand pivot transfer Goal: Pt. Will Perform Toileting-Clothing Manipulation Flowsheets (Taken 01/07/2023 0957) Pt Will Perform Toileting - Clothing Manipulation and hygiene:  with min guard assist  sitting/lateral leans Goal: Pt/Caregiver Will Perform Home Exercise Program Flowsheets (Taken 01/07/2023 0957) Pt/caregiver will Perform Home Exercise Program:  Increased ROM  Increased strength  Right Upper extremity  Left upper extremity  With minimal assist  Zaylynn Rickett OT, MOT

## 2023-01-08 ENCOUNTER — Observation Stay (HOSPITAL_COMMUNITY): Payer: Medicare Other

## 2023-01-08 DIAGNOSIS — E1122 Type 2 diabetes mellitus with diabetic chronic kidney disease: Secondary | ICD-10-CM | POA: Diagnosis not present

## 2023-01-08 DIAGNOSIS — R55 Syncope and collapse: Secondary | ICD-10-CM | POA: Diagnosis not present

## 2023-01-08 DIAGNOSIS — R296 Repeated falls: Secondary | ICD-10-CM | POA: Diagnosis not present

## 2023-01-08 DIAGNOSIS — E871 Hypo-osmolality and hyponatremia: Secondary | ICD-10-CM | POA: Diagnosis not present

## 2023-01-08 DIAGNOSIS — R2681 Unsteadiness on feet: Secondary | ICD-10-CM | POA: Diagnosis not present

## 2023-01-08 DIAGNOSIS — K5900 Constipation, unspecified: Secondary | ICD-10-CM | POA: Diagnosis not present

## 2023-01-08 DIAGNOSIS — Z1152 Encounter for screening for COVID-19: Secondary | ICD-10-CM | POA: Diagnosis not present

## 2023-01-08 DIAGNOSIS — N189 Chronic kidney disease, unspecified: Secondary | ICD-10-CM | POA: Diagnosis not present

## 2023-01-08 DIAGNOSIS — R531 Weakness: Secondary | ICD-10-CM | POA: Diagnosis not present

## 2023-01-08 DIAGNOSIS — Z79899 Other long term (current) drug therapy: Secondary | ICD-10-CM | POA: Diagnosis not present

## 2023-01-08 DIAGNOSIS — R2689 Other abnormalities of gait and mobility: Secondary | ICD-10-CM | POA: Diagnosis not present

## 2023-01-08 DIAGNOSIS — Z7982 Long term (current) use of aspirin: Secondary | ICD-10-CM | POA: Diagnosis not present

## 2023-01-08 DIAGNOSIS — Z8673 Personal history of transient ischemic attack (TIA), and cerebral infarction without residual deficits: Secondary | ICD-10-CM | POA: Diagnosis not present

## 2023-01-08 DIAGNOSIS — M6281 Muscle weakness (generalized): Secondary | ICD-10-CM | POA: Diagnosis not present

## 2023-01-08 DIAGNOSIS — I129 Hypertensive chronic kidney disease with stage 1 through stage 4 chronic kidney disease, or unspecified chronic kidney disease: Secondary | ICD-10-CM | POA: Diagnosis not present

## 2023-01-08 DIAGNOSIS — Z794 Long term (current) use of insulin: Secondary | ICD-10-CM | POA: Diagnosis not present

## 2023-01-08 DIAGNOSIS — Z7984 Long term (current) use of oral hypoglycemic drugs: Secondary | ICD-10-CM | POA: Diagnosis not present

## 2023-01-08 LAB — BASIC METABOLIC PANEL
Anion gap: 8 (ref 5–15)
BUN: 13 mg/dL (ref 8–23)
CO2: 24 mmol/L (ref 22–32)
Calcium: 8.3 mg/dL — ABNORMAL LOW (ref 8.9–10.3)
Chloride: 99 mmol/L (ref 98–111)
Creatinine, Ser: 0.81 mg/dL (ref 0.44–1.00)
GFR, Estimated: >60 mL/min (ref >60)
Glucose, Bld: 114 mg/dL — ABNORMAL HIGH (ref 70–99)
Potassium: 3.4 mmol/L — ABNORMAL LOW (ref 3.5–5.1)
Sodium: 131 mmol/L — ABNORMAL LOW (ref 135–145)

## 2023-01-08 LAB — RESP PANEL BY RT-PCR (RSV, FLU A&B, COVID)  RVPGX2
Influenza A by PCR: NEGATIVE
Influenza B by PCR: NEGATIVE
Resp Syncytial Virus by PCR: POSITIVE — AB
SARS Coronavirus 2 by RT PCR: NEGATIVE

## 2023-01-08 LAB — RENAL FUNCTION PANEL
Albumin: 3 g/dL — ABNORMAL LOW (ref 3.5–5.0)
Anion gap: 10 (ref 5–15)
BUN: 17 mg/dL (ref 8–23)
CO2: 22 mmol/L (ref 22–32)
Calcium: 8.6 mg/dL — ABNORMAL LOW (ref 8.9–10.3)
Chloride: 99 mmol/L (ref 98–111)
Creatinine, Ser: 0.82 mg/dL (ref 0.44–1.00)
GFR, Estimated: >60 mL/min (ref >60)
Glucose, Bld: 133 mg/dL — ABNORMAL HIGH (ref 70–99)
Phosphorus: 2.5 mg/dL (ref 2.5–4.6)
Potassium: 4.3 mmol/L (ref 3.5–5.1)
Sodium: 131 mmol/L — ABNORMAL LOW (ref 135–145)

## 2023-01-08 LAB — CBC
HCT: 29.5 % — ABNORMAL LOW (ref 36.0–46.0)
HCT: 32.9 % — ABNORMAL LOW (ref 36.0–46.0)
Hemoglobin: 9.7 g/dL — ABNORMAL LOW (ref 12.0–15.0)
Hemoglobin: 10.6 g/dL — ABNORMAL LOW (ref 12.0–15.0)
MCH: 30.8 pg (ref 26.0–34.0)
MCH: 30.5 pg (ref 26.0–34.0)
MCHC: 32.9 g/dL (ref 30.0–36.0)
MCHC: 32.2 g/dL (ref 30.0–36.0)
MCV: 93.7 fL (ref 80.0–100.0)
MCV: 94.8 fL (ref 80.0–100.0)
Platelets: 260 10*3/uL (ref 150–400)
Platelets: 291 10*3/uL (ref 150–400)
RBC: 3.15 MIL/uL — ABNORMAL LOW (ref 3.87–5.11)
RBC: 3.47 MIL/uL — ABNORMAL LOW (ref 3.87–5.11)
RDW: 14.2 % (ref 11.5–15.5)
RDW: 14.6 % (ref 11.5–15.5)
WBC: 9.5 10*3/uL (ref 4.0–10.5)
WBC: 13.5 10*3/uL — ABNORMAL HIGH (ref 4.0–10.5)
nRBC: 0 % (ref 0.0–0.2)
nRBC: 0 % (ref 0.0–0.2)

## 2023-01-08 LAB — GLUCOSE, CAPILLARY
Glucose-Capillary: 249 mg/dL — ABNORMAL HIGH (ref 70–99)
Glucose-Capillary: 168 mg/dL — ABNORMAL HIGH (ref 70–99)

## 2023-01-08 LAB — CORTISOL-AM, BLOOD: Cortisol - AM: 13.7 ug/dL (ref 6.7–22.6)

## 2023-01-08 LAB — LIPID PANEL
Cholesterol: 171 mg/dL (ref 0–200)
HDL: 71 mg/dL (ref >40)
LDL Cholesterol: 86 mg/dL (ref 0–99)
Total CHOL/HDL Ratio: 2.4 RATIO
Triglycerides: 69 mg/dL (ref <150)
VLDL: 14 mg/dL (ref 0–40)

## 2023-01-08 LAB — MAGNESIUM
Magnesium: 1.8 mg/dL (ref 1.7–2.4)
Magnesium: 1.9 mg/dL (ref 1.7–2.4)

## 2023-01-08 LAB — TROPONIN I (HIGH SENSITIVITY): Troponin I (High Sensitivity): 7 ng/L (ref <18)

## 2023-01-08 MED ORDER — POTASSIUM CHLORIDE CRYS ER 20 MEQ PO TBCR
40.0000 meq | EXTENDED_RELEASE_TABLET | Freq: Once | ORAL | Status: AC
  Administered 2023-01-08: 40 meq via ORAL
  Filled 2023-01-08: qty 2

## 2023-01-08 MED ORDER — POTASSIUM CHLORIDE CRYS ER 20 MEQ PO TBCR
40.0000 meq | EXTENDED_RELEASE_TABLET | Freq: Once | ORAL | Status: DC
  Filled 2023-01-08: qty 2

## 2023-01-08 MED ORDER — SODIUM CHLORIDE 1 G PO TABS: 1.0000 g | ORAL_TABLET | Freq: Three times a day (TID) | ORAL | 0 refills | Status: DC

## 2023-01-08 NOTE — Progress Notes (Addendum)
  PROGRESS NOTE  Patient was set to discharge to SNF this afternoon. Per RN, when patient was on ground floor ready to be discharged, she was slumped with decreased responsiveness. Patient was brought back to her room. Rapid response called and Dr. Denton Brick covering physician at bedside. Patient had tachycardia with elevated BP when she was first brought to the room. EKG and stat head CT were ordered. Patient returned back to baseline.   EKG reviewed independently which reveal NSR with PACs (which has been her baseline here) CT head negative for acute abnormality MRI brain ordered  Labs pending Check EEG  Discharge canceled today    Dessa Phi, DO [facility] 01/08/2023, 3:55 PM  Available via Epic secure chat 7am-7pm After these hours, please refer to coverage provider listed on [email or web address]

## 2023-01-08 NOTE — Discharge Summary (Signed)
Physician Discharge Summary  [name] QPR:[id number] DOB: 05-Feb-1930 DOA: 01/05/2023  PCP: Wanda Hilding, MD  Admit date: 01/05/2023 Discharge date: 01/08/2023  Admitted From: Home Disposition:  SNF   Recommendations for Outpatient Follow-up:  Follow up with PCP in 1 week Recommend discontinuing lisinopril Repeat BMP to check sodium level in 1 week.  Her baseline sodium ranges 128-131 Recommend outpatient palliative care and hospice on discharge.  Patient and healthcare power of attorney have voiced that patient does not desire to return back to the hospital and wishes to remain comfortable at home in case of further decompensations.  Discharge Condition: Stable, improved CODE STATUS: DNR Diet recommendation: Carb modified  Brief/Interim Summary: Wanda Shields is a 89 year old female who was admitted to [facility] 01/05/23. 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     Her last hospital stay was from 12/11/22 through 12/16/22 for generalized weakness, hyponatremia, constipation and a UTI.  She was discharged to [facility].   According to the daughter-in-law, Wanda Shields, who is her main caretaker, patient was still not doing well when she was discharged from the [facility] but was able to walk a little.  Since discharge, she has gotten weaker and she has been constipated despite eating very well.  The daughter-in-law is not sure if she received the as needed MiraLAX while she was at the SNF.  The patient has been sneezing but has not had any cough or shortness of breath.   In the ED: She was found to have RSV & a sodium of 123. Mg 1.5. She was started on 500 cc NS bolus, 500 cc lR bolus and IV normal saline @ 100 cc/hr.  Workup for hyponatremia was largely unremarkable.  Lab work inconsistent with SIADH.  TSH, total protein, cortisol,  triglycerides have all been within normal limits.  Lisinopril discontinued.  Sodium improved during hospital stay with IV fluid and sodium chloride tabs.   Discharge Diagnoses:   Principal Problem:   Generalized weakness Active Problems:   Hyponatremia   Type 2 diabetes mellitus with chronic kidney disease, with long-term current use of insulin (HCC)   History of CVA (cerebrovascular accident)   RSV (respiratory syncytial virus pneumonia)   Hypomagnesemia   Pressure injury of skin   Generalized weakness -PT OT recommending SNF placement   Acute on chronic hyponatremia -Baseline sodium level ranges 1 28-1 31 -Osmolality 281 -Urine sodium 69 --> stop lisinopril  -Urine osmol 243  --> not consistent with SIADH  -TSH normal 2.408  -Total protein low 5.4  -Morning cortisol level normal 13.7 -Triglyceride normal 69 -Improved   RSV -Overall asymptomatic   Diabetes mellitus -Semglee, Sliding scale insulin   History of CVA -Aspirin   Constipation -MiraLAX, Dulcolax suppository  Hypokalemia -Replace today   In agreement with assessment of the pressure ulcer as below:  Pressure Injury 01/06/23 Coccyx Medial Stage 3 -  Full thickness tissue loss. Subcutaneous fat may be visible but bone, tendon or muscle are NOT exposed. (Active)  01/06/23 0049  Location: Coccyx  Location Orientation: Medial  Staging: Stage 3 -  Full thickness tissue loss. Subcutaneous fat may be visible but bone, tendon or muscle are NOT exposed.  Wound Description (Comments):   Present on Admission: Yes  Dressing Type Foam - Lift dressing to assess site every shift 01/07/23 1000  Discharge Instructions  Discharge Instructions     Consult to wound, ostomy, continence   Complete by: As directed    Diet Carb Modified   Complete by: As directed    Discharge wound care:   Complete by: As directed    Increase activity slowly   Complete by: As directed       Allergies as of 01/08/2023        Reactions   Atropine    Demerol [meperidine Hcl]    Penicillins         Medication List     STOP taking these medications    glipiZIDE 5 MG 24 hr tablet Commonly known as: GLUCOTROL XL   lisinopril 5 MG tablet Commonly known as: ZESTRIL   UNABLE TO FIND       TAKE these medications    Acetaminophen 8 Hour 650 MG CR tablet Generic drug: acetaminophen Take 650 mg by mouth every 6 (six) hours. DX: Polyosteoarthritis, unspecified   acetaminophen 500 MG tablet Commonly known as: TYLENOL Take 1,000 mg by mouth every 6 (six) hours as needed.   alendronate 70 MG tablet Commonly known as: FOSAMAX Take 1 tablet (70 mg total) by mouth once a week. Sunday   Aspirin 81 MG Caps Take 1 capsule by mouth daily.   CALCIUM 600+D3 PO Take 1 tablet by mouth 2 (two) times daily.   celecoxib 200 MG capsule Commonly known as: CELEBREX Take 1 capsule (200 mg total) by mouth daily.   diclofenac Sodium 1 % Gel Commonly known as: Voltaren Arthritis Pain Apply 2 g topically 4 (four) times daily.   docusate sodium 100 MG capsule Commonly known as: COLACE Take 100-300 mg by mouth every evening.   HM Lidocaine Patch 4 % Generic drug: lidocaine Place 1 patch onto the skin every 12 (twelve) hours.   Lantus SoloStar 100 UNIT/ML Solostar Pen Generic drug: insulin glargine Inject 10 Units into the skin at bedtime.   metFORMIN 500 MG tablet Commonly known as: GLUCOPHAGE Take 1 tablet (500 mg total) by mouth daily with breakfast. What changed: Another medication with the same name was removed. Continue taking this medication, and follow the directions you see here.   Micro Guard 2 % powder Generic drug: miconazole Apply topically. apply powder under (R) breast fungal rash q shift x 14 days Every Shift   ondansetron 8 MG tablet Commonly known as: ZOFRAN Take 1 tablet (8 mg total) by mouth 3 (three) times daily as needed.   polyethylene glycol 17 g packet Commonly known as:  MIRALAX / GLYCOLAX Take 17 g by mouth daily as needed for mild constipation or moderate constipation.   PRESERVISION AREDS PO Take 1 tablet by mouth 2 (two) times daily.   sitaGLIPtin 25 MG tablet Commonly known as: JANUVIA Take 1 tablet (25 mg total) by mouth daily.   sodium chloride 1 g tablet Take 1 tablet (1 g total) by mouth 3 (three) times daily with meals. DX: Hypo-osmolality and hyponatremia What changed: when to take this   vitamin B-12 500 MCG tablet Commonly known as: CYANOCOBALAMIN Take 500 mcg by mouth daily.   Vitamin D3 10 MCG (400 UNIT) tablet Take 400 Units by mouth daily.               Discharge Care Instructions  (From admission, onward)           Start     Ordered   01/08/23 0000  Discharge wound care:  01/08/23 1011            Follow-up Information     Sasser, Silvestre Moment, MD Follow up.   Specialty: Family Medicine Contact information: [address] [place] [address] [phone]                Allergies  Allergen Reactions   Atropine    Demerol [Meperidine Hcl]    Penicillins       Procedures/Studies: CT ABDOMEN PELVIS W CONTRAST  Result Date: 01/05/2023 CLINICAL DATA:  Abdominal pain. EXAM: CT ABDOMEN AND PELVIS WITH CONTRAST TECHNIQUE: Multidetector CT imaging of the abdomen and pelvis was performed using the standard protocol following bolus administration of intravenous contrast. RADIATION DOSE REDUCTION: This exam was performed according to the departmental dose-optimization program which includes automated exposure control, adjustment of the mA and/or kV according to patient size and/or use of iterative reconstruction technique. CONTRAST:  29m OMNIPAQUE IOHEXOL 300 MG/ML  SOLN COMPARISON:  CT abdomen pelvis dated 06/01/2022. FINDINGS: Lower chest: Bibasilar linear atelectasis/scarring. The visualized lung bases are otherwise clear. There is coronary vascular calcification. No intra-abdominal free air or free fluid.  Hepatobiliary: The liver is unremarkable. Mild biliary dilatation, likely post cholecystectomy. No retained calcified stone noted in the central CBD. Pancreas: The pancreas is atrophic.  No active inflammatory changes. Spleen: Normal in size without focal abnormality. Adrenals/Urinary Tract: The adrenal glands are unremarkable. There is no hydronephrosis or nephrolithiasis on either side. Left renal upper pole 3 cm cyst. No imaging follow-up. There is extrarenal pelvis bilaterally with mild pelviectasis. The visualized ureters appear unremarkable. The urinary bladder is distended. Stomach/Bowel: There is sigmoid diverticulosis without active inflammatory changes. There is large amount of stool throughout the colon. There is no bowel obstruction or active inflammation. The appendix is not visualized with certainty. No inflammatory changes identified in the right lower quadrant. Vascular/Lymphatic: Moderate aortoiliac atherosclerotic disease. The IVC is unremarkable. No portal venous gas. There is no adenopathy. Reproductive: Hysterectomy. The right ovary is grossly of the. The left ovary is not visualized. Other: None Musculoskeletal: Osteopenia with degenerative changes of the spine. Several old healed left rib fractures. Lower lumbar fusion and fixation screws through the left femoral neck. Similar appearance of fragmentation of the left femoral head. No acute osseous pathology. IMPRESSION: 1. No acute intra-abdominal or pelvic pathology. 2. Sigmoid diverticulosis. 3. Constipation.  No bowel obstruction. 4.  Aortic Atherosclerosis (ICD10-I70.0). Electronically Signed   By: AAnner CreteM.D.   On: 01/05/2023 21:40   DG Chest Port 1 View  Result Date: 01/05/2023 CLINICAL DATA:  weakness EXAM: PORTABLE CHEST - 1 VIEW COMPARISON:  12/11/2022 FINDINGS: Cardiac silhouette is unremarkable. No pneumothorax or pleural effusion. The lungs are clear. Aorta is calcified. Old healed posterior left third through sixth  rib fractures noted. IMPRESSION: No acute cardiopulmonary process. Electronically Signed   By: JSammie BenchM.D.   On: 01/05/2023 16:30   DG HIP UNILAT WITH PELVIS 1V LEFT  Result Date: 12/15/2022 CLINICAL DATA:  Fall with LEFT hip pain. EXAM: DG HIP (WITH OR WITHOUT PELVIS) 3 V *L* COMPARISON:  07/12/2021 FINDINGS: No acute fracture or dislocation identified. Surgical hardware within the proximal LEFT femur and LOWER lumbar spine again noted. No suspicious focal bony lesions are identified. IMPRESSION: No evidence of acute bony abnormality. Electronically Signed   By: JMargarette CanadaM.D.   On: 12/15/2022 13:21   DG Knee 1-2 Views Left  Result Date: 12/15/2022 CLINICAL DATA:  Multiple falls recently. Left knee  pain, swelling, and bruising. EXAM: LEFT KNEE - 1-2 VIEW COMPARISON:  None Available. FINDINGS: There is diffuse decreased bone mineralization. Severe lateral compartment joint space narrowing and peripheral osteophytosis. Mild medial and lateral compartment chondrocalcinosis. Moderate superior and inferior patellar degenerative osteophytes. Small joint effusion. Mild chronic calcific density overlying the distal quadriceps tendon. No acute fracture is seen. No dislocation. There is moderate soft tissue swelling of the anterior superior knee at the level of the distal quadriceps tendon. Moderate vascular calcifications. IMPRESSION: 1. Moderate soft tissue swelling of the anterior superior knee at the level of the distal quadriceps tendon. No acute fracture is seen. 2. 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 Electronically Signed   By: Yvonne Kendall M.D.   On: 12/15/2022 13:19   DG Chest Portable 1 View  Result Date: 12/11/2022 CLINICAL DATA:  Generalized weakness, nausea, fall EXAM: PORTABLE CHEST 1 VIEW COMPARISON:  06/02/2022 FINDINGS: Cardiac size is within normal limits. Lung fields are clear of any pulmonary edema or new focal infiltrates. There is no  pleural effusion or pneumothorax. Old healed left rib fractures have not changed significantly. IMPRESSION: No active disease. Electronically Signed   By: Elmer Picker M.D.   On: 12/11/2022 13:52       Discharge Exam: Vitals:   01/07/23 2015 01/08/23 0342  BP: (!) 133/58 128/70  Pulse: 77 63  Resp: 20 18  Temp: 98.6 F (37 C) 98 F (36.7 C)  SpO2: 97% 96%    General: Pt is alert, awake, not in acute distress, HOH  Cardiovascular: S1/S2 + Respiratory: CTA bilaterally, no wheezing, no rhonchi, no respiratory distress Abdominal: Soft, NT, ND, bowel sounds + Psych: Normal mood and affect   The results of significant diagnostics from this hospitalization (including imaging, microbiology, ancillary and laboratory) are listed below for reference.     Microbiology: Recent Results (from the past 240 hour(s))  Resp panel by RT-PCR (RSV, Flu A&B, Covid) Anterior Nasal Swab     Status: Abnormal   Collection Time: 01/05/23  4:26 PM   Specimen: Anterior Nasal Swab  Result Value Ref Range Status   SARS Coronavirus 2 by RT PCR NEGATIVE NEGATIVE Final    Comment: (NOTE) SARS-CoV-2 target nucleic acids are NOT DETECTED.  The SARS-CoV-2 RNA is generally detectable in upper respiratory specimens during the acute phase of infection. The lowest concentration of SARS-CoV-2 viral copies this assay can detect is 138 copies/mL. A negative result does not preclude SARS-Cov-2 infection and should not be used as the sole basis for treatment or other patient management decisions. A negative result may occur with  improper specimen collection/handling, submission of specimen other than nasopharyngeal swab, presence of viral mutation(s) within the areas targeted by this assay, and inadequate number of viral copies(<138 copies/mL). A negative result must be combined with clinical observations, patient history, and epidemiological information. The expected result is Negative.  Fact Sheet for  Patients:  EntrepreneurPulse.com.au  Fact Sheet for Healthcare Providers:  IncredibleEmployment.be  This test is no t yet approved or cleared by the [place] FDA and  has been authorized for detection and/or diagnosis of SARS-CoV-2 by FDA under an Emergency Use Authorization (EUA). This EUA will remain  in effect (meaning this test can be used) for the duration of the COVID-19 declaration under Section 564(b)(1) of the Act, 21 U.S.C.section 360bbb-3(b)(1), unless the authorization is terminated  or revoked sooner.       Influenza A by PCR NEGATIVE NEGATIVE Final   Influenza B  by PCR NEGATIVE NEGATIVE Final    Comment: (NOTE) The Xpert Xpress SARS-CoV-2/FLU/RSV plus assay is intended as an aid in the diagnosis of influenza from Nasopharyngeal swab specimens and should not be used as a sole basis for treatment. Nasal washings and aspirates are unacceptable for Xpert Xpress SARS-CoV-2/FLU/RSV testing.  Fact Sheet for Patients: EntrepreneurPulse.com.au  Fact Sheet for Healthcare Providers: IncredibleEmployment.be  This test is not yet approved or cleared by the [place] FDA and has been authorized for detection and/or diagnosis of SARS-CoV-2 by FDA under an Emergency Use Authorization (EUA). This EUA will remain in effect (meaning this test can be used) for the duration of the COVID-19 declaration under Section 564(b)(1) of the Act, 21 U.S.C. section 360bbb-3(b)(1), unless the authorization is terminated or revoked.     Resp Syncytial Virus by PCR POSITIVE (A) NEGATIVE Final    Comment: (NOTE) Fact Sheet for Patients: EntrepreneurPulse.com.au  Fact Sheet for Healthcare Providers: IncredibleEmployment.be  This test is not yet approved or cleared by the [place] FDA and has been authorized for detection and/or diagnosis of SARS-CoV-2 by FDA under an  Emergency Use Authorization (EUA). This EUA will remain in effect (meaning this test can be used) for the duration of the COVID-19 declaration under Section 564(b)(1) of the Act, 21 U.S.C. section 360bbb-3(b)(1), unless the authorization is terminated or revoked.  Performed at [facility], [address] [place], [place] [address]   Urine Culture     Status: Abnormal   Collection Time: 01/05/23 11:01 PM   Specimen: Urine, Clean Catch  Result Value Ref Range Status   Specimen Description   Final    URINE, CLEAN CATCH Performed at [facility], [address] [place], [place] [address]    Special Requests   Final    NONE Performed at [facility], [address], [place],  [address]    Culture MULTIPLE SPECIES PRESENT, SUGGEST RECOLLECTION (A)  Final   Report Status 01/07/2023 FINAL  Final     Labs: BNP (last 3 results) No results for input(s): "BNP" in the last 8760 hours. Basic Metabolic Panel: Recent Labs  Lab 01/05/23 1616 01/05/23 1627 01/06/23 0355 01/06/23 0937 01/06/23 1848 01/07/23 0117 01/07/23 0909 01/08/23 0406  NA 123*   < > 128* 129* 128* 130* 130* 131*  K 4.9   < > 4.0 4.2 4.3 3.7 3.7 3.4*  CL 89*   < > 95* 94* 95* 97* 97* 99  CO2 23  --  '25 24 23 24 24 24  '$ GLUCOSE 179*   < > 165* 235* 169* 162* 188* 114*  BUN 24*   < > '17 15 20 18 16 13  '$ CREATININE 0.97   < > 0.79 0.83 0.92 0.88 0.92 0.81  CALCIUM 8.8*  --  8.7* 8.6* 8.0* 8.3* 8.6* 8.3*  MG 1.5*  --  1.7  --   --  1.7  --  1.8   < > = values in this interval not displayed.   Liver Function Tests: Recent Labs  Lab 01/05/23 1616 01/06/23 0355  AST 19 16  ALT 14 13  ALKPHOS 37* 33*  BILITOT 0.9 0.9  PROT 5.4* 5.0*  ALBUMIN 2.8* 2.6*   No results for input(s): "LIPASE", "AMYLASE" in the last 168 hours. No results for input(s): "AMMONIA" in the last 168 hours. CBC: Recent Labs  Lab 01/05/23 1616 01/05/23 1627 01/06/23 0355 01/07/23 0117 01/08/23 0406  WBC 9.0  --  7.3 14.1* 9.5   NEUTROABS  5.8  --  4.2  --   --   HGB 9.4* 10.2* 9.7* 9.7* 9.7*  HCT 28.5* 30.0* 29.3* 29.2* 29.5*  MCV 93.4  --  92.7 93.0 93.7  PLT 244  --  254 266 260   Cardiac Enzymes: Recent Labs  Lab 01/05/23 1616  CKTOTAL 112   BNP: Invalid input(s): "POCBNP" CBG: Recent Labs  Lab 01/06/23 1631 01/06/23 2047 01/07/23 0732 01/07/23 1105 01/07/23 2017  GLUCAP 131* 164* 123* 150* 190*   D-Dimer No results for input(s): "DDIMER" in the last 72 hours. Hgb A1c No results for input(s): "HGBA1C" in the last 72 hours. Lipid Profile Recent Labs    01/08/23 0406  CHOL 171  HDL 71  LDLCALC 86  TRIG 69  CHOLHDL 2.4   Thyroid function studies Recent Labs    01/06/23 0355  TSH 2.408   Anemia work up No results for input(s): "VITAMINB12", "FOLATE", "FERRITIN", "TIBC", "IRON", "RETICCTPCT" in the last 72 hours. Urinalysis    Component Value Date/Time   COLORURINE COLORLESS (A) 01/05/2023 2301   APPEARANCEUR CLEAR 01/05/2023 2301   LABSPEC 1.009 01/05/2023 2301   PHURINE 7.0 01/05/2023 2301   GLUCOSEU NEGATIVE 01/05/2023 2301   HGBUR NEGATIVE 01/05/2023 2301   BILIRUBINUR NEGATIVE 01/05/2023 2301   KETONESUR NEGATIVE 01/05/2023 2301   PROTEINUR NEGATIVE 01/05/2023 2301   NITRITE NEGATIVE 01/05/2023 2301   LEUKOCYTESUR NEGATIVE 01/05/2023 2301   Sepsis Labs Recent Labs  Lab 01/05/23 1616 01/06/23 0355 01/07/23 0117 01/08/23 0406  WBC 9.0 7.3 14.1* 9.5   Microbiology Recent Results (from the past 240 hour(s))  Resp panel by RT-PCR (RSV, Flu A&B, Covid) Anterior Nasal Swab     Status: Abnormal   Collection Time: 01/05/23  4:26 PM   Specimen: Anterior Nasal Swab  Result Value Ref Range Status   SARS Coronavirus 2 by RT PCR NEGATIVE NEGATIVE Final    Comment: (NOTE) SARS-CoV-2 target nucleic acids are NOT DETECTED.  The SARS-CoV-2 RNA is generally detectable in upper respiratory specimens during the acute phase of infection. The lowest concentration of SARS-CoV-2  viral copies this assay can detect is 138 copies/mL. A negative result does not preclude SARS-Cov-2 infection and should not be used as the sole basis for treatment or other patient management decisions. A negative result may occur with  improper specimen collection/handling, submission of specimen other than nasopharyngeal swab, presence of viral mutation(s) within the areas targeted by this assay, and inadequate number of viral copies(<138 copies/mL). A negative result must be combined with clinical observations, patient history, and epidemiological information. The expected result is Negative.  Fact Sheet for Patients:  EntrepreneurPulse.com.au  Fact Sheet for Healthcare Providers:  IncredibleEmployment.be  This test is no t yet approved or cleared by the [place] FDA and  has been authorized for detection and/or diagnosis of SARS-CoV-2 by FDA under an Emergency Use Authorization (EUA). This EUA will remain  in effect (meaning this test can be used) for the duration of the COVID-19 declaration under Section 564(b)(1) of the Act, 21 U.S.C.section 360bbb-3(b)(1), unless the authorization is terminated  or revoked sooner.       Influenza A by PCR NEGATIVE NEGATIVE Final   Influenza B by PCR NEGATIVE NEGATIVE Final    Comment: (NOTE) The Xpert Xpress SARS-CoV-2/FLU/RSV plus assay is intended as an aid in the diagnosis of influenza from Nasopharyngeal swab specimens and should not be used as a sole basis for treatment. Nasal washings and aspirates are unacceptable for Xpert Xpress SARS-CoV-2/FLU/RSV  testing.  Fact Sheet for Patients: EntrepreneurPulse.com.au  Fact Sheet for Healthcare Providers: IncredibleEmployment.be  This test is not yet approved or cleared by the [place] FDA and has been authorized for detection and/or diagnosis of SARS-CoV-2 by FDA under an Emergency Use Authorization  (EUA). This EUA will remain in effect (meaning this test can be used) for the duration of the COVID-19 declaration under Section 564(b)(1) of the Act, 21 U.S.C. section 360bbb-3(b)(1), unless the authorization is terminated or revoked.     Resp Syncytial Virus by PCR POSITIVE (A) NEGATIVE Final    Comment: (NOTE) Fact Sheet for Patients: EntrepreneurPulse.com.au  Fact Sheet for Healthcare Providers: IncredibleEmployment.be  This test is not yet approved or cleared by the [place] FDA and has been authorized for detection and/or diagnosis of SARS-CoV-2 by FDA under an Emergency Use Authorization (EUA). This EUA will remain in effect (meaning this test can be used) for the duration of the COVID-19 declaration under Section 564(b)(1) of the Act, 21 U.S.C. section 360bbb-3(b)(1), unless the authorization is terminated or revoked.  Performed at [facility], [address] [place], [place] [address]   Urine Culture     Status: Abnormal   Collection Time: 01/05/23 11:01 PM   Specimen: Urine, Clean Catch  Result Value Ref Range Status   Specimen Description   Final    URINE, CLEAN CATCH Performed at [facility], [address], [place], [place] [address]    Special Requests   Final    NONE Performed at [facility], [address] [place], [place] [address]    Culture MULTIPLE SPECIES PRESENT, SUGGEST RECOLLECTION (A)  Final   Report Status 01/07/2023 FINAL  Final     Patient was seen and examined on the day of discharge and was found to be in stable condition. Time coordinating discharge: 35 minutes including assessment and coordination of care, as well as examination of the patient.   SIGNED:  Dessa Phi, DO [facility] 01/08/2023, 10:11 AM

## 2023-01-08 NOTE — TOC Transition Note (Signed)
Transition of Care [facility]) - CM/SW Discharge Note   Patient Details  Name: Wanda Shields MRN: [id number] Date of Birth: 28-Aug-1930  Transition of Care [facility]) CM/SW Contact:  Shade Flood, LCSW Phone Number: 01/08/2023, 2:32 PM   Clinical Narrative:     Pt's SNF auth approved. [facility] can take pt today. DC clinical sent electronically. RN to call report.  No other TOC needs for dc.  Final next level of care: Skilled Nursing Facility Barriers to Discharge: Barriers Resolved   Patient Goals and CMS Choice CMS Medicare.gov Compare Post Acute Care list provided to:: Patient Choice offered to / list presented to : Patient  Discharge Placement                Patient chooses bed at: [facility] Patient to be transferred to facility by: w/c Name of family member notified: Manuela Schwartz Patient and family notified of of transfer: 01/08/23  Discharge Plan and Services Additional resources added to the After Visit Summary for   In-house Referral: Clinical Social Work   Post Acute Care Choice: [place]                               Social Determinants of Health ([place]) Interventions SDOH Screenings   Food Insecurity: No Food Insecurity (01/06/2023)  Recent Concern: [place] Present (12/12/2022)  Housing: Low Risk  (01/06/2023)  Transportation Needs: No Transportation Needs (01/06/2023)  Utilities: Not At Risk (01/06/2023)  Tobacco Use: Low Risk  (01/05/2023)     Readmission Risk Interventions     No data to display

## 2023-01-08 NOTE — Progress Notes (Signed)
Pt slept through the night. No c/o pain. Vitals stable. +1 edema assessed in bilateral lower extremities.

## 2023-01-08 NOTE — Progress Notes (Signed)
This nurse and nurse tech was transferring pt to the [facility] via wheelchair. When we reached the ground floor, this nurse looked at pt and noticed her head with slumped down with her chin to chest. This nurse asked pt if she was alright and she didn't respond. Pulled down her mask and her face was grey, eyes were gazed and she was drooling. This nurse asked pt again if she was okay and she she shook her head no and was experiencing aphasia. Right then Rapid response was called, immediately took patient back upstairs, continued to talk to pt and had her squeeze my hands on the way up to her room (while in the elevator.) When we reached her room vital signs were taken. BP was 135/113, pulse of 123 o2 97% on RA, pt was then able to answer all questions correctly and A&O x4. Assisted pt to the bed did a NIH and It was zero. Notified DO via phone. EKG obtained, CT of head ordered, notified daughter.

## 2023-01-09 ENCOUNTER — Other Ambulatory Visit (HOSPITAL_COMMUNITY): Payer: Self-pay | Admitting: *Deleted

## 2023-01-09 ENCOUNTER — Encounter (HOSPITAL_COMMUNITY): Payer: Self-pay | Admitting: Family Medicine

## 2023-01-09 ENCOUNTER — Observation Stay (HOSPITAL_COMMUNITY)
Admit: 2023-01-09 | Discharge: 2023-01-09 | Disposition: A | Discharge status: Home / Self Care | Payer: Medicare Other | Attending: Internal Medicine | Admitting: Internal Medicine

## 2023-01-09 ENCOUNTER — Observation Stay (HOSPITAL_BASED_OUTPATIENT_CLINIC_OR_DEPARTMENT_OTHER): Payer: Medicare Other

## 2023-01-09 DIAGNOSIS — R55 Syncope and collapse: Secondary | ICD-10-CM

## 2023-01-09 DIAGNOSIS — R404 Transient alteration of awareness: Secondary | ICD-10-CM | POA: Diagnosis not present

## 2023-01-09 DIAGNOSIS — E871 Hypo-osmolality and hyponatremia: Secondary | ICD-10-CM | POA: Diagnosis not present

## 2023-01-09 DIAGNOSIS — R531 Weakness: Secondary | ICD-10-CM | POA: Diagnosis not present

## 2023-01-09 DIAGNOSIS — R4182 Altered mental status, unspecified: Secondary | ICD-10-CM | POA: Diagnosis not present

## 2023-01-09 LAB — BASIC METABOLIC PANEL
Anion gap: 8 (ref 5–15)
BUN: 14 mg/dL (ref 8–23)
CO2: 22 mmol/L (ref 22–32)
Calcium: 8.3 mg/dL — ABNORMAL LOW (ref 8.9–10.3)
Chloride: 102 mmol/L (ref 98–111)
Creatinine, Ser: 0.72 mg/dL (ref 0.44–1.00)
GFR, Estimated: >60 mL/min (ref >60)
Glucose, Bld: 110 mg/dL — ABNORMAL HIGH (ref 70–99)
Potassium: 3.6 mmol/L (ref 3.5–5.1)
Sodium: 132 mmol/L — ABNORMAL LOW (ref 135–145)

## 2023-01-09 LAB — ECHOCARDIOGRAM COMPLETE
Area-P 1/2: 2.29 cm2
Height: 66 in
S' Lateral: 2.5 cm
Weight: 2246.93 oz

## 2023-01-09 LAB — GLUCOSE, CAPILLARY
Glucose-Capillary: 119 mg/dL — ABNORMAL HIGH (ref 70–99)
Glucose-Capillary: 227 mg/dL — ABNORMAL HIGH (ref 70–99)
Glucose-Capillary: 138 mg/dL — ABNORMAL HIGH (ref 70–99)
Glucose-Capillary: 189 mg/dL — ABNORMAL HIGH (ref 70–99)

## 2023-01-09 LAB — MAGNESIUM: Magnesium: 1.8 mg/dL (ref 1.7–2.4)

## 2023-01-09 LAB — VITAMIN B1: Vitamin B1 (Thiamine): 80.2 nmol/L (ref 66.5–200.0)

## 2023-01-09 MED ORDER — CYANOCOBALAMIN 1000 MCG/ML IJ SOLN
1000.0000 ug | Freq: Once | INTRAMUSCULAR | Status: AC
  Administered 2023-01-09: 1000 ug via INTRAMUSCULAR
  Filled 2023-01-09: qty 1

## 2023-01-09 NOTE — Progress Notes (Addendum)
PROGRESS NOTE    Wanda Shields  HYQ:[id number] DOB: [birth year] DOA: 01/05/2023 PCP: Wanda Hilding, MD     Brief Narrative:  Wanda Shields is a 89 year old female who was admitted to [facility] 01/05/23. 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     Her last hospital stay was from 12/11/22 through 12/16/22 for generalized weakness, hyponatremia, constipation and a UTI.  She was discharged to [facility].   According to the daughter-in-law, Wanda Shields, who is her main caretaker, patient was still not doing well when she was discharged from the [facility] but was able to walk a little.  Since discharge, she has gotten weaker and she has been constipated despite eating very well.  The daughter-in-law is not sure if she received the as needed MiraLAX while she was at the SNF.  The patient has been sneezing but has not had any cough or shortness of breath.   In the ED: She was found to have RSV & a sodium of 123. Mg 1.5. She was started on 500 cc NS bolus, 500 cc lR bolus and IV normal saline @ 100 cc/hr.   Workup for hyponatremia was largely unremarkable.  Lab work inconsistent with SIADH.  TSH, total protein, cortisol, triglycerides have all been within normal limits.  Lisinopril discontinued.  Sodium improved during hospital stay with IV fluid and sodium chloride tabs.   Patient was set to discharge to SNF 1/25. Per RN, when patient was on ground floor ready to be discharged, she was slumped with decreased responsiveness. Patient was brought back to her room. Rapid response called. EKG and stat head CT were ordered. Patient returned back to baseline.   New events last 24 hours / Subjective: Patient states she is not feeling great, but no specific symptoms. No CP, SOB, N/V.   Assessment & Plan:   Principal Problem:   Generalized weakness Active  Problems:   Hyponatremia   Type 2 diabetes mellitus with chronic kidney disease, with long-term current use of insulin (HCC)   History of CVA (cerebrovascular accident)   RSV (respiratory syncytial virus pneumonia)   Hypomagnesemia   Pressure injury of skin   Unresponsive episode -CT head negative, MRI brain negative -EEG pending -Echo pending -Cardiology consulted to eval for arrhythmic etiology  -Remains on tele   Generalized weakness -PT OT recommending SNF placement   Acute on chronic hyponatremia -Baseline sodium level ranges 1 28-1 31 -Osmolality 281 -Urine sodium 69 --> stop lisinopril  -Urine osmol 243  --> not consistent with SIADH  -TSH normal 2.408  -Total protein low 5.4  -Morning cortisol level normal 13.7 -Triglyceride normal 69 -Improved   RSV -Overall asymptomatic   Diabetes mellitus -Semglee, Sliding scale insulin   History of CVA -Aspirin   Constipation -MiraLAX, Dulcolax suppository     In agreement with assessment of the pressure ulcer as below:     Pressure Injury 01/06/23 Coccyx Medial Stage 3 -  Full thickness tissue loss. Subcutaneous fat may be visible but bone, tendon or muscle are NOT exposed. (Active)  01/06/23 0049  Location: Coccyx  Location Orientation: Medial  Staging: Stage 3 -  Full thickness tissue loss. Subcutaneous fat may be visible but bone, tendon or muscle are NOT exposed.  Wound Description (Comments):   Present on Admission: Yes  Dressing Type Foam - Lift dressing  to assess site every shift 01/07/23 1000         DVT prophylaxis:  heparin injection 5,000 Units Start: 01/06/23 0600 SCDs Start: 01/05/23 2324  Code Status: DNR Family Communication: No family at bedside. Called DIL without answer  Disposition Plan:  Status is: Observation The patient will require care spanning > 2 midnights and should be moved to inpatient because: Work up pending    Antimicrobials:  Anti-infectives (From admission, onward)     None        Objective: Vitals:   01/08/23 1500 01/08/23 1554 01/08/23 2034 01/09/23 0500  BP: (!) 135/113 115/62 114/63 130/80  Pulse: (!) 123 67 85 72  Resp:   20 18  Temp:   97.8 F (36.6 C) 98.7 F (37.1 C)  TempSrc:    Oral  SpO2:   96% 99%  Weight:      Height:        Intake/Output Summary (Last 24 hours) at 01/09/2023 1054 Last data filed at 01/09/2023 0300 Gross per 24 hour  Intake 340 ml  Output 500 ml  Net -160 ml    Filed Weights   01/05/23 1443 01/05/23 2324  Weight: 62 kg 63.7 kg    Examination:  General exam: Appears calm and comfortable, HOH  Respiratory system: Clear to auscultation. Respiratory effort normal. No respiratory distress. No conversational dyspnea.  Cardiovascular system: S1 & S2 heard, Irreg rhythm, PACs on tele. No murmurs. No pedal edema. Gastrointestinal system: Abdomen is nondistended, soft and nontender. Normal bowel sounds heard. Central nervous system: Alert and oriented. Non-focal, Moves all extremities  Extremities: Symmetric in appearance  Psychiatry: Judgement and insight appear normal. Mood & affect appropriate.   Data Reviewed: I have personally reviewed following labs and imaging studies  CBC: Recent Labs  Lab 01/05/23 1616 01/05/23 1627 01/06/23 0355 01/07/23 0117 01/08/23 0406 01/08/23 1528  WBC 9.0  --  7.3 14.1* 9.5 13.5*  NEUTROABS 5.8  --  4.2  --   --   --   HGB 9.4* 10.2* 9.7* 9.7* 9.7* 10.6*  HCT 28.5* 30.0* 29.3* 29.2* 29.5* 32.9*  MCV 93.4  --  92.7 93.0 93.7 94.8  PLT 244  --  254 266 260 008    Basic Metabolic Panel: Recent Labs  Lab 01/06/23 0355 01/06/23 0937 01/07/23 0117 01/07/23 0909 01/08/23 0406 01/08/23 1528 01/09/23 0404  NA 128*   < > 130* 130* 131* 131* 132*  K 4.0   < > 3.7 3.7 3.4* 4.3 3.6  CL 95*   < > 97* 97* 99 99 102  CO2 25   < > '24 24 24 22 22  '$ GLUCOSE 165*   < > 162* 188* 114* 133* 110*  BUN 17   < > '18 16 13 17 14  '$ CREATININE 0.79   < > 0.88 0.92 0.81 0.82 0.72   CALCIUM 8.7*   < > 8.3* 8.6* 8.3* 8.6* 8.3*  MG 1.7  --  1.7  --  1.8 1.9 1.8  PHOS  --   --   --   --   --  2.5  --    < > = values in this interval not displayed.    GFR: Estimated Creatinine Clearance: 43.8 mL/min (by C-G formula based on SCr of 0.72 mg/dL). Liver Function Tests: Recent Labs  Lab 01/05/23 1616 01/06/23 0355 01/08/23 1528  AST 19 16  --   ALT 14 13  --   ALKPHOS 37* 33*  --  BILITOT 0.9 0.9  --   PROT 5.4* 5.0*  --   ALBUMIN 2.8* 2.6* 3.0*    No results for input(s): "LIPASE", "AMYLASE" in the last 168 hours. No results for input(s): "AMMONIA" in the last 168 hours. Coagulation Profile: No results for input(s): "INR", "PROTIME" in the last 168 hours. Cardiac Enzymes: Recent Labs  Lab 01/05/23 1616  CKTOTAL 112    BNP (last 3 results) No results for input(s): "PROBNP" in the last 8760 hours. HbA1C: No results for input(s): "HGBA1C" in the last 72 hours. CBG: Recent Labs  Lab 01/07/23 1105 01/07/23 2017 01/08/23 1116 01/08/23 2037 01/09/23 0729  GLUCAP 150* 190* 249* 168* 119*    Lipid Profile: Recent Labs    01/08/23 0406  CHOL 171  HDL 71  LDLCALC 86  TRIG 69  CHOLHDL 2.4   Thyroid Function Tests: No results for input(s): "TSH", "T4TOTAL", "FREET4", "T3FREE", "THYROIDAB" in the last 72 hours.  Anemia Panel: No results for input(s): "VITAMINB12", "FOLATE", "FERRITIN", "TIBC", "IRON", "RETICCTPCT" in the last 72 hours. Sepsis Labs: No results for input(s): "PROCALCITON", "LATICACIDVEN" in the last 168 hours.  Recent Results (from the past 240 hour(s))  Resp panel by RT-PCR (RSV, Flu A&B, Covid) Anterior Nasal Swab     Status: Abnormal   Collection Time: 01/05/23  4:26 PM   Specimen: Anterior Nasal Swab  Result Value Ref Range Status   SARS Coronavirus 2 by RT PCR NEGATIVE NEGATIVE Final    Comment: (NOTE) SARS-CoV-2 target nucleic acids are NOT DETECTED.  The SARS-CoV-2 RNA is generally detectable in upper  respiratory specimens during the acute phase of infection. The lowest concentration of SARS-CoV-2 viral copies this assay can detect is 138 copies/mL. A negative result does not preclude SARS-Cov-2 infection and should not be used as the sole basis for treatment or other patient management decisions. A negative result may occur with  improper specimen collection/handling, submission of specimen other than nasopharyngeal swab, presence of viral mutation(s) within the areas targeted by this assay, and inadequate number of viral copies(<138 copies/mL). A negative result must be combined with clinical observations, patient history, and epidemiological information. The expected result is Negative.  Fact Sheet for Patients:  EntrepreneurPulse.com.au  Fact Sheet for Healthcare Providers:  IncredibleEmployment.be  This test is no t yet approved or cleared by the [place] FDA and  has been authorized for detection and/or diagnosis of SARS-CoV-2 by FDA under an Emergency Use Authorization (EUA). This EUA will remain  in effect (meaning this test can be used) for the duration of the COVID-19 declaration under Section 564(b)(1) of the Act, 21 U.S.C.section 360bbb-3(b)(1), unless the authorization is terminated  or revoked sooner.       Influenza A by PCR NEGATIVE NEGATIVE Final   Influenza B by PCR NEGATIVE NEGATIVE Final    Comment: (NOTE) The Xpert Xpress SARS-CoV-2/FLU/RSV plus assay is intended as an aid in the diagnosis of influenza from Nasopharyngeal swab specimens and should not be used as a sole basis for treatment. Nasal washings and aspirates are unacceptable for Xpert Xpress SARS-CoV-2/FLU/RSV testing.  Fact Sheet for Patients: EntrepreneurPulse.com.au  Fact Sheet for Healthcare Providers: IncredibleEmployment.be  This test is not yet approved or cleared by the [place] FDA and has been  authorized for detection and/or diagnosis of SARS-CoV-2 by FDA under an Emergency Use Authorization (EUA). This EUA will remain in effect (meaning this test can be used) for the duration of the COVID-19 declaration under Section 564(b)(1) of the Act, 21  U.S.C. section 360bbb-3(b)(1), unless the authorization is terminated or revoked.     Resp Syncytial Virus by PCR POSITIVE (A) NEGATIVE Final    Comment: (NOTE) Fact Sheet for Patients: EntrepreneurPulse.com.au  Fact Sheet for Healthcare Providers: IncredibleEmployment.be  This test is not yet approved or cleared by the [place] FDA and has been authorized for detection and/or diagnosis of SARS-CoV-2 by FDA under an Emergency Use Authorization (EUA). This EUA will remain in effect (meaning this test can be used) for the duration of the COVID-19 declaration under Section 564(b)(1) of the Act, 21 U.S.C. section 360bbb-3(b)(1), unless the authorization is terminated or revoked.  Performed at [facility], [address], [place], [place] [address]   Urine Culture     Status: Abnormal   Collection Time: 01/05/23 11:01 PM   Specimen: Urine, Clean Catch  Result Value Ref Range Status   Specimen Description   Final    URINE, CLEAN CATCH Performed at [facility], [address], [place], [place] [address]    Special Requests   Final    NONE Performed at [facility], [address], [place],  [address]    Culture MULTIPLE SPECIES PRESENT, SUGGEST RECOLLECTION (A)  Final   Report Status 01/07/2023 FINAL  Final  Resp panel by RT-PCR (RSV, Flu A&B, Covid) Anterior Nasal Swab     Status: Abnormal   Collection Time: 01/08/23 11:55 AM   Specimen: Anterior Nasal Swab  Result Value Ref Range Status   SARS Coronavirus 2 by RT PCR NEGATIVE NEGATIVE Final    Comment: (NOTE) SARS-CoV-2 target nucleic acids are NOT DETECTED.  The SARS-CoV-2 RNA is generally detectable in upper  respiratory specimens during the acute phase of infection. The lowest concentration of SARS-CoV-2 viral copies this assay can detect is 138 copies/mL. A negative result does not preclude SARS-Cov-2 infection and should not be used as the sole basis for treatment or other patient management decisions. A negative result may occur with  improper specimen collection/handling, submission of specimen other than nasopharyngeal swab, presence of viral mutation(s) within the areas targeted by this assay, and inadequate number of viral copies(<138 copies/mL). A negative result must be combined with clinical observations, patient history, and epidemiological information. The expected result is Negative.  Fact Sheet for Patients:  EntrepreneurPulse.com.au  Fact Sheet for Healthcare Providers:  IncredibleEmployment.be  This test is no t yet approved or cleared by the [place] FDA and  has been authorized for detection and/or diagnosis of SARS-CoV-2 by FDA under an Emergency Use Authorization (EUA). This EUA will remain  in effect (meaning this test can be used) for the duration of the COVID-19 declaration under Section 564(b)(1) of the Act, 21 U.S.C.section 360bbb-3(b)(1), unless the authorization is terminated  or revoked sooner.       Influenza A by PCR NEGATIVE NEGATIVE Final   Influenza B by PCR NEGATIVE NEGATIVE Final    Comment: (NOTE) The Xpert Xpress SARS-CoV-2/FLU/RSV plus assay is intended as an aid in the diagnosis of influenza from Nasopharyngeal swab specimens and should not be used as a sole basis for treatment. Nasal washings and aspirates are unacceptable for Xpert Xpress SARS-CoV-2/FLU/RSV testing.  Fact Sheet for Patients: EntrepreneurPulse.com.au  Fact Sheet for Healthcare Providers: IncredibleEmployment.be  This test is not yet approved or cleared by the [place] FDA and has been  authorized for detection and/or diagnosis of SARS-CoV-2 by FDA under an Emergency Use Authorization (EUA). This EUA will remain in effect (meaning this test can be used)  for the duration of the COVID-19 declaration under Section 564(b)(1) of the Act, 21 U.S.C. section 360bbb-3(b)(1), unless the authorization is terminated or revoked.     Resp Syncytial Virus by PCR POSITIVE (A) NEGATIVE Final    Comment: (NOTE) Fact Sheet for Patients: EntrepreneurPulse.com.au  Fact Sheet for Healthcare Providers: IncredibleEmployment.be  This test is not yet approved or cleared by the [place] FDA and has been authorized for detection and/or diagnosis of SARS-CoV-2 by FDA under an Emergency Use Authorization (EUA). This EUA will remain in effect (meaning this test can be used) for the duration of the COVID-19 declaration under Section 564(b)(1) of the Act, 21 U.S.C. section 360bbb-3(b)(1), unless the authorization is terminated or revoked.  Performed at [facility], [address] [place], [place] [address]       Radiology Studies: MR BRAIN WO CONTRAST  Result Date: 01/08/2023 CLINICAL DATA:  Mental status change, unknown cause EXAM: MRI HEAD WITHOUT CONTRAST TECHNIQUE: Multiplanar, multiecho pulse sequences of the brain and surrounding structures were obtained without intravenous contrast. COMPARISON:  CT head from the same day. FINDINGS: Brain: No convincing acute infarct. Punctate focus of DWI hyperintensity in the right cerebellum has no ADC correlate and is favored artifactual. Remote bilateral occipital infarcts, larger on the left. Additional scattered T2/FLAIR hyperintensity in the white matter, nonspecific but compatible with chronic microvascular ischemic disease. Vascular: Major arterial flow voids are maintained at the skull base. Skull and upper cervical spine: Normal marrow signal. Sinuses/Orbits: Clear sinuses.  No acute orbital findings. Other:  Small right mastoid effusion. IMPRESSION: 1. No evidence of acute intracranial abnormality. 2. Remote cerebellar and occipital infarcts. Electronically Signed   By: Margaretha Sheffield M.D.   On: 01/08/2023 18:37   CT HEAD WO CONTRAST (5MM)  Result Date: 01/08/2023 CLINICAL DATA:  Mental status change, unknown cause EXAM: CT HEAD WITHOUT CONTRAST TECHNIQUE: Contiguous axial images were obtained from the base of the skull through the vertex without intravenous contrast. RADIATION DOSE REDUCTION: This exam was performed according to the departmental dose-optimization program which includes automated exposure control, adjustment of the mA and/or kV according to patient size and/or use of iterative reconstruction technique. COMPARISON:  CT head June 02, 2022. FINDINGS: Brain: Chronic occipital and cerebellar infarcts. No evidence of new/interval acute large vascular territory infarct, acute hemorrhage, mass lesion or midline shift. No hydrocephalus. Vascular: Calcific atherosclerosis. Skull: No acute fracture. Sinuses/Orbits: Clear sinuses.  No acute orbital findings. Other: No mastoid few IMPRESSION: 1. No evidence of acute intracranial abnormality. 2. Chronic occipital and cerebellar infarcts. Electronically Signed   By: Margaretha Sheffield M.D.   On: 01/08/2023 16:05      Scheduled Meds:  aspirin EC  81 mg Oral Daily   diclofenac Sodium  2 g Topical QID   heparin  5,000 Units Subcutaneous Q8H   insulin aspart  0-15 Units Subcutaneous TID WC   insulin aspart  0-5 Units Subcutaneous QHS   insulin glargine-yfgn  10 Units Subcutaneous QHS   polyethylene glycol  17 g Oral Daily   sodium chloride  1 g Oral TID WC   Continuous Infusions:   LOS: 0 days   Time spent: 35 minutes   Dessa Phi, DO [facility] 01/09/2023, 10:54 AM   Available via Epic secure chat 7am-7pm After these hours, please refer to coverage provider listed on [email or web address]

## 2023-01-09 NOTE — Progress Notes (Signed)
*  PRELIMINARY RESULTS* Echocardiogram 2D Echocardiogram has been performed.  Wanda Shields 01/09/2023, 5:10 PM

## 2023-01-09 NOTE — Progress Notes (Signed)
Vitals stable, pt slept through the night with respirations even and unlabored. Pt had 3 small bowel movements during the night. Foam dressing changed on sacrum and left leg.

## 2023-01-09 NOTE — Consult Note (Signed)
Cardiology Consultation:   Patient ID: Wanda Shields; [id number]; 04-Mar-1930   Admit date: 01/05/2023 Date of Consult: 01/09/2023  Primary Care Provider: Manon Hilding, MD Primary Cardiologist: New to [place] Primary Electrophysiologist: None   Patient Profile:   Wanda Shields is a 89 y.o. female with a history of macular degeneration, type 2 diabetes mellitus, arthritis, previous stroke, and recent RSV who is being seen today for the evaluation of unresponsive episode at the request of Dr. Maylene Roes.  History of Present Illness:   Wanda Shields was hospitalized in late December 2023 through early January 2024 for management of generalized weakness with hyponatremia, constipation, and UTI.  She was subsequently discharged to the [facility] but ultimately became weaker after going home, sneezing but no cough, ultimately readmitted with RSV and hyponatremia, sodium down to 123.  She improved with saline infusion and supportive measures, workup for SIADH was unremarkable.  She was taken off lisinopril and discharged to go back to the [facility] on January 25.  While being taken there in a wheelchair she was noted to become unresponsive, chin down on chest noted per nursing, although was ultimately responsive without specific intervention and taken back to her room for further workup per rapid response team.  When vital signs were checked she was found to be hypertensive and tachycardic, cardiac monitor has shown sinus rhythm with PACs and chest very brief bursts of SVT.  She has had no pauses or VT.  High-sensitivity troponin I level was normal.  Echocardiogram was ordered and is pending at this time.  Of note, brain MRI was negative for acute stroke, shows old events.  She does recall the event from yesterday, states that she felt weak and "could not speak" when all of this occurred, it does not sound like she ever necessarily lost consciousness that she remembers being taken back to  her room.  Does not recall any chest pain or palpitations at the time.  Per review of the chart I see that she has had previous workup through other facilities.  Cardiac monitor through [place] in February 2023 showed no sustained atrial arrhythmias, no pauses, and rare PACs.  Subsequent ZIO monitor through [facility] cardiology in July 2023 showed frequent PACs and rare PVCs, but again no sustained arrhythmias or pauses.  There were short bursts of SVT noted.  Echocardiogram done at [facility] and June 2022 revealed normal LVEF.  Past Medical History:  Diagnosis Date   Arthritis    Carpal tunnel syndrome    Hyponatremia    Macular degeneration    RSV (respiratory syncytial virus infection)    Type 2 diabetes mellitus (HCC)    UTI (urinary tract infection)     Past Surgical History:  Procedure Laterality Date   ABDOMINAL HYSTERECTOMY     APPENDECTOMY     BACK SURGERY     CHOLECYSTECTOMY     HIP SURGERY     TONSILLECTOMY       Inpatient Medications: Scheduled Meds:  aspirin EC  81 mg Oral Daily   diclofenac Sodium  2 g Topical QID   heparin  5,000 Units Subcutaneous Q8H   insulin aspart  0-15 Units Subcutaneous TID WC   insulin aspart  0-5 Units Subcutaneous QHS   insulin glargine-yfgn  10 Units Subcutaneous QHS   polyethylene glycol  17 g Oral Daily   sodium chloride  1 g Oral TID WC    PRN Meds: acetaminophen **OR** acetaminophen, bisacodyl, hydrocortisone, liver  oil-zinc oxide, ondansetron **OR** ondansetron (ZOFRAN) IV  Allergies:    Allergies  Allergen Reactions   Atropine    Demerol [Meperidine Hcl]    Penicillins     Social History:   Social History   Socioeconomic History   Marital status: Widowed    Spouse name: Not on file   Number of children: Not on file   Years of education: Not on file   Highest education level: Not on file  Occupational History   Not on file  Tobacco Use   Smoking status: Never   Smokeless tobacco: Never  Vaping Use   Vaping Use:  Never used  Substance and Sexual Activity   Alcohol use: Never   Drug use: Never   Sexual activity: Not on file  Other Topics Concern   Not on file  Social History Narrative   Not on file   Social Determinants of Health   Financial Resource Strain: Not on file  Food Insecurity: No Food Insecurity (01/06/2023)   Hunger Vital Sign    Worried About Running Out of Food in the Last Year: Never true    Ran Out of Food in the Last Year: Never true  Recent Concern: Food Insecurity - Food Insecurity Present (12/12/2022)   Hunger Vital Sign    Worried About Running Out of Food in the Last Year: Never true    Jud in the Last Year: Sometimes true  Transportation Needs: No Transportation Needs (01/06/2023)   PRAPARE - Hydrologist (Medical): No    Lack of Transportation (Non-Medical): No  Physical Activity: Not on file  Stress: Not on file  Social Connections: Not on file  Intimate Partner Violence: Not At Risk (01/06/2023)   Humiliation, Afraid, Rape, and Kick questionnaire    Fear of Current or Ex-Partner: No    Emotionally Abused: No    Physically Abused: No    Sexually Abused: No    Family History:   Noncontributory.  ROS:  Please see the history of present illness.  All other ROS reviewed and negative.     Physical Exam/Data:   Vitals:   01/08/23 1500 01/08/23 1554 01/08/23 2034 01/09/23 0500  BP: (!) 135/113 115/62 114/63 130/80  Pulse: (!) 123 67 85 72  Resp:   20 18  Temp:   97.8 F (36.6 C) 98.7 F (37.1 C)  TempSrc:    Oral  SpO2:   96% 99%  Weight:      Height:        Intake/Output Summary (Last 24 hours) at 01/09/2023 1339 Last data filed at 01/09/2023 1025 Gross per 24 hour  Intake 340 ml  Output 500 ml  Net -160 ml   Filed Weights   01/05/23 1443 01/05/23 2324  Weight: 62 kg 63.7 kg   Body mass index is 22.67 kg/m.   Gen: Elderly woman.  Patient appears comfortable at rest. HEENT: Conjunctiva and lids normal,  oropharynx clear. Neck: Supple, no elevated JVP or carotid bruits, no thyromegaly. Lungs: Clear to auscultation, nonlabored breathing at rest. Cardiac: Regular rate and rhythm with ectopy, no S3, 2/6 systolic murmur, no pericardial rub. Abdomen: Soft, nontender, bowel sounds present. Extremities: No pitting edema, distal pulses 2+. Skin: Warm and dry. Musculoskeletal: No kyphosis. Neuropsychiatric: Alert and oriented x3, affect grossly appropriate.  Reports decreased visual acuity due to macular degeneration.  Hearing loss as well.  EKG:  An ECG dated 01/08/2023 was personally reviewed today and demonstrated:  Sinus rhythm with PACs, leftward axis rule out old inferior infarct pattern.  Telemetry:  I personally reviewed telemetry which shows sinus rhythm with PACs and brief bursts of SVT.  No pauses.  Relevant CV Studies:  Echocardiogram 06/07/2021 ([facility]): INTERPRETATION ---------------------------------------------------------------    NORMAL LEFT VENTRICULAR FUNCTION    NORMAL LA PRESSURES WITH DIASTOLIC DYSFUNCTION    NORMAL RIGHT VENTRICULAR SYSTOLIC FUNCTION    VALVULAR REGURGITATION: TRIVIAL AR, TRIVIAL MR, TRIVIAL TR    NO VALVULAR STENOSIS    SCLEROTIC AOV WITHOUT STENOSIS.    ANEURYSMAL LV APEX WITH PRESERVED EJECTION FRACTION.    NO PRIOR STUDY FOR COMPARISON   Event recorder 01/26/2022 ([place]): Sinus rhythm with heart rate ranging 54-115 bpm with average heart rate of 83 bpm   25,313 premature atrial contractions-represents less than 1% burden.   Rare premature ventricular contractions (72).   No atrial fibrillation or flutter seen.   No significant pauses or bradycardia arrhythmias.   Zio monitor 07/03/2022 (Dr. Candis Musa [facility] cardiology): Patient had a min HR of 48 bpm, max HR of 190 bpm, and avg HR of 75 bpm. Predominant underlying rhythm was Sinus Rhythm. 1085 Supraventricular Tachycardia runs occurred, the run with the fastest interval lasting 4 beats with a  max rate of 190 bpm, the longest lasting 20 beats with an avg rate of 117 bpm. Isolated SVEs were frequent (8.8%, [id number]), SVE Couplets were occasional (2.4%, 18433), and SVE Triplets were occasional (1.7%, 8700). Isolated VEs were rare (<1.0%), and no VE Couplets or VE Triplets were present. Final Interpretation Agree with Findings. Frequent PACs and short SV-runs present. No prolonged pauses   Laboratory Data:  Chemistry Recent Labs  Lab 01/08/23 0406 01/08/23 1528 01/09/23 0404  NA 131* 131* 132*  K 3.4* 4.3 3.6  CL 99 99 102  CO2 '24 22 22  '$ GLUCOSE 114* 133* 110*  BUN '13 17 14  '$ CREATININE 0.81 0.82 0.72  CALCIUM 8.3* 8.6* 8.3*  GFRNONAA >60 >60 >60  ANIONGAP '8 10 8    '$ Recent Labs  Lab 01/05/23 1616 01/06/23 0355 01/08/23 1528  PROT 5.4* 5.0*  --   ALBUMIN 2.8* 2.6* 3.0*  AST 19 16  --   ALT 14 13  --   ALKPHOS 37* 33*  --   BILITOT 0.9 0.9  --    Hematology Recent Labs  Lab 01/07/23 0117 01/08/23 0406 01/08/23 1528  WBC 14.1* 9.5 13.5*  RBC 3.14* 3.15* 3.47*  HGB 9.7* 9.7* 10.6*  HCT 29.2* 29.5* 32.9*  MCV 93.0 93.7 94.8  MCH 30.9 30.8 30.5  MCHC 33.2 32.9 32.2  RDW 14.1 14.2 14.6  PLT 266 260 291   Cardiac Enzymes Recent Labs  Lab 01/07/23 0406 01/08/23 1528  TROPONINIHS 7 7   Radiology/Studies:  MR BRAIN WO CONTRAST  Result Date: 01/08/2023 CLINICAL DATA:  Mental status change, unknown cause EXAM: MRI HEAD WITHOUT CONTRAST TECHNIQUE: Multiplanar, multiecho pulse sequences of the brain and surrounding structures were obtained without intravenous contrast. COMPARISON:  CT head from the same day. FINDINGS: Brain: No convincing acute infarct. Punctate focus of DWI hyperintensity in the right cerebellum has no ADC correlate and is favored artifactual. Remote bilateral occipital infarcts, larger on the left. Additional scattered T2/FLAIR hyperintensity in the white matter, nonspecific but compatible with chronic microvascular ischemic disease. Vascular:  Major arterial flow voids are maintained at the skull base. Skull and upper cervical spine: Normal marrow signal. Sinuses/Orbits: Clear sinuses.  No acute orbital findings. Other: Small right  mastoid effusion. IMPRESSION: 1. No evidence of acute intracranial abnormality. 2. Remote cerebellar and occipital infarcts. Electronically Signed   By: Margaretha Sheffield M.D.   On: 01/08/2023 18:37   CT HEAD WO CONTRAST (5MM)  Result Date: 01/08/2023 CLINICAL DATA:  Mental status change, unknown cause EXAM: CT HEAD WITHOUT CONTRAST TECHNIQUE: Contiguous axial images were obtained from the base of the skull through the vertex without intravenous contrast. RADIATION DOSE REDUCTION: This exam was performed according to the departmental dose-optimization program which includes automated exposure control, adjustment of the mA and/or kV according to patient size and/or use of iterative reconstruction technique. COMPARISON:  CT head June 02, 2022. FINDINGS: Brain: Chronic occipital and cerebellar infarcts. No evidence of new/interval acute large vascular territory infarct, acute hemorrhage, mass lesion or midline shift. No hydrocephalus. Vascular: Calcific atherosclerosis. Skull: No acute fracture. Sinuses/Orbits: Clear sinuses.  No acute orbital findings. Other: No mastoid few IMPRESSION: 1. No evidence of acute intracranial abnormality. 2. Chronic occipital and cerebellar infarcts. Electronically Signed   By: Margaretha Sheffield M.D.   On: 01/08/2023 16:05   CT ABDOMEN PELVIS W CONTRAST  Result Date: 01/05/2023 CLINICAL DATA:  Abdominal pain. EXAM: CT ABDOMEN AND PELVIS WITH CONTRAST TECHNIQUE: Multidetector CT imaging of the abdomen and pelvis was performed using the standard protocol following bolus administration of intravenous contrast. RADIATION DOSE REDUCTION: This exam was performed according to the departmental dose-optimization program which includes automated exposure control, adjustment of the mA and/or kV  according to patient size and/or use of iterative reconstruction technique. CONTRAST:  75m OMNIPAQUE IOHEXOL 300 MG/ML  SOLN COMPARISON:  CT abdomen pelvis dated 06/01/2022. FINDINGS: Lower chest: Bibasilar linear atelectasis/scarring. The visualized lung bases are otherwise clear. There is coronary vascular calcification. No intra-abdominal free air or free fluid. Hepatobiliary: The liver is unremarkable. Mild biliary dilatation, likely post cholecystectomy. No retained calcified stone noted in the central CBD. Pancreas: The pancreas is atrophic.  No active inflammatory changes. Spleen: Normal in size without focal abnormality. Adrenals/Urinary Tract: The adrenal glands are unremarkable. There is no hydronephrosis or nephrolithiasis on either side. Left renal upper pole 3 cm cyst. No imaging follow-up. There is extrarenal pelvis bilaterally with mild pelviectasis. The visualized ureters appear unremarkable. The urinary bladder is distended. Stomach/Bowel: There is sigmoid diverticulosis without active inflammatory changes. There is large amount of stool throughout the colon. There is no bowel obstruction or active inflammation. The appendix is not visualized with certainty. No inflammatory changes identified in the right lower quadrant. Vascular/Lymphatic: Moderate aortoiliac atherosclerotic disease. The IVC is unremarkable. No portal venous gas. There is no adenopathy. Reproductive: Hysterectomy. The right ovary is grossly of the. The left ovary is not visualized. Other: None Musculoskeletal: Osteopenia with degenerative changes of the spine. Several old healed left rib fractures. Lower lumbar fusion and fixation screws through the left femoral neck. Similar appearance of fragmentation of the left femoral head. No acute osseous pathology. IMPRESSION: 1. No acute intra-abdominal or pelvic pathology. 2. Sigmoid diverticulosis. 3. Constipation.  No bowel obstruction. 4.  Aortic Atherosclerosis (ICD10-I70.0).  Electronically Signed   By: AAnner CreteM.D.   On: 01/05/2023 21:40   DG Chest Port 1 View  Result Date: 01/05/2023 CLINICAL DATA:  weakness EXAM: PORTABLE CHEST - 1 VIEW COMPARISON:  12/11/2022 FINDINGS: Cardiac silhouette is unremarkable. No pneumothorax or pleural effusion. The lungs are clear. Aorta is calcified. Old healed posterior left third through sixth rib fractures noted. IMPRESSION: No acute cardiopulmonary process. Electronically Signed   By: JVonna Kotyk  Pleasure M.D.   On: 01/05/2023 16:30    Assessment and Plan:   Episode of transient unresponsiveness as discussed above, not clearly syncopal based on description.  Etiology is uncertain based on available information.  No obvious acute CNS event.  Presenting hyponatremia improved.  Cardiology consulted to assess for potential arrhythmogenic etiology although at this point there is no evidence for this.  She has worn 2 prior cardiac monitors in the last year in various situations that did show atrial ectopy in the form of PACs and brief bursts of SVT, but importantly no sustained arrhythmias or pauses.  Present telemetry also shows atrial ectopy, but no sustained arrhythmias or pauses at this point.  High-sensitivity troponin I level normal, she does not report any chest pain.  ECG does show leftward axis, rule out old inferior infarct pattern.  As of echocardiogram in 2022 at [facility] LVEF was normal without wall motion abnormalities.  Would observe on telemetry as you are.  Unless there are any concerning findings, specifically sustained arrhythmias or pauses/heart block, not entirely clear that further outpatient cardiac monitoring would be of use at this point in the absence of regular recurring symptomatology.  Agree with echocardiogram to ensure no change in LVEF.  Observe for other possible noncardiac etiologies as well.  Signed, Rozann Lesches, MD  01/09/2023 1:39 PM

## 2023-01-09 NOTE — TOC Progression Note (Signed)
Transition of Care [facility]) - Progression Note    Patient Details  Name: LE FERRAZ MRN: [id number] Date of Birth: 04/26/1930  Transition of Care [facility]) CM/SW Contact  Shade Flood, LCSW Phone Number: 01/09/2023, 9:59 AM  Clinical Narrative:     Pt's dc to [facility] was cancelled yesterday due to new symptoms. Medical workup in process today per MD who is anticipating possible dc tomorrow vs Sunday. Pt has SNF authorization good through 01/12/23.  Updated Kerri at [facility] who states that they can accept pt over the weekend. Pt will need an updated covid test prior to transfer. Will inform MD and weekend TOC.  Spoke with pt's DIL, Manuela Schwartz, to update. She remains in agreement with dc plan.  TOC will follow.  Expected Discharge Plan: [place] Barriers to Discharge: Barriers Resolved  Expected Discharge Plan and Services In-house Referral: Clinical Social Work   Post Acute Care Choice: [place] Living arrangements for the past 2 months: Single Family Home Expected Discharge Date: 01/08/23                                     Social Determinants of Health (SDOH) Interventions SDOH Screenings   Food Insecurity: No Food Insecurity (01/06/2023)  Recent Concern: [place] Present (12/12/2022)  Housing: Low Risk  (01/06/2023)  Transportation Needs: No Transportation Needs (01/06/2023)  Utilities: Not At Risk (01/06/2023)  Tobacco Use: Low Risk  (01/05/2023)    Readmission Risk Interventions     No data to display

## 2023-01-09 NOTE — Procedures (Signed)
Patient Name: Wanda Shields  MRN: [id number]  Epilepsy Attending: Lora Havens  Referring Physician/Provider: Dessa Phi, DO  Date: 01/09/2023 Duration: 22.30 mins  Patient history: 89 year old female with altered mental status.  EEG to evaluate for seizure.  Level of alertness: Awake, drowsy  AEDs during EEG study: None  Technical aspects: This EEG study was done with scalp electrodes positioned according to the 10-20 International system of electrode placement. Electrical activity was reviewed with band pass filter of 1-'70Hz'$ , sensitivity of 7 uV/mm, display speed of 62m/sec with a '60Hz'$  notched filter applied as appropriate. EEG data were recorded continuously and digitally stored.  Video monitoring was available and reviewed as appropriate.  Description: The posterior dominant rhythm consists of 8 Hz activity of moderate voltage (25-35 uV) seen predominantly in posterior head regions, symmetric and reactive to eye opening and eye closing. Drowsiness was characterized by attenuation of the posterior background rhythm. Hyperventilation and photic stimulation were not performed.     IMPRESSION: This study is within normal limits. No seizures or epileptiform discharges were seen throughout the recording.  Nazaret Chea OBarbra Sarks

## 2023-01-09 NOTE — Progress Notes (Signed)
EEG complete - results pending 

## 2023-01-10 DIAGNOSIS — M159 Polyosteoarthritis, unspecified: Secondary | ICD-10-CM | POA: Diagnosis not present

## 2023-01-10 DIAGNOSIS — E1122 Type 2 diabetes mellitus with diabetic chronic kidney disease: Secondary | ICD-10-CM | POA: Diagnosis not present

## 2023-01-10 DIAGNOSIS — I700 Atherosclerosis of aorta: Secondary | ICD-10-CM | POA: Diagnosis not present

## 2023-01-10 DIAGNOSIS — E1165 Type 2 diabetes mellitus with hyperglycemia: Secondary | ICD-10-CM | POA: Diagnosis not present

## 2023-01-10 DIAGNOSIS — N390 Urinary tract infection, site not specified: Secondary | ICD-10-CM | POA: Diagnosis not present

## 2023-01-10 DIAGNOSIS — Z7984 Long term (current) use of oral hypoglycemic drugs: Secondary | ICD-10-CM | POA: Diagnosis not present

## 2023-01-10 DIAGNOSIS — N189 Chronic kidney disease, unspecified: Secondary | ICD-10-CM | POA: Diagnosis not present

## 2023-01-10 DIAGNOSIS — E119 Type 2 diabetes mellitus without complications: Secondary | ICD-10-CM | POA: Diagnosis not present

## 2023-01-10 DIAGNOSIS — M810 Age-related osteoporosis without current pathological fracture: Secondary | ICD-10-CM | POA: Diagnosis not present

## 2023-01-10 DIAGNOSIS — Z743 Need for continuous supervision: Secondary | ICD-10-CM | POA: Diagnosis not present

## 2023-01-10 DIAGNOSIS — Z7982 Long term (current) use of aspirin: Secondary | ICD-10-CM | POA: Diagnosis not present

## 2023-01-10 DIAGNOSIS — Z1152 Encounter for screening for COVID-19: Secondary | ICD-10-CM | POA: Diagnosis not present

## 2023-01-10 DIAGNOSIS — D72829 Elevated white blood cell count, unspecified: Secondary | ICD-10-CM | POA: Diagnosis not present

## 2023-01-10 DIAGNOSIS — R627 Adult failure to thrive: Secondary | ICD-10-CM | POA: Diagnosis not present

## 2023-01-10 DIAGNOSIS — J121 Respiratory syncytial virus pneumonia: Secondary | ICD-10-CM | POA: Diagnosis not present

## 2023-01-10 DIAGNOSIS — R739 Hyperglycemia, unspecified: Secondary | ICD-10-CM | POA: Diagnosis not present

## 2023-01-10 DIAGNOSIS — K5900 Constipation, unspecified: Secondary | ICD-10-CM | POA: Diagnosis not present

## 2023-01-10 DIAGNOSIS — R296 Repeated falls: Secondary | ICD-10-CM | POA: Diagnosis not present

## 2023-01-10 DIAGNOSIS — E860 Dehydration: Secondary | ICD-10-CM | POA: Diagnosis not present

## 2023-01-10 DIAGNOSIS — N3000 Acute cystitis without hematuria: Secondary | ICD-10-CM | POA: Diagnosis not present

## 2023-01-10 DIAGNOSIS — M6281 Muscle weakness (generalized): Secondary | ICD-10-CM | POA: Diagnosis not present

## 2023-01-10 DIAGNOSIS — R1312 Dysphagia, oropharyngeal phase: Secondary | ICD-10-CM | POA: Diagnosis not present

## 2023-01-10 DIAGNOSIS — N1831 Chronic kidney disease, stage 3a: Secondary | ICD-10-CM | POA: Diagnosis not present

## 2023-01-10 DIAGNOSIS — R2681 Unsteadiness on feet: Secondary | ICD-10-CM | POA: Diagnosis not present

## 2023-01-10 DIAGNOSIS — M170 Bilateral primary osteoarthritis of knee: Secondary | ICD-10-CM | POA: Diagnosis not present

## 2023-01-10 DIAGNOSIS — R262 Difficulty in walking, not elsewhere classified: Secondary | ICD-10-CM | POA: Diagnosis not present

## 2023-01-10 DIAGNOSIS — R410 Disorientation, unspecified: Secondary | ICD-10-CM | POA: Diagnosis not present

## 2023-01-10 DIAGNOSIS — I129 Hypertensive chronic kidney disease with stage 1 through stage 4 chronic kidney disease, or unspecified chronic kidney disease: Secondary | ICD-10-CM | POA: Diagnosis not present

## 2023-01-10 DIAGNOSIS — R0902 Hypoxemia: Secondary | ICD-10-CM | POA: Diagnosis not present

## 2023-01-10 DIAGNOSIS — Z8673 Personal history of transient ischemic attack (TIA), and cerebral infarction without residual deficits: Secondary | ICD-10-CM | POA: Diagnosis not present

## 2023-01-10 DIAGNOSIS — R2689 Other abnormalities of gait and mobility: Secondary | ICD-10-CM | POA: Diagnosis not present

## 2023-01-10 DIAGNOSIS — R6889 Other general symptoms and signs: Secondary | ICD-10-CM | POA: Diagnosis not present

## 2023-01-10 DIAGNOSIS — E871 Hypo-osmolality and hyponatremia: Secondary | ICD-10-CM | POA: Diagnosis not present

## 2023-01-10 DIAGNOSIS — R531 Weakness: Secondary | ICD-10-CM | POA: Diagnosis not present

## 2023-01-10 DIAGNOSIS — R55 Syncope and collapse: Secondary | ICD-10-CM | POA: Diagnosis not present

## 2023-01-10 DIAGNOSIS — Z79899 Other long term (current) drug therapy: Secondary | ICD-10-CM | POA: Diagnosis not present

## 2023-01-10 DIAGNOSIS — Z741 Need for assistance with personal care: Secondary | ICD-10-CM | POA: Diagnosis not present

## 2023-01-10 DIAGNOSIS — R944 Abnormal results of kidney function studies: Secondary | ICD-10-CM | POA: Diagnosis not present

## 2023-01-10 DIAGNOSIS — D649 Anemia, unspecified: Secondary | ICD-10-CM | POA: Diagnosis not present

## 2023-01-10 DIAGNOSIS — Z794 Long term (current) use of insulin: Secondary | ICD-10-CM | POA: Diagnosis not present

## 2023-01-10 DIAGNOSIS — Z9181 History of falling: Secondary | ICD-10-CM | POA: Diagnosis not present

## 2023-01-10 DIAGNOSIS — R4182 Altered mental status, unspecified: Secondary | ICD-10-CM | POA: Diagnosis present

## 2023-01-10 DIAGNOSIS — R488 Other symbolic dysfunctions: Secondary | ICD-10-CM | POA: Diagnosis not present

## 2023-01-10 DIAGNOSIS — E1159 Type 2 diabetes mellitus with other circulatory complications: Secondary | ICD-10-CM | POA: Diagnosis not present

## 2023-01-10 DIAGNOSIS — I152 Hypertension secondary to endocrine disorders: Secondary | ICD-10-CM | POA: Diagnosis not present

## 2023-01-10 DIAGNOSIS — H3530 Unspecified macular degeneration: Secondary | ICD-10-CM | POA: Diagnosis not present

## 2023-01-10 LAB — BASIC METABOLIC PANEL
Anion gap: 7 (ref 5–15)
BUN: 15 mg/dL (ref 8–23)
CO2: 24 mmol/L (ref 22–32)
Calcium: 8 mg/dL — ABNORMAL LOW (ref 8.9–10.3)
Chloride: 100 mmol/L (ref 98–111)
Creatinine, Ser: 0.78 mg/dL (ref 0.44–1.00)
GFR, Estimated: >60 mL/min (ref >60)
Glucose, Bld: 145 mg/dL — ABNORMAL HIGH (ref 70–99)
Potassium: 3.6 mmol/L (ref 3.5–5.1)
Sodium: 131 mmol/L — ABNORMAL LOW (ref 135–145)

## 2023-01-10 LAB — RESP PANEL BY RT-PCR (RSV, FLU A&B, COVID)  RVPGX2
Influenza A by PCR: NEGATIVE
Influenza B by PCR: NEGATIVE
Resp Syncytial Virus by PCR: POSITIVE — AB
SARS Coronavirus 2 by RT PCR: NEGATIVE

## 2023-01-10 LAB — CBC
HCT: 29.8 % — ABNORMAL LOW (ref 36.0–46.0)
Hemoglobin: 9.7 g/dL — ABNORMAL LOW (ref 12.0–15.0)
MCH: 30.6 pg (ref 26.0–34.0)
MCHC: 32.6 g/dL (ref 30.0–36.0)
MCV: 94 fL (ref 80.0–100.0)
Platelets: 247 10*3/uL (ref 150–400)
RBC: 3.17 MIL/uL — ABNORMAL LOW (ref 3.87–5.11)
RDW: 14.4 % (ref 11.5–15.5)
WBC: 7.7 10*3/uL (ref 4.0–10.5)
nRBC: 0 % (ref 0.0–0.2)

## 2023-01-10 LAB — GLUCOSE, CAPILLARY
Glucose-Capillary: 155 mg/dL — ABNORMAL HIGH (ref 70–99)
Glucose-Capillary: 193 mg/dL — ABNORMAL HIGH (ref 70–99)

## 2023-01-10 LAB — MAGNESIUM: Magnesium: 1.8 mg/dL (ref 1.7–2.4)

## 2023-01-10 NOTE — Discharge Summary (Signed)
Physician Discharge Summary  MILLIANI HERRADA MHD:[id number] DOB: 04-24-1930 DOA: 01/05/2023  PCP: Manon Hilding, MD  Admit date: 01/05/2023 Discharge date: 01/10/2023  Admitted From: Home Disposition:  SNF   Recommendations for Outpatient Follow-up:  Follow up with PCP in 1 week Recommend discontinuing lisinopril Repeat BMP to check sodium level in 1 week.  Her baseline sodium ranges 128-131 Recommend outpatient palliative care and hospice on discharge.  Patient and healthcare power of attorney have voiced that patient does not desire to return back to the hospital and wishes to remain comfortable at home in case of further decompensations.  Discharge Condition: Stable, improved CODE STATUS: DNR Diet recommendation: Carb modified  Brief/Interim Summary: BRISEYDA FEHR is a 89 year old female who was admitted to [facility] 01/05/23. 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     Her last hospital stay was from 12/11/22 through 12/16/22 for generalized weakness, hyponatremia, constipation and a UTI.  She was discharged to [facility].   According to the daughter-in-law, Manuela Schwartz, who is her main caretaker, patient was still not doing well when she was discharged from the [facility] but was able to walk a little.  Since discharge, she has gotten weaker and she has been constipated despite eating very well.  The daughter-in-law is not sure if she received the as needed MiraLAX while she was at the SNF.  The patient has been sneezing but has not had any cough or shortness of breath.   In the ED: She was found to have RSV & a sodium of 123. Mg 1.5. She was started on 500 cc NS bolus, 500 cc lR bolus and IV normal saline @ 100 cc/hr.  Workup for hyponatremia was largely unremarkable.  Lab work inconsistent with SIADH.  TSH, total protein, cortisol,  triglycerides have all been within normal limits.  Lisinopril discontinued.  Sodium improved during hospital stay with IV fluid and sodium chloride tabs.   Patient was set to discharge to SNF 1/25. Per RN, when patient was on ground floor ready to be discharged, she was slumped with decreased responsiveness. Patient was brought back to her room. Rapid response called. EKG and stat head CT were ordered. Patient returned back to baseline.    EKG showed NSR with PACs. CT head, MRI brain were negative for acute abnormalities. EEG negative for epileptiform discharges. Echo stable. Patient had no further episodes and was discharged to SNF in stable condition.   Discharge Diagnoses:   Principal Problem:   Generalized weakness Active Problems:   Hyponatremia   Type 2 diabetes mellitus with chronic kidney disease, with long-term current use of insulin (HCC)   History of CVA (cerebrovascular accident)   RSV (respiratory syncytial virus pneumonia)   Hypomagnesemia   Pressure injury of skin   Unresponsive episode -CT head negative, MRI brain negative -EEG negative  -Echo EF 65-70%, no aortic stenosis, grade 1 dd  -Cardiology consulted to eval for arrhythmic etiology -- Patient did have cardiac monitor outpatient through [facility] in 01/2022 as well as ZIO monitor through [facility] 06/2022. In setting of no sustained arrhythmias or pauses/blocks and did not feel she needed further outpatient cardiac monitoring in absence of regular recurring symptomatology.   Generalized weakness -PT OT recommending SNF placement   Acute on chronic hyponatremia -Baseline sodium level ranges 1 28-1 31 -Osmolality 281 -Urine sodium 69 -->  stop lisinopril  -Urine osmol 243  --> not consistent with SIADH  -TSH normal 2.408  -Total protein low 5.4  -Morning cortisol level normal 13.7 -Triglyceride normal 69 -Improved and stable    RSV -Overall asymptomatic   Diabetes mellitus -Semglee, Sliding scale insulin   History  of CVA -Aspirin   Constipation -MiraLAX, Dulcolax suppository     In agreement with assessment of the pressure ulcer as below:  Pressure Injury 01/06/23 Coccyx Medial Stage 3 -  Full thickness tissue loss. Subcutaneous fat may be visible but bone, tendon or muscle are NOT exposed. (Active)  01/06/23 0049  Location: Coccyx  Location Orientation: Medial  Staging: Stage 3 -  Full thickness tissue loss. Subcutaneous fat may be visible but bone, tendon or muscle are NOT exposed.  Wound Description (Comments):   Present on Admission: Yes  Dressing Type Foam - Lift dressing to assess site every shift 01/07/23 1000       Discharge Instructions  Discharge Instructions     Consult to wound, ostomy, continence   Complete by: As directed    Diet Carb Modified   Complete by: As directed    Discharge wound care:   Complete by: As directed    Increase activity slowly   Complete by: As directed       Allergies as of 01/10/2023       Reactions   Atropine    Demerol [meperidine Hcl]    Penicillins         Medication List     STOP taking these medications    glipiZIDE 5 MG 24 hr tablet Commonly known as: GLUCOTROL XL   lisinopril 5 MG tablet Commonly known as: ZESTRIL   UNABLE TO FIND       TAKE these medications    Acetaminophen 8 Hour 650 MG CR tablet Generic drug: acetaminophen Take 650 mg by mouth every 6 (six) hours. DX: Polyosteoarthritis, unspecified   acetaminophen 500 MG tablet Commonly known as: TYLENOL Take 1,000 mg by mouth every 6 (six) hours as needed.   alendronate 70 MG tablet Commonly known as: FOSAMAX Take 1 tablet (70 mg total) by mouth once a week. Sunday   Aspirin 81 MG Caps Take 1 capsule by mouth daily.   CALCIUM 600+D3 PO Take 1 tablet by mouth 2 (two) times daily.   celecoxib 200 MG capsule Commonly known as: CELEBREX Take 1 capsule (200 mg total) by mouth daily.   diclofenac Sodium 1 % Gel Commonly known as: Voltaren  Arthritis Pain Apply 2 g topically 4 (four) times daily.   docusate sodium 100 MG capsule Commonly known as: COLACE Take 100-300 mg by mouth every evening.   HM Lidocaine Patch 4 % Generic drug: lidocaine Place 1 patch onto the skin every 12 (twelve) hours.   Lantus SoloStar 100 UNIT/ML Solostar Pen Generic drug: insulin glargine Inject 10 Units into the skin at bedtime.   metFORMIN 500 MG tablet Commonly known as: GLUCOPHAGE Take 1 tablet (500 mg total) by mouth daily with breakfast. What changed: Another medication with the same name was removed. Continue taking this medication, and follow the directions you see here.   Micro Guard 2 % powder Generic drug: miconazole Apply topically. apply powder under (R) breast fungal rash q shift x 14 days Every Shift   ondansetron 8 MG tablet Commonly known as: ZOFRAN Take 1 tablet (8 mg total) by mouth 3 (three) times daily as needed.   polyethylene glycol 17 g packet Commonly  known as: MIRALAX / GLYCOLAX Take 17 g by mouth daily as needed for mild constipation or moderate constipation.   PRESERVISION AREDS PO Take 1 tablet by mouth 2 (two) times daily.   sitaGLIPtin 25 MG tablet Commonly known as: JANUVIA Take 1 tablet (25 mg total) by mouth daily.   sodium chloride 1 g tablet Take 1 tablet (1 g total) by mouth 3 (three) times daily with meals. DX: Hypo-osmolality and hyponatremia What changed: when to take this   vitamin B-12 500 MCG tablet Commonly known as: CYANOCOBALAMIN Take 500 mcg by mouth daily.   Vitamin D3 10 MCG (400 UNIT) tablet Take 400 Units by mouth daily.               Discharge Care Instructions  (From admission, onward)           Start     Ordered   01/08/23 0000  Discharge wound care:        01/08/23 1011            Contact information for follow-up providers     Sasser, Silvestre Moment, MD Follow up.   Specialty: Family Medicine Contact information: [place]  [address] [phone]              Contact information for after-discharge care     [facility] .   Service: Skilled Nursing Contact information: [address] [address] [phone]                    Allergies  Allergen Reactions   Atropine    Demerol [Meperidine Hcl]    Penicillins       Procedures/Studies: ECHOCARDIOGRAM COMPLETE  Result Date: 01/09/2023    ECHOCARDIOGRAM REPORT   Patient Name:   MAHAYLA HADDAWAY Date of Exam: 01/09/2023 Medical Rec #:  [id number]      Height:       66.0 in Accession #:    [id number]     Weight:       140.4 lb Date of Birth:  [birth year]      BSA:          1.721 m Patient Age:    89 years       BP:           129/70 mmHg Patient Gender: F              HR:           73 bpm. Exam Location:  [facility] Procedure: 2D Echo, Cardiac Doppler and Color Doppler Indications:    R55 Syncope  History:        Patient has no prior history of Echocardiogram examinations.                 Stroke; Risk Factors:Hypertension and Diabetes. RSV (respiratory                 syncytial virus pneumonia) this admit.  Sonographer:    Alvino Chapel RCS Referring Phys: [id number] Rico  1. Left ventricular ejection fraction, by estimation, is 65 to 70%. The left ventricle has normal function. The left ventricle has no regional wall motion abnormalities. Left ventricular diastolic parameters are consistent with Grade I diastolic dysfunction (impaired relaxation).  2. Right ventricular systolic function is normal. The right ventricular size is normal. There is normal pulmonary artery systolic pressure. The estimated right  ventricular systolic pressure is 37.9 mmHg.  3. The mitral valve is mildly degenerative. Mild mitral valve regurgitation.  4. The aortic valve is tricuspid. There is moderate calcification of the aortic valve. Aortic valve regurgitation is trivial. Aortic valve  sclerosis/calcification is present, without any evidence of aortic stenosis.  5. Aortic dilatation noted. There is mild dilatation of the aortic root, measuring 40 mm.  6. The inferior vena cava is normal in size with greater than 50% respiratory variability, suggesting right atrial pressure of 3 mmHg. Comparison(s): No prior Echocardiogram. FINDINGS  Left Ventricle: Left ventricular ejection fraction, by estimation, is 65 to 70%. The left ventricle has normal function. The left ventricle has no regional wall motion abnormalities. The left ventricular internal cavity size was normal in size. There is  no left ventricular hypertrophy. Left ventricular diastolic parameters are consistent with Grade I diastolic dysfunction (impaired relaxation). Right Ventricle: The right ventricular size is normal. No increase in right ventricular wall thickness. Right ventricular systolic function is normal. There is normal pulmonary artery systolic pressure. The tricuspid regurgitant velocity is 2.52 m/s, and  with an assumed right atrial pressure of 3 mmHg, the estimated right ventricular systolic pressure is 02.4 mmHg. Left Atrium: Left atrial size was normal in size. Right Atrium: Right atrial size was normal in size. Pericardium: There is no evidence of pericardial effusion. Mitral Valve: The mitral valve is degenerative in appearance. There is mild calcification of the mitral valve leaflet(s). Mild mitral annular calcification. Mild mitral valve regurgitation. Tricuspid Valve: The tricuspid valve is grossly normal. Tricuspid valve regurgitation is mild. Aortic Valve: The aortic valve is tricuspid. There is moderate calcification of the aortic valve. There is mild aortic valve annular calcification. Aortic valve regurgitation is trivial. Aortic valve sclerosis/calcification is present, without any evidence of aortic stenosis. Pulmonic Valve: The pulmonic valve was grossly normal. Pulmonic valve regurgitation is trivial. Aorta:  Aortic dilatation noted. There is mild dilatation of the aortic root, measuring 40 mm. Venous: The inferior vena cava is normal in size with greater than 50% respiratory variability, suggesting right atrial pressure of 3 mmHg. IAS/Shunts: There is redundancy of the interatrial septum. No atrial level shunt detected by color flow Doppler.  LEFT VENTRICLE PLAX 2D LVIDd:         4.10 cm   Diastology LVIDs:         2.50 cm   LV e' medial:    5.00 cm/s LV PW:         0.90 cm   LV E/e' medial:  16.5 LV IVS:        0.90 cm   LV e' lateral:   7.62 cm/s LVOT diam:     1.90 cm   LV E/e' lateral: 10.8 LV SV:         69 LV SV Index:   40 LVOT Area:     2.84 cm  RIGHT VENTRICLE RV S prime:     11.60 cm/s TAPSE (M-mode): 2.0 cm LEFT ATRIUM             Index        RIGHT ATRIUM           Index LA diam:        2.90 cm 1.69 cm/m   RA Area:     20.50 cm LA Vol (A2C):   45.5 ml 26.44 ml/m  RA Volume:   49.90 ml  29.00 ml/m LA Vol (A4C):   37.2 ml 21.62 ml/m LA Biplane  Vol: 42.9 ml 24.93 ml/m  AORTIC VALVE LVOT Vmax:   103.00 cm/s LVOT Vmean:  60.000 cm/s LVOT VTI:    0.242 m  AORTA Ao Root diam: 3.70 cm MITRAL VALVE                TRICUSPID VALVE MV Area (PHT): 2.29 cm     TR Peak grad:   25.4 mmHg MV Decel Time: 331 msec     TR Vmax:        252.00 cm/s MV E velocity: 82.60 cm/s MV A velocity: 143.00 cm/s  SHUNTS MV E/A ratio:  0.58         Systemic VTI:  0.24 m                             Systemic Diam: 1.90 cm Rozann Lesches MD Electronically signed by Rozann Lesches MD Signature Date/Time: 01/09/2023/5:25:16 PM    Final    EEG adult  Result Date: 01/09/2023 Lora Havens, MD     01/09/2023  2:59 PM Patient Name: SAMARI BITTINGER MRN: [id number] Epilepsy Attending: Lora Havens Referring Physician/Provider: Dessa Phi, DO Date: 01/09/2023 Duration: 22.30 mins Patient history: 89 year old female with altered mental status.  EEG to evaluate for seizure. Level of alertness: Awake, drowsy AEDs during EEG study: None  Technical aspects: This EEG study was done with scalp electrodes positioned according to the 10-20 International system of electrode placement. Electrical activity was reviewed with band pass filter of 1-'70Hz'$ , sensitivity of 7 uV/mm, display speed of 23m/sec with a '60Hz'$  notched filter applied as appropriate. EEG data were recorded continuously and digitally stored.  Video monitoring was available and reviewed as appropriate. Description: The posterior dominant rhythm consists of 8 Hz activity of moderate voltage (25-35 uV) seen predominantly in posterior head regions, symmetric and reactive to eye opening and eye closing. Drowsiness was characterized by attenuation of the posterior background rhythm. Hyperventilation and photic stimulation were not performed.   IMPRESSION: This study is within normal limits. No seizures or epileptiform discharges were seen throughout the recording. PLora Havens  MR BRAIN WO CONTRAST  Result Date: 01/08/2023 CLINICAL DATA:  Mental status change, unknown cause EXAM: MRI HEAD WITHOUT CONTRAST TECHNIQUE: Multiplanar, multiecho pulse sequences of the brain and surrounding structures were obtained without intravenous contrast. COMPARISON:  CT head from the same day. FINDINGS: Brain: No convincing acute infarct. Punctate focus of DWI hyperintensity in the right cerebellum has no ADC correlate and is favored artifactual. Remote bilateral occipital infarcts, larger on the left. Additional scattered T2/FLAIR hyperintensity in the white matter, nonspecific but compatible with chronic microvascular ischemic disease. Vascular: Major arterial flow voids are maintained at the skull base. Skull and upper cervical spine: Normal marrow signal. Sinuses/Orbits: Clear sinuses.  No acute orbital findings. Other: Small right mastoid effusion. IMPRESSION: 1. No evidence of acute intracranial abnormality. 2. Remote cerebellar and occipital infarcts. Electronically Signed   By: FMargaretha Sheffield M.D.   On: 01/08/2023 18:37   CT HEAD WO CONTRAST (5MM)  Result Date: 01/08/2023 CLINICAL DATA:  Mental status change, unknown cause EXAM: CT HEAD WITHOUT CONTRAST TECHNIQUE: Contiguous axial images were obtained from the base of the skull through the vertex without intravenous contrast. RADIATION DOSE REDUCTION: This exam was performed according to the departmental dose-optimization program which includes automated exposure control, adjustment of the mA and/or kV according to patient size and/or use of iterative reconstruction technique. COMPARISON:  CT head June 02, 2022. FINDINGS: Brain: Chronic occipital and cerebellar infarcts. No evidence of new/interval acute large vascular territory infarct, acute hemorrhage, mass lesion or midline shift. No hydrocephalus. Vascular: Calcific atherosclerosis. Skull: No acute fracture. Sinuses/Orbits: Clear sinuses.  No acute orbital findings. Other: No mastoid few IMPRESSION: 1. No evidence of acute intracranial abnormality. 2. Chronic occipital and cerebellar infarcts. Electronically Signed   By: Margaretha Sheffield M.D.   On: 01/08/2023 16:05   CT ABDOMEN PELVIS W CONTRAST  Result Date: 01/05/2023 CLINICAL DATA:  Abdominal pain. EXAM: CT ABDOMEN AND PELVIS WITH CONTRAST TECHNIQUE: Multidetector CT imaging of the abdomen and pelvis was performed using the standard protocol following bolus administration of intravenous contrast. RADIATION DOSE REDUCTION: This exam was performed according to the departmental dose-optimization program which includes automated exposure control, adjustment of the mA and/or kV according to patient size and/or use of iterative reconstruction technique. CONTRAST:  36m OMNIPAQUE IOHEXOL 300 MG/ML  SOLN COMPARISON:  CT abdomen pelvis dated 06/01/2022. FINDINGS: Lower chest: Bibasilar linear atelectasis/scarring. The visualized lung bases are otherwise clear. There is coronary vascular calcification. No intra-abdominal free air or free fluid.  Hepatobiliary: The liver is unremarkable. Mild biliary dilatation, likely post cholecystectomy. No retained calcified stone noted in the central CBD. Pancreas: The pancreas is atrophic.  No active inflammatory changes. Spleen: Normal in size without focal abnormality. Adrenals/Urinary Tract: The adrenal glands are unremarkable. There is no hydronephrosis or nephrolithiasis on either side. Left renal upper pole 3 cm cyst. No imaging follow-up. There is extrarenal pelvis bilaterally with mild pelviectasis. The visualized ureters appear unremarkable. The urinary bladder is distended. Stomach/Bowel: There is sigmoid diverticulosis without active inflammatory changes. There is large amount of stool throughout the colon. There is no bowel obstruction or active inflammation. The appendix is not visualized with certainty. No inflammatory changes identified in the right lower quadrant. Vascular/Lymphatic: Moderate aortoiliac atherosclerotic disease. The IVC is unremarkable. No portal venous gas. There is no adenopathy. Reproductive: Hysterectomy. The right ovary is grossly of the. The left ovary is not visualized. Other: None Musculoskeletal: Osteopenia with degenerative changes of the spine. Several old healed left rib fractures. Lower lumbar fusion and fixation screws through the left femoral neck. Similar appearance of fragmentation of the left femoral head. No acute osseous pathology. IMPRESSION: 1. No acute intra-abdominal or pelvic pathology. 2. Sigmoid diverticulosis. 3. Constipation.  No bowel obstruction. 4.  Aortic Atherosclerosis (ICD10-I70.0). Electronically Signed   By: AAnner CreteM.D.   On: 01/05/2023 21:40   DG Chest Port 1 View  Result Date: 01/05/2023 CLINICAL DATA:  weakness EXAM: PORTABLE CHEST - 1 VIEW COMPARISON:  12/11/2022 FINDINGS: Cardiac silhouette is unremarkable. No pneumothorax or pleural effusion. The lungs are clear. Aorta is calcified. Old healed posterior left third through sixth  rib fractures noted. IMPRESSION: No acute cardiopulmonary process. Electronically Signed   By: JSammie BenchM.D.   On: 01/05/2023 16:30   DG HIP UNILAT WITH PELVIS 1V LEFT  Result Date: 12/15/2022 CLINICAL DATA:  Fall with LEFT hip pain. EXAM: DG HIP (WITH OR WITHOUT PELVIS) 3 V *L* COMPARISON:  07/12/2021 FINDINGS: No acute fracture or dislocation identified. Surgical hardware within the proximal LEFT femur and LOWER lumbar spine again noted. No suspicious focal bony lesions are identified. IMPRESSION: No evidence of acute bony abnormality. Electronically Signed   By: JMargarette CanadaM.D.   On: 12/15/2022 13:21   DG Knee 1-2 Views Left  Result Date: 12/15/2022 CLINICAL DATA:  Multiple falls recently. Left knee  pain, swelling, and bruising. EXAM: LEFT KNEE - 1-2 VIEW COMPARISON:  None Available. FINDINGS: There is diffuse decreased bone mineralization. Severe lateral compartment joint space narrowing and peripheral osteophytosis. Mild medial and lateral compartment chondrocalcinosis. Moderate superior and inferior patellar degenerative osteophytes. Small joint effusion. Mild chronic calcific density overlying the distal quadriceps tendon. No acute fracture is seen. No dislocation. There is moderate soft tissue swelling of the anterior superior knee at the level of the distal quadriceps tendon. Moderate vascular calcifications. IMPRESSION: 1. Moderate soft tissue swelling of the anterior superior knee at the level of the distal quadriceps tendon. No acute fracture is seen. 2. 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 Electronically Signed   By: Yvonne Kendall M.D.   On: 12/15/2022 13:19   DG Chest Portable 1 View  Result Date: 12/11/2022 CLINICAL DATA:  Generalized weakness, nausea, fall EXAM: PORTABLE CHEST 1 VIEW COMPARISON:  06/02/2022 FINDINGS: Cardiac size is within normal limits. Lung fields are clear of any pulmonary edema or new focal infiltrates. There is no  pleural effusion or pneumothorax. Old healed left rib fractures have not changed significantly. IMPRESSION: No active disease. Electronically Signed   By: Elmer Picker M.D.   On: 12/11/2022 13:52       Discharge Exam: Vitals:   01/09/23 2100 01/10/23 0323  BP: 117/72 124/74  Pulse: 80 77  Resp: 20 18  Temp: 97.9 F (36.6 C) 97.9 F (36.6 C)  SpO2: 98% 96%    General: Pt is alert, awake, not in acute distress, HOH  Cardiovascular: S1/S2 +, irreg rhythm, NSR with PAC on tele  Respiratory: CTA bilaterally, no wheezing, no rhonchi, no respiratory distress Abdominal: Soft, NT, ND, bowel sounds + Psych: Normal mood and affect   The results of significant diagnostics from this hospitalization (including imaging, microbiology, ancillary and laboratory) are listed below for reference.     Microbiology: Recent Results (from the past 240 hour(s))  Resp panel by RT-PCR (RSV, Flu A&B, Covid) Anterior Nasal Swab     Status: Abnormal   Collection Time: 01/05/23  4:26 PM   Specimen: Anterior Nasal Swab  Result Value Ref Range Status   SARS Coronavirus 2 by RT PCR NEGATIVE NEGATIVE Final    Comment: (NOTE) SARS-CoV-2 target nucleic acids are NOT DETECTED.  The SARS-CoV-2 RNA is generally detectable in upper respiratory specimens during the acute phase of infection. The lowest concentration of SARS-CoV-2 viral copies this assay can detect is 138 copies/mL. A negative result does not preclude SARS-Cov-2 infection and should not be used as the sole basis for treatment or other patient management decisions. A negative result may occur with  improper specimen collection/handling, submission of specimen other than nasopharyngeal swab, presence of viral mutation(s) within the areas targeted by this assay, and inadequate number of viral copies(<138 copies/mL). A negative result must be combined with clinical observations, patient history, and epidemiological information. The expected  result is Negative.  Fact Sheet for Patients:  EntrepreneurPulse.com.au  Fact Sheet for Healthcare Providers:  IncredibleEmployment.be  This test is no t yet approved or cleared by the [place] FDA and  has been authorized for detection and/or diagnosis of SARS-CoV-2 by FDA under an Emergency Use Authorization (EUA). This EUA will remain  in effect (meaning this test can be used) for the duration of the COVID-19 declaration under Section 564(b)(1) of the Act, 21 U.S.C.section 360bbb-3(b)(1), unless the authorization is terminated  or revoked sooner.       Influenza A by PCR  NEGATIVE NEGATIVE Final   Influenza B by PCR NEGATIVE NEGATIVE Final    Comment: (NOTE) The Xpert Xpress SARS-CoV-2/FLU/RSV plus assay is intended as an aid in the diagnosis of influenza from Nasopharyngeal swab specimens and should not be used as a sole basis for treatment. Nasal washings and aspirates are unacceptable for Xpert Xpress SARS-CoV-2/FLU/RSV testing.  Fact Sheet for Patients: EntrepreneurPulse.com.au  Fact Sheet for Healthcare Providers: IncredibleEmployment.be  This test is not yet approved or cleared by the [place] FDA and has been authorized for detection and/or diagnosis of SARS-CoV-2 by FDA under an Emergency Use Authorization (EUA). This EUA will remain in effect (meaning this test can be used) for the duration of the COVID-19 declaration under Section 564(b)(1) of the Act, 21 U.S.C. section 360bbb-3(b)(1), unless the authorization is terminated or revoked.     Resp Syncytial Virus by PCR POSITIVE (A) NEGATIVE Final    Comment: (NOTE) Fact Sheet for Patients: EntrepreneurPulse.com.au  Fact Sheet for Healthcare Providers: IncredibleEmployment.be  This test is not yet approved or cleared by the [place] FDA and has been authorized for detection and/or  diagnosis of SARS-CoV-2 by FDA under an Emergency Use Authorization (EUA). This EUA will remain in effect (meaning this test can be used) for the duration of the COVID-19 declaration under Section 564(b)(1) of the Act, 21 U.S.C. section 360bbb-3(b)(1), unless the authorization is terminated or revoked.  Performed at [facility], [address], [place], [place] [address]   Urine Culture     Status: Abnormal   Collection Time: 01/05/23 11:01 PM   Specimen: Urine, Clean Catch  Result Value Ref Range Status   Specimen Description   Final    URINE, CLEAN CATCH Performed at [facility], [address], [place], [place] [address]    Special Requests   Final    NONE Performed at [facility], [address] [place], [place] [address]    Culture MULTIPLE SPECIES PRESENT, SUGGEST RECOLLECTION (A)  Final   Report Status 01/07/2023 FINAL  Final  Resp panel by RT-PCR (RSV, Flu A&B, Covid) Anterior Nasal Swab     Status: Abnormal   Collection Time: 01/08/23 11:55 AM   Specimen: Anterior Nasal Swab  Result Value Ref Range Status   SARS Coronavirus 2 by RT PCR NEGATIVE NEGATIVE Final    Comment: (NOTE) SARS-CoV-2 target nucleic acids are NOT DETECTED.  The SARS-CoV-2 RNA is generally detectable in upper respiratory specimens during the acute phase of infection. The lowest concentration of SARS-CoV-2 viral copies this assay can detect is 138 copies/mL. A negative result does not preclude SARS-Cov-2 infection and should not be used as the sole basis for treatment or other patient management decisions. A negative result may occur with  improper specimen collection/handling, submission of specimen other than nasopharyngeal swab, presence of viral mutation(s) within the areas targeted by this assay, and inadequate number of viral copies(<138 copies/mL). A negative result must be combined with clinical observations, patient history, and epidemiological information. The expected result is  Negative.  Fact Sheet for Patients:  EntrepreneurPulse.com.au  Fact Sheet for Healthcare Providers:  IncredibleEmployment.be  This test is no t yet approved or cleared by the [place] FDA and  has been authorized for detection and/or diagnosis of SARS-CoV-2 by FDA under an Emergency Use Authorization (EUA). This EUA will remain  in effect (meaning this test can be used) for the duration of the COVID-19 declaration under Section 564(b)(1) of the Act, 21 U.S.C.section 360bbb-3(b)(1), unless the authorization is terminated  or revoked sooner.       Influenza A by PCR NEGATIVE NEGATIVE Final   Influenza B by PCR NEGATIVE NEGATIVE Final    Comment: (NOTE) The Xpert Xpress SARS-CoV-2/FLU/RSV plus assay is intended as an aid in the diagnosis of influenza from Nasopharyngeal swab specimens and should not be used as a sole basis for treatment. Nasal washings and aspirates are unacceptable for Xpert Xpress SARS-CoV-2/FLU/RSV testing.  Fact Sheet for Patients: EntrepreneurPulse.com.au  Fact Sheet for Healthcare Providers: IncredibleEmployment.be  This test is not yet approved or cleared by the [place] FDA and has been authorized for detection and/or diagnosis of SARS-CoV-2 by FDA under an Emergency Use Authorization (EUA). This EUA will remain in effect (meaning this test can be used) for the duration of the COVID-19 declaration under Section 564(b)(1) of the Act, 21 U.S.C. section 360bbb-3(b)(1), unless the authorization is terminated or revoked.     Resp Syncytial Virus by PCR POSITIVE (A) NEGATIVE Final    Comment: (NOTE) Fact Sheet for Patients: EntrepreneurPulse.com.au  Fact Sheet for Healthcare Providers: IncredibleEmployment.be  This test is not yet approved or cleared by the [place] FDA and has been authorized for detection and/or diagnosis of  SARS-CoV-2 by FDA under an Emergency Use Authorization (EUA). This EUA will remain in effect (meaning this test can be used) for the duration of the COVID-19 declaration under Section 564(b)(1) of the Act, 21 U.S.C. section 360bbb-3(b)(1), unless the authorization is terminated or revoked.  Performed at [facility], [address], [place], [place] [address]      Labs: BNP (last 3 results) No results for input(s): "BNP" in the last 8760 hours. Basic Metabolic Panel: Recent Labs  Lab 01/07/23 0117 01/07/23 0909 01/08/23 0406 01/08/23 1528 01/09/23 0404 01/10/23 0323  NA 130* 130* 131* 131* 132* 131*  K 3.7 3.7 3.4* 4.3 3.6 3.6  CL 97* 97* 99 99 102 100  CO2 '24 24 24 22 22 24  '$ GLUCOSE 162* 188* 114* 133* 110* 145*  BUN '18 16 13 17 14 15  '$ CREATININE 0.88 0.92 0.81 0.82 0.72 0.78  CALCIUM 8.3* 8.6* 8.3* 8.6* 8.3* 8.0*  MG 1.7  --  1.8 1.9 1.8 1.8  PHOS  --   --   --  2.5  --   --    Liver Function Tests: Recent Labs  Lab 01/05/23 1616 01/06/23 0355 01/08/23 1528  AST 19 16  --   ALT 14 13  --   ALKPHOS 37* 33*  --   BILITOT 0.9 0.9  --   PROT 5.4* 5.0*  --   ALBUMIN 2.8* 2.6* 3.0*   No results for input(s): "LIPASE", "AMYLASE" in the last 168 hours. No results for input(s): "AMMONIA" in the last 168 hours. CBC: Recent Labs  Lab 01/05/23 1616 01/05/23 1627 01/06/23 0355 01/07/23 0117 01/08/23 0406 01/08/23 1528 01/10/23 0323  WBC 9.0  --  7.3 14.1* 9.5 13.5* 7.7  NEUTROABS 5.8  --  4.2  --   --   --   --   HGB 9.4*   < > 9.7* 9.7* 9.7* 10.6* 9.7*  HCT 28.5*   < > 29.3* 29.2* 29.5* 32.9* 29.8*  MCV 93.4  --  92.7 93.0 93.7 94.8 94.0  PLT 244  --  254 266 260 291 247   < > = values in this interval not displayed.   Cardiac Enzymes: Recent Labs  Lab 01/05/23 1616  CKTOTAL 112   BNP: Invalid input(s): "POCBNP" CBG: Recent  Labs  Lab 01/09/23 0729 01/09/23 1117 01/09/23 1559 01/09/23 2102 01/10/23 0740  GLUCAP 119* 227* 138* 189* 155*    D-Dimer No results for input(s): "DDIMER" in the last 72 hours. Hgb A1c No results for input(s): "HGBA1C" in the last 72 hours. Lipid Profile Recent Labs    01/08/23 0406  CHOL 171  HDL 71  LDLCALC 86  TRIG 69  CHOLHDL 2.4   Thyroid function studies No results for input(s): "TSH", "T4TOTAL", "T3FREE", "THYROIDAB" in the last 72 hours.  Invalid input(s): "FREET3"  Anemia work up No results for input(s): "VITAMINB12", "FOLATE", "FERRITIN", "TIBC", "IRON", "RETICCTPCT" in the last 72 hours. Urinalysis    Component Value Date/Time   COLORURINE COLORLESS (A) 01/05/2023 2301   APPEARANCEUR CLEAR 01/05/2023 2301   LABSPEC 1.009 01/05/2023 2301   PHURINE 7.0 01/05/2023 2301   GLUCOSEU NEGATIVE 01/05/2023 2301   HGBUR NEGATIVE 01/05/2023 2301   BILIRUBINUR NEGATIVE 01/05/2023 2301   KETONESUR NEGATIVE 01/05/2023 2301   PROTEINUR NEGATIVE 01/05/2023 2301   NITRITE NEGATIVE 01/05/2023 2301   LEUKOCYTESUR NEGATIVE 01/05/2023 2301   Sepsis Labs Recent Labs  Lab 01/07/23 0117 01/08/23 0406 01/08/23 1528 01/10/23 0323  WBC 14.1* 9.5 13.5* 7.7   Microbiology Recent Results (from the past 240 hour(s))  Resp panel by RT-PCR (RSV, Flu A&B, Covid) Anterior Nasal Swab     Status: Abnormal   Collection Time: 01/05/23  4:26 PM   Specimen: Anterior Nasal Swab  Result Value Ref Range Status   SARS Coronavirus 2 by RT PCR NEGATIVE NEGATIVE Final    Comment: (NOTE) SARS-CoV-2 target nucleic acids are NOT DETECTED.  The SARS-CoV-2 RNA is generally detectable in upper respiratory specimens during the acute phase of infection. The lowest concentration of SARS-CoV-2 viral copies this assay can detect is 138 copies/mL. A negative result does not preclude SARS-Cov-2 infection and should not be used as the sole basis for treatment or other patient management decisions. A negative result may occur with  improper specimen collection/handling, submission of specimen other than  nasopharyngeal swab, presence of viral mutation(s) within the areas targeted by this assay, and inadequate number of viral copies(<138 copies/mL). A negative result must be combined with clinical observations, patient history, and epidemiological information. The expected result is Negative.  Fact Sheet for Patients:  EntrepreneurPulse.com.au  Fact Sheet for Healthcare Providers:  IncredibleEmployment.be  This test is no t yet approved or cleared by the [place] FDA and  has been authorized for detection and/or diagnosis of SARS-CoV-2 by FDA under an Emergency Use Authorization (EUA). This EUA will remain  in effect (meaning this test can be used) for the duration of the COVID-19 declaration under Section 564(b)(1) of the Act, 21 U.S.C.section 360bbb-3(b)(1), unless the authorization is terminated  or revoked sooner.       Influenza A by PCR NEGATIVE NEGATIVE Final   Influenza B by PCR NEGATIVE NEGATIVE Final    Comment: (NOTE) The Xpert Xpress SARS-CoV-2/FLU/RSV plus assay is intended as an aid in the diagnosis of influenza from Nasopharyngeal swab specimens and should not be used as a sole basis for treatment. Nasal washings and aspirates are unacceptable for Xpert Xpress SARS-CoV-2/FLU/RSV testing.  Fact Sheet for Patients: EntrepreneurPulse.com.au  Fact Sheet for Healthcare Providers: IncredibleEmployment.be  This test is not yet approved or cleared by the [place] FDA and has been authorized for detection and/or diagnosis of SARS-CoV-2 by FDA under an Emergency Use Authorization (EUA). This EUA will remain in effect (meaning this test can be  used) for the duration of the COVID-19 declaration under Section 564(b)(1) of the Act, 21 U.S.C. section 360bbb-3(b)(1), unless the authorization is terminated or revoked.     Resp Syncytial Virus by PCR POSITIVE (A) NEGATIVE Final    Comment:  (NOTE) Fact Sheet for Patients: EntrepreneurPulse.com.au  Fact Sheet for Healthcare Providers: IncredibleEmployment.be  This test is not yet approved or cleared by the [place] FDA and has been authorized for detection and/or diagnosis of SARS-CoV-2 by FDA under an Emergency Use Authorization (EUA). This EUA will remain in effect (meaning this test can be used) for the duration of the COVID-19 declaration under Section 564(b)(1) of the Act, 21 U.S.C. section 360bbb-3(b)(1), unless the authorization is terminated or revoked.  Performed at [facility], [address] [place], [place] [address]   Urine Culture     Status: Abnormal   Collection Time: 01/05/23 11:01 PM   Specimen: Urine, Clean Catch  Result Value Ref Range Status   Specimen Description   Final    URINE, CLEAN CATCH Performed at [facility], [address] [place], [place] [address]    Special Requests   Final    NONE Performed at [facility], [address], [place], [place] [address]    Culture MULTIPLE SPECIES PRESENT, SUGGEST RECOLLECTION (A)  Final   Report Status 01/07/2023 FINAL  Final  Resp panel by RT-PCR (RSV, Flu A&B, Covid) Anterior Nasal Swab     Status: Abnormal   Collection Time: 01/08/23 11:55 AM   Specimen: Anterior Nasal Swab  Result Value Ref Range Status   SARS Coronavirus 2 by RT PCR NEGATIVE NEGATIVE Final    Comment: (NOTE) SARS-CoV-2 target nucleic acids are NOT DETECTED.  The SARS-CoV-2 RNA is generally detectable in upper respiratory specimens during the acute phase of infection. The lowest concentration of SARS-CoV-2 viral copies this assay can detect is 138 copies/mL. A negative result does not preclude SARS-Cov-2 infection and should not be used as the sole basis for treatment or other patient management decisions. A negative result may occur with  improper specimen collection/handling, submission of specimen other than nasopharyngeal swab,  presence of viral mutation(s) within the areas targeted by this assay, and inadequate number of viral copies(<138 copies/mL). A negative result must be combined with clinical observations, patient history, and epidemiological information. The expected result is Negative.  Fact Sheet for Patients:  EntrepreneurPulse.com.au  Fact Sheet for Healthcare Providers:  IncredibleEmployment.be  This test is no t yet approved or cleared by the [place] FDA and  has been authorized for detection and/or diagnosis of SARS-CoV-2 by FDA under an Emergency Use Authorization (EUA). This EUA will remain  in effect (meaning this test can be used) for the duration of the COVID-19 declaration under Section 564(b)(1) of the Act, 21 U.S.C.section 360bbb-3(b)(1), unless the authorization is terminated  or revoked sooner.       Influenza A by PCR NEGATIVE NEGATIVE Final   Influenza B by PCR NEGATIVE NEGATIVE Final    Comment: (NOTE) The Xpert Xpress SARS-CoV-2/FLU/RSV plus assay is intended as an aid in the diagnosis of influenza from Nasopharyngeal swab specimens and should not be used as a sole basis for treatment. Nasal washings and aspirates are unacceptable for Xpert Xpress SARS-CoV-2/FLU/RSV testing.  Fact Sheet for Patients: EntrepreneurPulse.com.au  Fact Sheet for Healthcare Providers: IncredibleEmployment.be  This test is not yet approved or cleared by the [place] FDA and has been authorized for detection and/or diagnosis of SARS-CoV-2 by FDA under an Emergency  Use Authorization (EUA). This EUA will remain in effect (meaning this test can be used) for the duration of the COVID-19 declaration under Section 564(b)(1) of the Act, 21 U.S.C. section 360bbb-3(b)(1), unless the authorization is terminated or revoked.     Resp Syncytial Virus by PCR POSITIVE (A) NEGATIVE Final    Comment: (NOTE) Fact Sheet for  Patients: EntrepreneurPulse.com.au  Fact Sheet for Healthcare Providers: IncredibleEmployment.be  This test is not yet approved or cleared by the [place] FDA and has been authorized for detection and/or diagnosis of SARS-CoV-2 by FDA under an Emergency Use Authorization (EUA). This EUA will remain in effect (meaning this test can be used) for the duration of the COVID-19 declaration under Section 564(b)(1) of the Act, 21 U.S.C. section 360bbb-3(b)(1), unless the authorization is terminated or revoked.  Performed at [facility], [address], [place], [place] [address]      Patient was seen and examined on the day of discharge and was found to be in stable condition. Time coordinating discharge: 35 minutes including assessment and coordination of care, as well as examination of the patient.   SIGNED:  Dessa Phi, DO [facility] 01/10/2023, 9:11 AM

## 2023-01-10 NOTE — Progress Notes (Signed)
Tele called informing this nurse pt had a 12 beat run of SVT. MD aware, EKG performed. Plan of care ongoing.

## 2023-01-10 NOTE — TOC Transition Note (Signed)
Transition of Care [facility]) - CM/SW Discharge Note   Patient Details  Name: Wanda Shields MRN: [id number] Date of Birth: [birth year]  Transition of Care [facility]) CM/SW Contact:  Boneta Lucks, RN Phone Number: 01/10/2023, 10:35 AM   Clinical Narrative:  Patient ready to discharge back to [facility].  RN to call report, COVID test is negative. Left message with her son with discharge plan.   Final next level of care: Skilled Nursing Facility Barriers to Discharge: Barriers Resolved   Patient Goals and CMS Choice CMS Medicare.gov Compare Post Acute Care list provided to:: Patient Choice offered to / list presented to : Patient  Discharge Placement                Patient chooses bed at: [facility] Patient to be transferred to facility by: [facility] staff Name of family member notified: Manuela Schwartz Patient and family notified of of transfer: 01/10/23  Discharge Plan and Services Additional resources added to the After Visit Summary for   In-house Referral: Clinical Social Work   Post Acute Care Choice: [facility]                 Social Determinants of Health ([place]) Interventions SDOH Screenings   Food Insecurity: No Food Insecurity (01/06/2023)  Recent Concern: [place] Present (12/12/2022)  Housing: Low Risk  (01/06/2023)  Transportation Needs: No Transportation Needs (01/06/2023)  Utilities: Not At Risk (01/06/2023)  Tobacco Use: Low Risk  (01/09/2023)

## 2023-01-13 ENCOUNTER — Encounter: Payer: Self-pay | Admitting: Adult Health

## 2023-01-13 ENCOUNTER — Non-Acute Institutional Stay (SKILLED_NURSING_FACILITY): Payer: Medicare Other | Admitting: Adult Health

## 2023-01-13 DIAGNOSIS — I700 Atherosclerosis of aorta: Secondary | ICD-10-CM

## 2023-01-13 DIAGNOSIS — E1122 Type 2 diabetes mellitus with diabetic chronic kidney disease: Secondary | ICD-10-CM | POA: Diagnosis not present

## 2023-01-13 DIAGNOSIS — Z8673 Personal history of transient ischemic attack (TIA), and cerebral infarction without residual deficits: Secondary | ICD-10-CM | POA: Diagnosis not present

## 2023-01-13 DIAGNOSIS — R531 Weakness: Secondary | ICD-10-CM | POA: Diagnosis not present

## 2023-01-13 DIAGNOSIS — Z794 Long term (current) use of insulin: Secondary | ICD-10-CM | POA: Diagnosis not present

## 2023-01-13 DIAGNOSIS — N1831 Chronic kidney disease, stage 3a: Secondary | ICD-10-CM

## 2023-01-13 DIAGNOSIS — M810 Age-related osteoporosis without current pathological fracture: Secondary | ICD-10-CM | POA: Diagnosis not present

## 2023-01-13 DIAGNOSIS — E1159 Type 2 diabetes mellitus with other circulatory complications: Secondary | ICD-10-CM | POA: Diagnosis not present

## 2023-01-13 DIAGNOSIS — M170 Bilateral primary osteoarthritis of knee: Secondary | ICD-10-CM | POA: Diagnosis not present

## 2023-01-13 DIAGNOSIS — I152 Hypertension secondary to endocrine disorders: Secondary | ICD-10-CM | POA: Diagnosis not present

## 2023-01-13 DIAGNOSIS — E871 Hypo-osmolality and hyponatremia: Secondary | ICD-10-CM | POA: Diagnosis not present

## 2023-01-13 NOTE — Progress Notes (Signed)
Location:  [facility] Room Number: 119E Place of Service:  SNF (31)   CODE STATUS: DNR  Allergies  Allergen Reactions   Atropine    Demerol [Meperidine Hcl]    Penicillins     Chief Complaint  Patient presents with   Hospitalization Follow-up    Follow-up visit recent from the hospital.    HPI:  She is a 89 year old woman who has been hospitalized from 01-05-23 through 01-10-23. 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 She had been discharged to home from this facility around 01-01-23. Since she has returned home she has gotten weaker and has had constipation despite eating well. She was admitted with a sodium of 123 and mag 1.5.  her lab work was inconsistent with SIADH. Her other blood work was within normal limits. She was set to come to this facility on 01-08-23; however; she became unresponsive. She did return back to baseline. EEG was negative for epileptiform discharges. She is here for short term rehab with her goal to return back home. I am not certain if this will be an option for her. She will continue to be followed for her chronic illnesses including:  Type 2 diabetes mellitus with hyperglycemia without long term current use of insulin:  Hypertension associated with diabetes: Post menopausal osteoporosis: Bilateral primary osteoarthritis of knees  Past Medical History:  Diagnosis Date   Arthritis    Carpal tunnel syndrome    Hyponatremia    Macular degeneration    RSV (respiratory syncytial virus infection)    Type 2 diabetes mellitus ([place])    UTI (urinary tract infection)     Past Surgical History:  Procedure Laterality Date   ABDOMINAL HYSTERECTOMY     APPENDECTOMY     BACK SURGERY     CHOLECYSTECTOMY     HIP SURGERY     TONSILLECTOMY      Social History   Socioeconomic History   Marital status: Widowed    Spouse name: Not on file   Number of children: Not on file    Years of education: Not on file   Highest education level: Not on file  Occupational History   Not on file  Tobacco Use   Smoking status: Never   Smokeless tobacco: Never  Vaping Use   Vaping Use: Never used  Substance and Sexual Activity   Alcohol use: Never   Drug use: Never   Sexual activity: Not on file  Other Topics Concern   Not on file  Social History Narrative   Not on file   Social Determinants of Health   Financial Resource Strain: Not on file  Food Insecurity: No Food Insecurity (01/06/2023)   Hunger Vital Sign    Worried About Running Out of Food in the Last Year: Never true    Ran Out of Food in the Last Year: Never true  Recent Concern: Food Insecurity - Food Insecurity Present (12/12/2022)   Hunger Vital Sign    Worried About Running Out of Food in the Last Year: Never true    [place] in the Last Year: Sometimes true  Transportation Needs: No Transportation Needs (01/06/2023)   PRAPARE - Hydrologist (Medical): No    Lack of Transportation (Non-Medical): No  Physical Activity: Not on file  Stress: Not on file  Social Connections: Not on file  Intimate Partner Violence: Not At Risk (01/06/2023)  Humiliation, Afraid, Rape, and Kick questionnaire    Fear of Current or Ex-Partner: No    Emotionally Abused: No    Physically Abused: No    Sexually Abused: No   History reviewed. No pertinent family history.    VITAL SIGNS BP 116/82   Pulse 72   Temp 98.5 F (36.9 C)   Resp 20   Ht '5\' 6"'$  (1.676 m)   Wt 134 lb 2 oz (60.8 kg)   SpO2 99%   BMI 21.65 kg/m   Outpatient Encounter Medications as of 01/13/2023  Medication Sig   acetaminophen (ACETAMINOPHEN 8 HOUR) 650 MG CR tablet Take 650 mg by mouth every 6 (six) hours. DX: Polyosteoarthritis, unspecified   alendronate (FOSAMAX) 70 MG tablet Take 1 tablet (70 mg total) by mouth once a week. Sunday   Aspirin 81 MG CAPS Take 1 capsule by mouth daily.   Balsam [place]-Castor  Oil (VENELEX) OINT Apply 1 Application topically as directed. Every Shift Day, Evening, Night   Calcium Carb-Cholecalciferol (CALCIUM 600+D3 PO) Take 1 tablet by mouth 2 (two) times daily.   celecoxib (CELEBREX) 200 MG capsule Take 1 capsule (200 mg total) by mouth daily.   Cholecalciferol (VITAMIN D3) 10 MCG (400 UNIT) tablet Take 400 Units by mouth daily.   diclofenac Sodium (VOLTAREN ARTHRITIS PAIN) 1 % GEL Apply 2 g topically 4 (four) times daily.   docusate sodium (COLACE) 100 MG capsule Take 100 mg by mouth every evening.   insulin glargine (LANTUS SOLOSTAR) 100 UNIT/ML Solostar Pen Inject 10 Units into the skin at bedtime.   Insulin Pen Needle (BD AUTOSHIELD DUO) 30G X 5 MM MISC 1 Needle by Does not apply route as directed. with insulin   miconazole (MICRO GUARD) 2 % powder Apply topically. apply powder under (R) breast fungal rash q shift x 14 days Every Shift   Multiple Vitamins-Minerals (PRESERVISION AREDS PO) Take 1 tablet by mouth 2 (two) times daily.   ondansetron (ZOFRAN-ODT) 8 MG disintegrating tablet Take 8 mg by mouth 3 (three) times daily as needed for nausea or vomiting.   polyethylene glycol (MIRALAX / GLYCOLAX) 17 g packet Take 17 g by mouth daily as needed for mild constipation or moderate constipation.   sitaGLIPtin (JANUVIA) 25 MG tablet Take 1 tablet (25 mg total) by mouth daily.   sodium chloride 1 g tablet Take 1 tablet (1 g total) by mouth 3 (three) times daily with meals. DX: Hypo-osmolality and hyponatremia   vitamin B-12 (CYANOCOBALAMIN) 500 MCG tablet Take 500 mcg by mouth daily.   No facility-administered encounter medications on file as of 01/13/2023.     SIGNIFICANT DIAGNOSTIC EXAMS   LABS REVIEWED: PREVIOUS   12-11-22: wbc 8.6; hgb 11.1; hct 34.1; mcv 93.9 plt 178; glucose 270; bun 14; creat 0.82; k+ 4.0; na++ 126; ca 8.7; gfr >60; hgb A1c 7.8; urine culture: e-coli 12-29-22: glucose 318; bun 19; creat 0.99; k+ 4.6; na++ 124; ca 8.4; gfr  54  TODAY  01-05-23: wbc 9.0; hgb 9.4; hct 28.5; mcv 93.4 plt 244; glucose 179; bun 24; creat 0.97; k+ 4.9; na++ 123; ca 8.8; gfr 56; protein 5.4; albumin 2.8; mag 1.5; urine culture: multiple  01-06-23; tsh 2.408 vitamin B1: 80.2 01-07-23; glucose 188; bun 16; creat 0.92; k+ 3.7; na++ 130; ca 8.6 gfr 59 01-08-23: wbc 9.5; hgb 9.7; hct 29.5; mcv 93.7 plt 260; glucose 114; bun 13; creat 0.81; k+ 3.4; na++ 131; ca 8.3; gfr 60; mag 1.8 AM cortisol 13.7; chol 171; ldl  86; trig 69 hdl 71 01-10-23; wbc 7.7; hgb 9.7; hct 29.8; mcv 94.0 plt 247; glucose 145; bun 15; creat 0.78; k+ 3.6; an++ 131; ca 8.0; gfr >60   Review of Systems  Constitutional:  Negative for malaise/fatigue.  Respiratory:  Negative for cough and shortness of breath.   Cardiovascular:  Negative for chest pain, palpitations and leg swelling.  Gastrointestinal:  Negative for abdominal pain, constipation and heartburn.  Musculoskeletal:  Negative for back pain, joint pain and myalgias.  Skin: Negative.   Neurological:  Negative for dizziness.  Psychiatric/Behavioral:  The patient is not nervous/anxious.    Physical Exam Constitutional:      General: She is not in acute distress.    Appearance: She is well-developed. She is not diaphoretic.  Neck:     Thyroid: No thyromegaly.  Cardiovascular:     Rate and Rhythm: Normal rate and regular rhythm.     Pulses: Normal pulses.     Heart sounds: Normal heart sounds.  Pulmonary:     Effort: Pulmonary effort is normal. No respiratory distress.     Breath sounds: Normal breath sounds.  Abdominal:     General: Bowel sounds are normal. There is no distension.     Palpations: Abdomen is soft.     Tenderness: There is no abdominal tenderness.  Musculoskeletal:        General: Normal range of motion.     Cervical back: Neck supple.     Right lower leg: No edema.     Left lower leg: No edema.  Lymphadenopathy:     Cervical: No cervical adenopathy.  Skin:    General: Skin is warm and  dry.  Neurological:     Mental Status: She is alert. Mental status is at baseline.  Psychiatric:        Mood and Affect: Mood normal.       ASSESSMENT/ PLAN:  TODAY  Type 2 diabetes mellitus with hyperglycemia without long term current use of insulin: hgb A1c 7.8;  will continue lantus 10 units nightly januvia 25 mg daily   2. Hypertension associated with diabetes: b/p 116/82 will continue  asa 81 mg daily   3. Post menopausal osteoporosis: will continue fosamax 70 mg weekly   4. Bilateral primary osteoarthritis of knees: will continue voltaren gel 2 gm four times daily; celebrex 200 mg daily tylenol 650 mg every 6 hours   5. History of cva: will continue asa 81 mg daily   6. Hyponatremia: will continue nacl 1 gm three times daily   7. Post menopausal osteoporosis: will continue fosamax 70 mg weekly   8. Generalized weakness: will continue therapy as directed to improve upon her independence with her adls.   9.  Chronic constipation: will continue colace daily and miralax daily as needed   10. Thoracic aortic atherosclerosis (10-04-21 ct)    Ok Edwards NP [facility]   call [phone]

## 2023-01-14 ENCOUNTER — Other Ambulatory Visit: Payer: Self-pay | Admitting: *Deleted

## 2023-01-14 NOTE — Patient Outreach (Signed)
[facility] Post-Acute Care Coordinator follow up. Mrs. Rayment resides in [facility] skilled nursing. Screening for potential [facility] care coordination services as benefit of health plan and Primary Care Provider.   Collaboration with Melton Alar social worker. Anticipated transition plans are to return home  Will continue to follow. Will plan outreach to family as appropriate.   Raiford Noble, MSN, RN,BSN [facility] Post Acute Care Coordinator [phone] (Direct dial)

## 2023-01-16 ENCOUNTER — Other Ambulatory Visit (HOSPITAL_COMMUNITY)
Admission: RE | Admit: 2023-01-16 | Discharge: 2023-01-16 | Disposition: A | Discharge status: Home / Self Care | Payer: Medicare Other | Source: Skilled Nursing Facility | Attending: *Deleted | Admitting: *Deleted

## 2023-01-16 LAB — BASIC METABOLIC PANEL
Anion gap: 9 (ref 5–15)
BUN: 17 mg/dL (ref 8–23)
CO2: 26 mmol/L (ref 22–32)
Calcium: 9.8 mg/dL (ref 8.9–10.3)
Chloride: 95 mmol/L — ABNORMAL LOW (ref 98–111)
Creatinine, Ser: 0.89 mg/dL (ref 0.44–1.00)
GFR, Estimated: >60 mL/min (ref >60)
Glucose, Bld: 81 mg/dL (ref 70–99)
Potassium: 4.2 mmol/L (ref 3.5–5.1)
Sodium: 130 mmol/L — ABNORMAL LOW (ref 135–145)

## 2023-01-19 ENCOUNTER — Non-Acute Institutional Stay (SKILLED_NURSING_FACILITY): Payer: Medicare Other | Admitting: Internal Medicine

## 2023-01-19 DIAGNOSIS — Z794 Long term (current) use of insulin: Secondary | ICD-10-CM | POA: Diagnosis not present

## 2023-01-19 DIAGNOSIS — N1831 Chronic kidney disease, stage 3a: Secondary | ICD-10-CM | POA: Diagnosis not present

## 2023-01-19 DIAGNOSIS — E1122 Type 2 diabetes mellitus with diabetic chronic kidney disease: Secondary | ICD-10-CM | POA: Diagnosis not present

## 2023-01-19 DIAGNOSIS — D649 Anemia, unspecified: Secondary | ICD-10-CM | POA: Diagnosis not present

## 2023-01-19 DIAGNOSIS — J121 Respiratory syncytial virus pneumonia: Secondary | ICD-10-CM | POA: Diagnosis not present

## 2023-01-19 DIAGNOSIS — R627 Adult failure to thrive: Secondary | ICD-10-CM | POA: Diagnosis not present

## 2023-01-19 DIAGNOSIS — E871 Hypo-osmolality and hyponatremia: Secondary | ICD-10-CM | POA: Diagnosis not present

## 2023-01-19 NOTE — Progress Notes (Unsigned)
   NURSING HOME LOCATION:  [facility] ROOM NUMBER:  [id number] P  CODE STATUS:  DNR  PCP: Consuello Masse MD  This is a comprehensive admission note to this SNFperformed on this date less than 30 days from date of admission. Included are preadmission medical/surgical history; reconciled medication list; family history; social history and comprehensive review of systems.  Corrections and additions to the records were documented. Comprehensive physical exam was also performed. Additionally a clinical summary was entered for each active diagnosis pertinent to this admission in the Problem List to enhance continuity of care.  HPI:  Past medical and surgical history:  Social history:  Family history:   Review of systems: Clinical neurocognitive deficits made validity of responses questionable & preventing ROS completion. Date given as Constitutional: No fever, significant weight change, fatigue  Eyes: No redness, discharge, pain, vision change ENT/mouth: No nasal congestion, purulent discharge, earache, change in hearing, sore throat  Cardiovascular: No chest pain, palpitations, paroxysmal nocturnal dyspnea, claudication, edema  Respiratory: No cough, sputum production, hemoptysis, DOE, significant snoring, apnea Gastrointestinal: No heartburn, dysphagia, abdominal pain, nausea /vomiting, rectal bleeding, melena, change in bowels Genitourinary: No dysuria, hematuria, pyuria, incontinence, nocturia Musculoskeletal: No joint stiffness, joint swelling, weakness, pain Dermatologic: No rash, pruritus, change in appearance of skin Neurologic: No dizziness, headache, syncope, seizures, numbness, tingling Psychiatric: No significant anxiety, depression, insomnia, anorexia Endocrine: No change in hair/skin/nails, excessive thirst, excessive hunger, excessive urination  Hematologic/lymphatic: No significant bruising, lymphadenopathy, abnormal bleeding Allergy/immunology: No itchy/watery  eyes, significant sneezing, urticaria, angioedema  Physical exam:  Pertinent or positive findings: General appearance: Adequately nourished; no acute distress, increased work of breathing is present.   Lymphatic: No lymphadenopathy about the head, neck, axilla. Eyes: No conjunctival inflammation or lid edema is present. There is no scleral icterus. Ears:  External ear exam shows no significant lesions or deformities.   Nose:  External nasal examination shows no deformity or inflammation. Nasal mucosa are pink and moist without lesions, exudates Oral exam: Lips and gums are healthy appearing.There is no oropharyngeal erythema or exudate. Neck:  No thyromegaly, masses, tenderness noted.    Heart:  Normal rate and regular rhythm. S1 and S2 normal without gallop, murmur, click, rub.  Lungs: Chest clear to auscultation without wheezes, rhonchi, rales, rubs. Abdomen: Bowel sounds are normal.  Abdomen is soft and nontender with no organomegaly, hernias, masses. GU: Deferred  Extremities:  No cyanosis, clubbing, edema. Neurologic exam:  Strength equal  in upper & lower extremities. Balance, Rhomberg, finger to nose testing could not be completed due to clinical state Deep tendon reflexes are equal Skin: Warm & dry w/o tenting. No significant lesions or rash.  See clinical summary under each active problem in the Problem List with associated updated therapeutic plan

## 2023-01-20 ENCOUNTER — Encounter: Payer: Self-pay | Admitting: Internal Medicine

## 2023-01-20 DIAGNOSIS — D649 Anemia, unspecified: Secondary | ICD-10-CM | POA: Insufficient documentation

## 2023-01-20 NOTE — Assessment & Plan Note (Signed)
She denies any U or LRTI symptoms.O2 sats good on RA . No abnormal breath sounds. Monitor

## 2023-01-20 NOTE — Assessment & Plan Note (Signed)
Nadir Na+ 123; final value 130. Continue monitor

## 2023-01-20 NOTE — Patient Instructions (Signed)
See assessment and plan under each diagnosis in the problem list and acutely for this visit 

## 2023-01-20 NOTE — Assessment & Plan Note (Signed)
Daughter describes repeated trauma to BLE but no recurrent falls. PT/OT @ SNF as tolerated.

## 2023-01-20 NOTE — Assessment & Plan Note (Signed)
Hgb range as IP 8.4-10.8; final H/H 9.7/29.3. No bleeding dyscrasias reported by Staff.

## 2023-01-20 NOTE — Assessment & Plan Note (Addendum)
1/22-1/27/24 glucose range 110-235; current A1c 7.8% on 12/11/22. The  A1c of  means your daily average sugar for past 3 months is .  With an A1c @ this level , your long term risk of premature heart attack or stroke is  . Goal is < 8%; it is critical to avoid hypoglycemia which would mimic TIA. Nadir GFR 53 as IP, >60 @ discharge, Stage 2 CKD.

## 2023-01-24 ENCOUNTER — Other Ambulatory Visit: Payer: Self-pay

## 2023-01-24 ENCOUNTER — Encounter (HOSPITAL_COMMUNITY): Payer: Self-pay

## 2023-01-24 ENCOUNTER — Emergency Department (HOSPITAL_COMMUNITY)
Admission: EM | Admit: 2023-01-24 | Discharge: 2023-01-24 | Disposition: A | Discharge status: Other Acute Inpatient Hospital | Payer: Medicare Other | Attending: Emergency Medicine | Admitting: Emergency Medicine

## 2023-01-24 DIAGNOSIS — E119 Type 2 diabetes mellitus without complications: Secondary | ICD-10-CM | POA: Insufficient documentation

## 2023-01-24 DIAGNOSIS — R944 Abnormal results of kidney function studies: Secondary | ICD-10-CM | POA: Diagnosis not present

## 2023-01-24 DIAGNOSIS — Z7982 Long term (current) use of aspirin: Secondary | ICD-10-CM | POA: Insufficient documentation

## 2023-01-24 DIAGNOSIS — Z7984 Long term (current) use of oral hypoglycemic drugs: Secondary | ICD-10-CM | POA: Diagnosis not present

## 2023-01-24 DIAGNOSIS — E860 Dehydration: Secondary | ICD-10-CM | POA: Diagnosis not present

## 2023-01-24 DIAGNOSIS — R4182 Altered mental status, unspecified: Secondary | ICD-10-CM | POA: Diagnosis present

## 2023-01-24 DIAGNOSIS — D72829 Elevated white blood cell count, unspecified: Secondary | ICD-10-CM | POA: Diagnosis not present

## 2023-01-24 DIAGNOSIS — E871 Hypo-osmolality and hyponatremia: Secondary | ICD-10-CM | POA: Insufficient documentation

## 2023-01-24 DIAGNOSIS — R410 Disorientation, unspecified: Secondary | ICD-10-CM | POA: Diagnosis not present

## 2023-01-24 DIAGNOSIS — R0902 Hypoxemia: Secondary | ICD-10-CM | POA: Diagnosis not present

## 2023-01-24 DIAGNOSIS — Z743 Need for continuous supervision: Secondary | ICD-10-CM | POA: Diagnosis not present

## 2023-01-24 DIAGNOSIS — N3000 Acute cystitis without hematuria: Secondary | ICD-10-CM | POA: Insufficient documentation

## 2023-01-24 DIAGNOSIS — Z794 Long term (current) use of insulin: Secondary | ICD-10-CM | POA: Diagnosis not present

## 2023-01-24 DIAGNOSIS — R6889 Other general symptoms and signs: Secondary | ICD-10-CM | POA: Diagnosis not present

## 2023-01-24 DIAGNOSIS — R739 Hyperglycemia, unspecified: Secondary | ICD-10-CM | POA: Diagnosis not present

## 2023-01-24 LAB — CBC WITH DIFFERENTIAL/PLATELET
Abs Immature Granulocytes: 0.03 10*3/uL (ref 0.00–0.07)
Basophils Absolute: 0 10*3/uL (ref 0.0–0.1)
Basophils Relative: 0 %
Eosinophils Absolute: 0.1 10*3/uL (ref 0.0–0.5)
Eosinophils Relative: 0 %
HCT: 31.5 % — ABNORMAL LOW (ref 36.0–46.0)
Hemoglobin: 10.1 g/dL — ABNORMAL LOW (ref 12.0–15.0)
Immature Granulocytes: 0 %
Lymphocytes Relative: 19 %
Lymphs Abs: 2.7 10*3/uL (ref 0.7–4.0)
MCH: 30.6 pg (ref 26.0–34.0)
MCHC: 32.1 g/dL (ref 30.0–36.0)
MCV: 95.5 fL (ref 80.0–100.0)
Monocytes Absolute: 1.1 10*3/uL — ABNORMAL HIGH (ref 0.1–1.0)
Monocytes Relative: 8 %
Neutro Abs: 10.3 10*3/uL — ABNORMAL HIGH (ref 1.7–7.7)
Neutrophils Relative %: 73 %
Platelets: 281 10*3/uL (ref 150–400)
RBC: 3.3 MIL/uL — ABNORMAL LOW (ref 3.87–5.11)
RDW: 14.5 % (ref 11.5–15.5)
WBC: 14.2 10*3/uL — ABNORMAL HIGH (ref 4.0–10.5)
nRBC: 0 % (ref 0.0–0.2)

## 2023-01-24 LAB — TROPONIN I (HIGH SENSITIVITY)
Troponin I (High Sensitivity): 9 ng/L (ref <18)
Troponin I (High Sensitivity): 9 ng/L (ref <18)

## 2023-01-24 LAB — URINALYSIS, ROUTINE W REFLEX MICROSCOPIC
Bilirubin Urine: NEGATIVE
Glucose, UA: NEGATIVE mg/dL
Hgb urine dipstick: NEGATIVE
Ketones, ur: NEGATIVE mg/dL
Nitrite: NEGATIVE
Protein, ur: 100 mg/dL — AB
Specific Gravity, Urine: 1.013 (ref 1.005–1.030)
WBC, UA: >50 WBC/hpf (ref 0–5)
pH: 8 (ref 5.0–8.0)

## 2023-01-24 LAB — COMPREHENSIVE METABOLIC PANEL
ALT: 14 U/L (ref 0–44)
AST: 20 U/L (ref 15–41)
Albumin: 3 g/dL — ABNORMAL LOW (ref 3.5–5.0)
Alkaline Phosphatase: 41 U/L (ref 38–126)
Anion gap: 11 (ref 5–15)
BUN: 35 mg/dL — ABNORMAL HIGH (ref 8–23)
CO2: 25 mmol/L (ref 22–32)
Calcium: 9.4 mg/dL (ref 8.9–10.3)
Chloride: 99 mmol/L (ref 98–111)
Creatinine, Ser: 1.38 mg/dL — ABNORMAL HIGH (ref 0.44–1.00)
GFR, Estimated: 36 mL/min — ABNORMAL LOW (ref >60)
Glucose, Bld: 198 mg/dL — ABNORMAL HIGH (ref 70–99)
Potassium: 4.6 mmol/L (ref 3.5–5.1)
Sodium: 135 mmol/L (ref 135–145)
Total Bilirubin: 0.9 mg/dL (ref 0.3–1.2)
Total Protein: 5.9 g/dL — ABNORMAL LOW (ref 6.5–8.1)

## 2023-01-24 MED ORDER — FOSFOMYCIN TROMETHAMINE 3 G PO PACK
3.0000 g | PACK | Freq: Once | ORAL | Status: AC
  Administered 2023-01-24: 3 g via ORAL
  Filled 2023-01-24: qty 3

## 2023-01-24 MED ORDER — SODIUM CHLORIDE 0.9 % IV BOLUS
500.0000 mL | Freq: Once | INTRAVENOUS | Status: AC
  Administered 2023-01-24: 500 mL via INTRAVENOUS

## 2023-01-24 NOTE — ED Triage Notes (Signed)
EMS states that pt c/o leg pain. Denies any recent falls.

## 2023-01-24 NOTE — Discharge Instructions (Signed)
You were given fosfomycin.  The urine culture could be followed and treated more as needed.

## 2023-01-24 NOTE — ED Notes (Signed)
SBAR to staff at [facility] place

## 2023-01-24 NOTE — ED Triage Notes (Signed)
Pt BIB [facility] from [facility] after being found unresponsive in her wheelchair per Estill Bamberg at facility. Pt was sternal rubbed and groaned to that, otherwise was not aware of situation.  Facility states pt is normally A&O and able to stand and pivot. Pt confused at this time.   Pt is hard of hearing, difficulty seeing, has a DNR.

## 2023-01-24 NOTE — ED Provider Notes (Addendum)
[facility] Provider Note   CSN: [id number] Arrival date & time: 01/24/23  1102     History  Chief Complaint  Patient presents with   Altered Mental Status    Wanda Shields is a 89 y.o. female.   Altered Mental Status Patient with mental status change.  Reportedly found unresponsive in wheelchair at nursing home.  Sternal rub.  Some confusion at this time.  Recent admission in the hospital with hyponatremia and some volume overload.  Reportedly now at [facility] under the nursing side.  Patient is a DNR.  Patient came with daughter who gave history.  Mental status reportedly improving since she was at the nursing home.    Past Medical History:  Diagnosis Date   Arthritis    Carpal tunnel syndrome    Hyponatremia    Macular degeneration    RSV (respiratory syncytial virus infection)    Type 2 diabetes mellitus ([place])    UTI (urinary tract infection)     Home Medications Prior to Admission medications   Medication Sig Start Date End Date Taking? Authorizing Provider  acetaminophen (ACETAMINOPHEN 8 HOUR) 650 MG CR tablet Take 650 mg by mouth every 6 (six) hours. DX: Polyosteoarthritis, unspecified   Yes [provider]  alendronate (FOSAMAX) 70 MG tablet Take 1 tablet (70 mg total) by mouth once a week. Sunday 01/01/23  Yes Gerlene Fee, NP  Aspirin 81 MG CAPS Take 1 capsule by mouth daily.   Yes [provider]  Janne Lab Oil [facility]) OINT Apply 1 Application topically as directed. Every Shift Day, Evening, Night   Yes [provider]  Calcium Carb-Cholecalciferol (CALCIUM 600+D3 PO) Take 1 tablet by mouth 2 (two) times daily.   Yes [provider]  celecoxib (CELEBREX) 200 MG capsule Take 1 capsule (200 mg total) by mouth daily. 01/01/23  Yes Gerlene Fee, NP  Cholecalciferol (VITAMIN D3) 10 MCG (400 UNIT) tablet Take 400 Units by mouth daily. 02/11/22  Yes [provider]  diclofenac Sodium (VOLTAREN ARTHRITIS PAIN) 1 % GEL Apply 2 g topically 4 (four) times daily. 01/01/23  Yes Gerlene Fee, NP  docusate sodium (COLACE) 100 MG capsule Take 100 mg by mouth every evening.   Yes [provider]  insulin glargine (LANTUS SOLOSTAR) 100 UNIT/ML Solostar Pen Inject 10 Units into the skin at bedtime. 01/01/23  Yes Gerlene Fee, NP  metFORMIN (GLUCOPHAGE-XR) 500 MG 24 hr tablet Take 1,000 mg by mouth 2 (two) times daily. 01/21/23  Yes [provider]  miconazole (MICRO GUARD) 2 % powder Apply topically. apply powder under (R) breast fungal rash q shift x 14 days Every Shift   Yes [provider]  Multiple Vitamins-Minerals (PRESERVISION AREDS PO) Take 1 tablet by mouth 2 (two) times daily.   Yes [provider]  ondansetron (ZOFRAN-ODT) 8 MG disintegrating tablet Take 8 mg by mouth 3 (three) times daily as needed for nausea or vomiting.   Yes [provider]  polyethylene glycol (MIRALAX / GLYCOLAX) 17 g packet Take 17 g by mouth daily as needed for mild constipation or moderate constipation. 12/16/22  Yes Pokhrel, Laxman, MD  sitaGLIPtin (JANUVIA) 25 MG tablet Take 1 tablet (25 mg total) by mouth daily. 01/01/23  Yes Gerlene Fee, NP  sodium chloride 1 g tablet Take 1 tablet (1 g total) by mouth 3 (three) times daily with meals. DX: Hypo-osmolality and hyponatremia 01/08/23  Yes Maylene Roes,  Anderson Malta, DO  vitamin B-12 (CYANOCOBALAMIN) 500 MCG tablet Take 500 mcg by mouth daily.   Yes [provider]  Insulin Pen Needle (BD AUTOSHIELD DUO) 30G X 5 MM MISC 1 Needle by Does not apply route as directed. with insulin    [provider]      Allergies    Atropine, Demerol [meperidine hcl], and Penicillins    Review of Systems   Review of Systems  Physical Exam Updated Vital Signs BP (!) 142/72 (BP Location: Right Arm)   Pulse 67   Temp 97.7 F (36.5 C) (Oral)   Resp 20   SpO2 98%  Physical Exam Vitals and  nursing note reviewed.  Abdominal:     Tenderness: There is no abdominal tenderness.  Musculoskeletal:        General: No tenderness.     Cervical back: Neck supple.  Neurological:     Mental Status: She is alert. Mental status is at baseline.     ED Results / Procedures / Treatments   Labs (all labs ordered are listed, but only abnormal results are displayed) Labs Reviewed  COMPREHENSIVE METABOLIC PANEL - Abnormal; Notable for the following components:      Result Value   Glucose, Bld 198 (*)    BUN 35 (*)    Creatinine, Ser 1.38 (*)    Total Protein 5.9 (*)    Albumin 3.0 (*)    GFR, Estimated 36 (*)    All other components within normal limits  CBC WITH DIFFERENTIAL/PLATELET - Abnormal; Notable for the following components:   WBC 14.2 (*)    RBC 3.30 (*)    Hemoglobin 10.1 (*)    HCT 31.5 (*)    Neutro Abs 10.3 (*)    Monocytes Absolute 1.1 (*)    All other components within normal limits  URINALYSIS, ROUTINE W REFLEX MICROSCOPIC  TROPONIN I (HIGH SENSITIVITY)  TROPONIN I (HIGH SENSITIVITY)    EKG EKG Interpretation  Date/Time:  Saturday January 24 2023 11:17:35 EST Ventricular Rate:  73 PR Interval:  208 QRS Duration: 104 QT Interval:  399 QTC Calculation: 440 R Axis:   0 Text Interpretation: Sinus rhythm Low voltage, extremity leads Confirmed by Davonna Belling [phone]) on 01/24/2023 2:33:19 PM  Radiology No results found.  Procedures Procedures    Medications Ordered in ED Medications  sodium chloride 0.9 % bolus 500 mL (500 mLs Intravenous New Bag/Given 01/24/23 1421)    ED Course/ Medical Decision Making/ A&P                             Medical Decision Making Amount and/or Complexity of Data Reviewed Labs: ordered.  Risk Prescription drug management.   Patient presented with mental status change.  Improving however.  Had been unresponsive.  Now awake and talking.  Differential diagnosis includes infection dehydration and other  metabolic abnormalities.  No trauma.  Will get basic blood work.  Hemoglobin slightly higher than before and white count also mildly elevated.  Creatinine also mildly elevated.  Will give fluid bolus.  Mental status improving.  However will get urinalysis also since had recent UTI.  Discussed with patient daughter hopefully should be able to go back to the nursing home.  Back towards baseline.   Care turned over to Dr Sabra Heck  Urine showed likely infection.  Reviewed previous culture.  Did have some resistances but will treat with single dose of fosfomycin.  Culture can be  followed as an outpatient.  However appears stable to go to home after IV fluids.        Final Clinical Impression(s) / ED Diagnoses Final diagnoses:  Altered mental status, unspecified altered mental status type  Dehydration    Rx / DC Orders ED Discharge Orders     None         Davonna Belling, MD 01/24/23 1504    Davonna Belling, MD 01/24/23 [phone]

## 2023-01-26 ENCOUNTER — Encounter: Payer: Self-pay | Admitting: Adult Health

## 2023-01-26 ENCOUNTER — Non-Acute Institutional Stay (SKILLED_NURSING_FACILITY): Payer: Medicare Other | Admitting: Adult Health

## 2023-01-26 DIAGNOSIS — I700 Atherosclerosis of aorta: Secondary | ICD-10-CM

## 2023-01-26 DIAGNOSIS — Z8673 Personal history of transient ischemic attack (TIA), and cerebral infarction without residual deficits: Secondary | ICD-10-CM

## 2023-01-26 DIAGNOSIS — R1312 Dysphagia, oropharyngeal phase: Secondary | ICD-10-CM | POA: Diagnosis not present

## 2023-01-26 DIAGNOSIS — M6281 Muscle weakness (generalized): Secondary | ICD-10-CM | POA: Diagnosis not present

## 2023-01-26 DIAGNOSIS — R2681 Unsteadiness on feet: Secondary | ICD-10-CM | POA: Diagnosis not present

## 2023-01-26 NOTE — Progress Notes (Unsigned)
Location:  [facility]  Room Number: 151 Place of Service:  SNF (31)   CODE STATUS: dnr   Allergies  Allergen Reactions   Atropine    Demerol [Meperidine Hcl]    Penicillins     Chief Complaint  Patient presents with   Acute Visit    Follow up ED     HPI:  Yesterday she was taken to the ED due to being unresponsive. Upon arrival to the ED she was more responsive and talking. Her urine did return positive for possible infection. She has returned. She tells me that she is tired today. She denies any pain; changes in appetite.   Past Medical History:  Diagnosis Date   Arthritis    Carpal tunnel syndrome    Hyponatremia    Macular degeneration    RSV (respiratory syncytial virus infection)    Type 2 diabetes mellitus ([place])    UTI (urinary tract infection)     Past Surgical History:  Procedure Laterality Date   ABDOMINAL HYSTERECTOMY     APPENDECTOMY     BACK SURGERY     CHOLECYSTECTOMY     HIP SURGERY     TONSILLECTOMY      Social History   Socioeconomic History   Marital status: Widowed    Spouse name: Not on file   Number of children: Not on file   Years of education: Not on file   Highest education level: Not on file  Occupational History   Not on file  Tobacco Use   Smoking status: Never   Smokeless tobacco: Never  Vaping Use   Vaping Use: Never used  Substance and Sexual Activity   Alcohol use: Never   Drug use: Never   Sexual activity: Not on file  Other Topics Concern   Not on file  Social History Narrative   Not on file   Social Determinants of Health   Financial Resource Strain: Not on file  Food Insecurity: No Food Insecurity (01/06/2023)   Hunger Vital Sign    Worried About Running Out of Food in the Last Year: Never true    Ran Out of Food in the Last Year: Never true  Recent Concern: Food Insecurity - Food Insecurity Present (12/12/2022)   Hunger Vital Sign    Worried About Running Out of Food in the Last  Year: Never true    [place] in the Last Year: Sometimes true  Transportation Needs: No Transportation Needs (01/06/2023)   PRAPARE - Hydrologist (Medical): No    Lack of Transportation (Non-Medical): No  Physical Activity: Not on file  Stress: Not on file  Social Connections: Not on file  Intimate Partner Violence: Not At Risk (01/06/2023)   Humiliation, Afraid, Rape, and Kick questionnaire    Fear of Current or Ex-Partner: No    Emotionally Abused: No    Physically Abused: No    Sexually Abused: No   No family history on file.    VITAL SIGNS BP (!) 140/68   Pulse 78   Temp (!) 97.3 F (36.3 C)   Resp 20   Ht 5' 6"$  (1.676 m)   Wt 138 lb (62.6 kg)   BMI 22.27 kg/m   Outpatient Encounter Medications as of 01/26/2023  Medication Sig   acetaminophen (ACETAMINOPHEN 8 HOUR) 650 MG CR tablet Take 650 mg by mouth every 6 (six) hours. DX: Polyosteoarthritis, unspecified   alendronate (FOSAMAX) 70 MG tablet  Take 1 tablet (70 mg total) by mouth once a week. Sunday   Aspirin 81 MG CAPS Take 1 capsule by mouth daily.   Balsam [place]-Castor Oil (VENELEX) OINT Apply 1 Application topically as directed. Every Shift Day, Evening, Night   Calcium Carb-Cholecalciferol (CALCIUM 600+D3 PO) Take 1 tablet by mouth 2 (two) times daily.   celecoxib (CELEBREX) 200 MG capsule Take 1 capsule (200 mg total) by mouth daily.   Cholecalciferol (VITAMIN D3) 10 MCG (400 UNIT) tablet Take 400 Units by mouth daily.   diclofenac Sodium (VOLTAREN ARTHRITIS PAIN) 1 % GEL Apply 2 g topically 4 (four) times daily.   docusate sodium (COLACE) 100 MG capsule Take 100 mg by mouth every evening.   insulin glargine (LANTUS SOLOSTAR) 100 UNIT/ML Solostar Pen Inject 10 Units into the skin at bedtime.   Insulin Pen Needle (BD AUTOSHIELD DUO) 30G X 5 MM MISC 1 Needle by Does not apply route as directed. with insulin   metFORMIN (GLUCOPHAGE-XR) 500 MG 24 hr tablet Take 1,000 mg by mouth 2  (two) times daily.   miconazole (MICRO GUARD) 2 % powder Apply topically. apply powder under (R) breast fungal rash q shift x 14 days Every Shift   Multiple Vitamins-Minerals (PRESERVISION AREDS PO) Take 1 tablet by mouth 2 (two) times daily.   ondansetron (ZOFRAN-ODT) 8 MG disintegrating tablet Take 8 mg by mouth 3 (three) times daily as needed for nausea or vomiting.   polyethylene glycol (MIRALAX / GLYCOLAX) 17 g packet Take 17 g by mouth daily as needed for mild constipation or moderate constipation.   sitaGLIPtin (JANUVIA) 25 MG tablet Take 1 tablet (25 mg total) by mouth daily.   sodium chloride 1 g tablet Take 1 tablet (1 g total) by mouth 3 (three) times daily with meals. DX: Hypo-osmolality and hyponatremia   vitamin B-12 (CYANOCOBALAMIN) 500 MCG tablet Take 500 mcg by mouth daily.   No facility-administered encounter medications on file as of 01/26/2023.     SIGNIFICANT DIAGNOSTIC EXAMS   LABS REVIEWED: PREVIOUS   12-11-22: wbc 8.6; hgb 11.1; hct 34.1; mcv 93.9 plt 178; glucose 270; bun 14; creat 0.82; k+ 4.0; na++ 126; ca 8.7; gfr >60; hgb A1c 7.8; urine culture: e-coli 12-29-22: glucose 318; bun 19; creat 0.99; k+ 4.6; na++ 124; ca 8.4; gfr 54 01-05-23: wbc 9.0; hgb 9.4; hct 28.5; mcv 93.4 plt 244; glucose 179; bun 24; creat 0.97; k+ 4.9; na++ 123; ca 8.8; gfr 56; protein 5.4; albumin 2.8; mag 1.5; urine culture: multiple  01-06-23; tsh 2.408 vitamin B1: 80.2 01-07-23; glucose 188; bun 16; creat 0.92; k+ 3.7; na++ 130; ca 8.6 gfr 59 01-08-23: wbc 9.5; hgb 9.7; hct 29.5; mcv 93.7 plt 260; glucose 114; bun 13; creat 0.81; k+ 3.4; na++ 131; ca 8.3; gfr 60; mag 1.8 AM cortisol 13.7; chol 171; ldl 86; trig 69 hdl 71 01-10-23; wbc 7.7; hgb 9.7; hct 29.8; mcv 94.0 plt 247; glucose 145; bun 15; creat 0.78; k+ 3.6; na++ 131; ca 8.0; gfr >60  TODAY  01-24-23: wbc 14.2; hgb 10.1; hct 31.5; mcv 95.5 plt 281; glucose 198; bun 35; creat 1.38; k+ 4.6; na++ 135; ca 9.4 gfr 36; protein 5.9; albumin  3.0 UA+   Review of Systems  Constitutional:  Positive for malaise/fatigue.  Respiratory:  Negative for cough and shortness of breath.   Cardiovascular:  Negative for chest pain, palpitations and leg swelling.  Gastrointestinal:  Negative for abdominal pain, constipation and heartburn.  Musculoskeletal:  Negative for back pain,  joint pain and myalgias.  Skin: Negative.   Neurological:  Negative for dizziness.  Psychiatric/Behavioral:  The patient is not nervous/anxious.    Physical Exam Constitutional:      General: She is not in acute distress.    Appearance: She is well-developed. She is not diaphoretic.  Neck:     Thyroid: No thyromegaly.  Cardiovascular:     Rate and Rhythm: Normal rate and regular rhythm.     Pulses: Normal pulses.     Heart sounds: Normal heart sounds.  Pulmonary:     Effort: Pulmonary effort is normal. No respiratory distress.     Breath sounds: Normal breath sounds.  Abdominal:     General: Bowel sounds are normal. There is no distension.     Palpations: Abdomen is soft.     Tenderness: There is no abdominal tenderness.  Musculoskeletal:        General: Normal range of motion.     Cervical back: Neck supple.     Right lower leg: No edema.     Left lower leg: No edema.  Lymphadenopathy:     Cervical: No cervical adenopathy.  Skin:    General: Skin is warm and dry.  Neurological:     Mental Status: She is alert. Mental status is at baseline.  Psychiatric:        Mood and Affect: Mood normal.       ASSESSMENT/ PLAN:  TODAY  History of CVA Aortic atherosclerosis  Will not make any changes at this time. Will continue to monitor her status.    Ok Edwards NP [facility]   call [phone]

## 2023-01-27 DIAGNOSIS — R2681 Unsteadiness on feet: Secondary | ICD-10-CM | POA: Diagnosis not present

## 2023-01-27 DIAGNOSIS — R1312 Dysphagia, oropharyngeal phase: Secondary | ICD-10-CM | POA: Diagnosis not present

## 2023-01-27 DIAGNOSIS — M6281 Muscle weakness (generalized): Secondary | ICD-10-CM | POA: Diagnosis not present

## 2023-01-28 ENCOUNTER — Other Ambulatory Visit: Payer: Self-pay | Admitting: *Deleted

## 2023-01-28 DIAGNOSIS — M6281 Muscle weakness (generalized): Secondary | ICD-10-CM | POA: Diagnosis not present

## 2023-01-28 DIAGNOSIS — R1312 Dysphagia, oropharyngeal phase: Secondary | ICD-10-CM | POA: Diagnosis not present

## 2023-01-28 DIAGNOSIS — R2681 Unsteadiness on feet: Secondary | ICD-10-CM | POA: Diagnosis not present

## 2023-01-28 LAB — URINE CULTURE: Culture: >=100000 — AB

## 2023-01-28 NOTE — Patient Outreach (Signed)
[facility] Post- Acute Care Coordinator follow up. Per [facility] Mrs. Taing resides in [facility] skilled nursing facility. Screening for potential care coordination services as benefit of health plan and Primary Care Provider.  Update received from [place], [place] social worker. Mrs. Stanley has transitioned to long term care.   No identifiable [facility] care coordination needs.   Marthenia Rolling, MSN, RN,BSN [place] Acute Care Coordinator [phone] (Direct dial)

## 2023-01-29 ENCOUNTER — Telehealth (HOSPITAL_BASED_OUTPATIENT_CLINIC_OR_DEPARTMENT_OTHER): Payer: Self-pay | Admitting: *Deleted

## 2023-01-29 ENCOUNTER — Other Ambulatory Visit (HOSPITAL_COMMUNITY)
Admission: RE | Admit: 2023-01-29 | Discharge: 2023-01-29 | Disposition: A | Discharge status: Home / Self Care | Payer: Medicare Other | Source: Skilled Nursing Facility | Attending: Adult Health | Admitting: Adult Health

## 2023-01-29 DIAGNOSIS — E539 Vitamin B deficiency, unspecified: Secondary | ICD-10-CM | POA: Diagnosis not present

## 2023-01-29 DIAGNOSIS — R2681 Unsteadiness on feet: Secondary | ICD-10-CM | POA: Diagnosis not present

## 2023-01-29 DIAGNOSIS — R1312 Dysphagia, oropharyngeal phase: Secondary | ICD-10-CM | POA: Diagnosis not present

## 2023-01-29 DIAGNOSIS — M6281 Muscle weakness (generalized): Secondary | ICD-10-CM | POA: Diagnosis not present

## 2023-01-29 LAB — VITAMIN B12: Vitamin B-12: 1174 pg/mL — ABNORMAL HIGH (ref 180–914)

## 2023-01-29 NOTE — Telephone Encounter (Signed)
Post ED Visit - Positive Culture Follow-up  Culture report reviewed by antimicrobial stewardship pharmacist: [place] Team []$  Elenor Quinones, Pharm.D. []$  Heide Guile, Pharm.D., BCPS AQ-ID []$  Parks Neptune, Pharm.D., BCPS []$  Alycia Rossetti, Pharm.D., BCPS []$  [place], Pharm.D., BCPS, AAHIVP []$  Legrand Como, Pharm.D., BCPS, AAHIVP []$  Salome Arnt, PharmD, BCPS []$  Johnnette Gourd, PharmD, BCPS []$  Hughes Better, PharmD, BCPS []$  Leeroy Cha, PharmD []$  Laqueta Linden, PharmD, BCPS []$  Albertina Parr, PharmD  [place] Team []$  Leodis Sias, PharmD []$  Lindell Spar, PharmD []$  Royetta Asal, PharmD []$  Graylin Shiver, Rph []$  Rema Fendt) Glennon Mac, PharmD []$  Arlyn Dunning, PharmD []$  Netta Cedars, PharmD []$  Dia Sitter, PharmD []$  Leone Haven, PharmD []$  Gretta Arab, PharmD []$  Theodis Shove, PharmD []$  Peggyann Juba, PharmD []$  Reuel Boom, PharmD   Positive urine culture Treated with Fosfomycin, organism sensitive to the same and no further patient follow-up is required at this time.  Arturo Morton, PharmD  Harlon Flor Talley 01/29/2023, 10:52 AM

## 2023-01-30 DIAGNOSIS — R1312 Dysphagia, oropharyngeal phase: Secondary | ICD-10-CM | POA: Diagnosis not present

## 2023-01-30 DIAGNOSIS — M6281 Muscle weakness (generalized): Secondary | ICD-10-CM | POA: Diagnosis not present

## 2023-01-30 DIAGNOSIS — R2681 Unsteadiness on feet: Secondary | ICD-10-CM | POA: Diagnosis not present

## 2023-01-31 DIAGNOSIS — M6281 Muscle weakness (generalized): Secondary | ICD-10-CM | POA: Diagnosis not present

## 2023-01-31 DIAGNOSIS — R2681 Unsteadiness on feet: Secondary | ICD-10-CM | POA: Diagnosis not present

## 2023-01-31 DIAGNOSIS — R1312 Dysphagia, oropharyngeal phase: Secondary | ICD-10-CM | POA: Diagnosis not present

## 2023-02-02 DIAGNOSIS — M6281 Muscle weakness (generalized): Secondary | ICD-10-CM | POA: Diagnosis not present

## 2023-02-02 DIAGNOSIS — R2681 Unsteadiness on feet: Secondary | ICD-10-CM | POA: Diagnosis not present

## 2023-02-02 DIAGNOSIS — R1312 Dysphagia, oropharyngeal phase: Secondary | ICD-10-CM | POA: Diagnosis not present

## 2023-02-03 ENCOUNTER — Other Ambulatory Visit (HOSPITAL_COMMUNITY)
Admission: RE | Admit: 2023-02-03 | Discharge: 2023-02-03 | Disposition: A | Discharge status: Home / Self Care | Payer: Medicare Other | Source: Skilled Nursing Facility | Attending: Adult Health | Admitting: Adult Health

## 2023-02-03 DIAGNOSIS — N390 Urinary tract infection, site not specified: Secondary | ICD-10-CM | POA: Diagnosis not present

## 2023-02-03 DIAGNOSIS — R1312 Dysphagia, oropharyngeal phase: Secondary | ICD-10-CM | POA: Diagnosis not present

## 2023-02-03 DIAGNOSIS — R2681 Unsteadiness on feet: Secondary | ICD-10-CM | POA: Diagnosis not present

## 2023-02-03 DIAGNOSIS — M6281 Muscle weakness (generalized): Secondary | ICD-10-CM | POA: Diagnosis not present

## 2023-02-03 LAB — URINALYSIS, ROUTINE W REFLEX MICROSCOPIC
Bilirubin Urine: NEGATIVE
Glucose, UA: >1000 mg/dL — AB
Ketones, ur: 15 mg/dL — AB
Leukocytes,Ua: NEGATIVE
Nitrite: NEGATIVE
Protein, ur: 300 mg/dL — AB
RBC / HPF: >50 RBC/hpf (ref 0–5)
Specific Gravity, Urine: 1.02 (ref 1.005–1.030)
WBC, UA: >50 WBC/hpf (ref 0–5)
pH: 6 (ref 5.0–8.0)

## 2023-02-04 ENCOUNTER — Non-Acute Institutional Stay (SKILLED_NURSING_FACILITY): Payer: Medicare Other | Admitting: Internal Medicine

## 2023-02-04 ENCOUNTER — Encounter (HOSPITAL_COMMUNITY): Payer: Self-pay | Admitting: *Deleted

## 2023-02-04 ENCOUNTER — Other Ambulatory Visit: Payer: Self-pay

## 2023-02-04 ENCOUNTER — Encounter: Payer: Self-pay | Admitting: Internal Medicine

## 2023-02-04 ENCOUNTER — Emergency Department (HOSPITAL_COMMUNITY)
Admission: EM | Admit: 2023-02-04 | Discharge: 2023-02-04 | Disposition: A | Discharge status: Skilled Nursing Facility | Payer: Medicare Other | Attending: Emergency Medicine | Admitting: Emergency Medicine

## 2023-02-04 ENCOUNTER — Emergency Department (HOSPITAL_COMMUNITY): Payer: Medicare Other

## 2023-02-04 DIAGNOSIS — Z794 Long term (current) use of insulin: Secondary | ICD-10-CM | POA: Diagnosis not present

## 2023-02-04 DIAGNOSIS — E119 Type 2 diabetes mellitus without complications: Secondary | ICD-10-CM | POA: Diagnosis not present

## 2023-02-04 DIAGNOSIS — I10 Essential (primary) hypertension: Secondary | ICD-10-CM | POA: Diagnosis not present

## 2023-02-04 DIAGNOSIS — S098XXA Other specified injuries of head, initial encounter: Secondary | ICD-10-CM

## 2023-02-04 DIAGNOSIS — Z7982 Long term (current) use of aspirin: Secondary | ICD-10-CM | POA: Diagnosis not present

## 2023-02-04 DIAGNOSIS — R1312 Dysphagia, oropharyngeal phase: Secondary | ICD-10-CM | POA: Diagnosis not present

## 2023-02-04 DIAGNOSIS — S72002A Fracture of unspecified part of neck of left femur, initial encounter for closed fracture: Secondary | ICD-10-CM | POA: Diagnosis not present

## 2023-02-04 DIAGNOSIS — Z043 Encounter for examination and observation following other accident: Secondary | ICD-10-CM | POA: Diagnosis not present

## 2023-02-04 DIAGNOSIS — Y92129 Unspecified place in nursing home as the place of occurrence of the external cause: Secondary | ICD-10-CM | POA: Diagnosis not present

## 2023-02-04 DIAGNOSIS — D72829 Elevated white blood cell count, unspecified: Secondary | ICD-10-CM | POA: Diagnosis not present

## 2023-02-04 DIAGNOSIS — M25551 Pain in right hip: Secondary | ICD-10-CM | POA: Insufficient documentation

## 2023-02-04 DIAGNOSIS — S0990XA Unspecified injury of head, initial encounter: Secondary | ICD-10-CM | POA: Diagnosis not present

## 2023-02-04 DIAGNOSIS — S0081XA Abrasion of other part of head, initial encounter: Secondary | ICD-10-CM | POA: Diagnosis present

## 2023-02-04 DIAGNOSIS — N1832 Chronic kidney disease, stage 3b: Secondary | ICD-10-CM | POA: Diagnosis not present

## 2023-02-04 DIAGNOSIS — S0011XA Contusion of right eyelid and periocular area, initial encounter: Secondary | ICD-10-CM | POA: Insufficient documentation

## 2023-02-04 DIAGNOSIS — R2681 Unsteadiness on feet: Secondary | ICD-10-CM | POA: Diagnosis not present

## 2023-02-04 DIAGNOSIS — M6281 Muscle weakness (generalized): Secondary | ICD-10-CM | POA: Diagnosis not present

## 2023-02-04 DIAGNOSIS — Z7984 Long term (current) use of oral hypoglycemic drugs: Secondary | ICD-10-CM | POA: Insufficient documentation

## 2023-02-04 DIAGNOSIS — Z8673 Personal history of transient ischemic attack (TIA), and cerebral infarction without residual deficits: Secondary | ICD-10-CM | POA: Insufficient documentation

## 2023-02-04 DIAGNOSIS — R52 Pain, unspecified: Secondary | ICD-10-CM | POA: Diagnosis not present

## 2023-02-04 DIAGNOSIS — E1122 Type 2 diabetes mellitus with diabetic chronic kidney disease: Secondary | ICD-10-CM

## 2023-02-04 DIAGNOSIS — M25552 Pain in left hip: Secondary | ICD-10-CM | POA: Insufficient documentation

## 2023-02-04 DIAGNOSIS — Z743 Need for continuous supervision: Secondary | ICD-10-CM | POA: Diagnosis not present

## 2023-02-04 DIAGNOSIS — R627 Adult failure to thrive: Secondary | ICD-10-CM

## 2023-02-04 DIAGNOSIS — R739 Hyperglycemia, unspecified: Secondary | ICD-10-CM | POA: Diagnosis not present

## 2023-02-04 DIAGNOSIS — W19XXXA Unspecified fall, initial encounter: Secondary | ICD-10-CM | POA: Diagnosis not present

## 2023-02-04 LAB — CBC WITH DIFFERENTIAL/PLATELET
Abs Immature Granulocytes: 0.05 10*3/uL (ref 0.00–0.07)
Basophils Absolute: 0 10*3/uL (ref 0.0–0.1)
Basophils Relative: 0 %
Eosinophils Absolute: 0.1 10*3/uL (ref 0.0–0.5)
Eosinophils Relative: 0 %
HCT: 33.6 % — ABNORMAL LOW (ref 36.0–46.0)
Hemoglobin: 10.8 g/dL — ABNORMAL LOW (ref 12.0–15.0)
Immature Granulocytes: 0 %
Lymphocytes Relative: 25 %
Lymphs Abs: 3.2 10*3/uL (ref 0.7–4.0)
MCH: 30.5 pg (ref 26.0–34.0)
MCHC: 32.1 g/dL (ref 30.0–36.0)
MCV: 94.9 fL (ref 80.0–100.0)
Monocytes Absolute: 0.7 10*3/uL (ref 0.1–1.0)
Monocytes Relative: 6 %
Neutro Abs: 8.8 10*3/uL — ABNORMAL HIGH (ref 1.7–7.7)
Neutrophils Relative %: 69 %
Platelets: 248 10*3/uL (ref 150–400)
RBC: 3.54 MIL/uL — ABNORMAL LOW (ref 3.87–5.11)
RDW: 13.6 % (ref 11.5–15.5)
WBC: 12.8 10*3/uL — ABNORMAL HIGH (ref 4.0–10.5)
nRBC: 0 % (ref 0.0–0.2)

## 2023-02-04 LAB — BASIC METABOLIC PANEL
Anion gap: 10 (ref 5–15)
BUN: 29 mg/dL — ABNORMAL HIGH (ref 8–23)
CO2: 24 mmol/L (ref 22–32)
Calcium: 9.9 mg/dL (ref 8.9–10.3)
Chloride: 98 mmol/L (ref 98–111)
Creatinine, Ser: 1.12 mg/dL — ABNORMAL HIGH (ref 0.44–1.00)
GFR, Estimated: 47 mL/min — ABNORMAL LOW (ref >60)
Glucose, Bld: 193 mg/dL — ABNORMAL HIGH (ref 70–99)
Potassium: 4 mmol/L (ref 3.5–5.1)
Sodium: 132 mmol/L — ABNORMAL LOW (ref 135–145)

## 2023-02-04 NOTE — ED Provider Notes (Signed)
[facility] Provider Note   CSN: [id number] Arrival date & time: 02/04/23  1230     History  Chief Complaint  Patient presents with   Wanda Shields    Wanda Shields is a 89 y.o. female with a history including hyponatremia, type 2 diabetes, hypertension, history of CVA presenting from a local nursing home where she had an unwitnessed fall.  She is wheelchair-bound at baseline but was found on the floor prior to arrival.  It is unclear if the patient tried to get out of her wheelchair causing her fall, daughter at the bedside states she had a syncopal event for which she was seen here on February 10, loculating whether she might have syncopized this morning as well.  Patient is unable to say whether that could have occurred.  She does not recall falling.  She did hit her head and has abrasions on her right lateral temple and periorbital area, another abrasion on her right knee.  Her only complaint of pain is in her bilateral hips.  She denies headache, upper extremity pain, also denies nausea or vomiting, chest pain, shortness of breath or abdominal pain.  Daughter at bedside expresses concern over having more pungent than normal urine, stating prior history of UTIs.  A urine sample was collected last night at the nursing home, preliminary report is negative for obvious UTI, a culture is currently pending.  Her abrasions/skin tears.  Were dressed by the nursing staff at the home with Steri-Strips prior to arrival. The history is provided by the patient.       Home Medications Prior to Admission medications   Medication Sig Start Date End Date Taking? Authorizing Provider  acetaminophen (ACETAMINOPHEN 8 HOUR) 650 MG CR tablet Take 650 mg by mouth every 6 (six) hours. DX: Polyosteoarthritis, unspecified   Yes [provider]  alendronate (FOSAMAX) 70 MG tablet Take 1 tablet (70 mg total) by mouth once a week. Sunday 01/01/23  Yes Gerlene Fee, NP  Aspirin 81 MG CAPS Take 1 capsule by mouth daily.   Yes [provider]  Janne Lab Oil [facility]) OINT Apply 1 Application topically as directed. Every Shift Day, Evening, Night   Yes [provider]  Calcium Carb-Cholecalciferol (CALCIUM 600+D3 PO) Take 1 tablet by mouth 2 (two) times daily.   Yes [provider]  celecoxib (CELEBREX) 200 MG capsule Take 1 capsule (200 mg total) by mouth daily. 01/01/23  Yes Gerlene Fee, NP  Cholecalciferol (VITAMIN D3) 10 MCG (400 UNIT) tablet Take 400 Units by mouth daily. 02/11/22  Yes [provider]  diclofenac Sodium (VOLTAREN ARTHRITIS PAIN) 1 % GEL Apply 2 g topically 4 (four) times daily. 01/01/23  Yes Gerlene Fee, NP  docusate sodium (COLACE) 100 MG capsule Take 100 mg by mouth every evening.   Yes [provider]  insulin glargine (LANTUS SOLOSTAR) 100 UNIT/ML Solostar Pen Inject 10 Units into the skin at bedtime. 01/01/23  Yes Gerlene Fee, NP  lidocaine 4 % Place 1 patch onto the skin in the morning and at bedtime.   Yes [provider]  metFORMIN (GLUCOPHAGE-XR) 500 MG 24 hr tablet Take 500 mg by mouth daily with breakfast. 01/21/23  Yes [provider]  Multiple Vitamins-Minerals (PRESERVISION AREDS PO) Take 1 tablet by mouth 2 (two) times daily.   Yes [provider]  ondansetron (ZOFRAN-ODT) 8 MG disintegrating tablet Take 8 mg by mouth 3 (three) times daily  as needed for nausea or vomiting.   Yes [provider]  polyethylene glycol (MIRALAX / GLYCOLAX) 17 g packet Take 17 g by mouth daily as needed for mild constipation or moderate constipation. 12/16/22  Yes Pokhrel, Laxman, MD  sitaGLIPtin (JANUVIA) 25 MG tablet Take 1 tablet (25 mg total) by mouth daily. 01/01/23  Yes Gerlene Fee, NP  sodium chloride 1 g tablet Take 1 tablet (1 g total) by mouth 3 (three) times daily with meals. DX: Hypo-osmolality and hyponatremia 01/08/23  Yes Dessa Phi, DO  vitamin B-12 (CYANOCOBALAMIN) 500 MCG tablet Take 500 mcg by mouth daily.   Yes [provider]  Insulin Pen Needle (BD AUTOSHIELD DUO) 30G X 5 MM MISC 1 Needle by Does not apply route as directed. with insulin    [provider]      Allergies    Atropine, Demerol [meperidine hcl], and Penicillins    Review of Systems   Review of Systems  Constitutional:  Negative for chills and fever.  HENT:  Negative for congestion and sore throat.   Eyes: Negative.   Respiratory:  Negative for chest tightness and shortness of breath.   Cardiovascular:  Negative for chest pain.  Gastrointestinal:  Negative for abdominal pain, nausea and vomiting.  Genitourinary: Negative.   Musculoskeletal:  Positive for arthralgias. Negative for joint swelling and neck pain.  Skin:  Positive for wound. Negative for rash.  Neurological:  Negative for dizziness, weakness, light-headedness, numbness and headaches.  Psychiatric/Behavioral: Negative.    All other systems reviewed and are negative.   Physical Exam Updated Vital Signs BP (!) 143/82   Pulse 78   Temp 97.8 F (36.6 C) (Oral)   Resp 18   Ht '5\' 6"'$  (1.676 m)   Wt 62.5 kg   SpO2 97%   BMI 22.24 kg/m  Physical Exam Vitals and nursing note reviewed.  Constitutional:      Appearance: She is well-developed.  HENT:     Head: Normocephalic.     Comments: Right periorbital bruising without significant hematoma.  She does have some mild skin tears on her bilateral hands which have been Steri-Stripped.  Another small tear at her right upper tibia area, also Steri-Stripped and hemostatic. Eyes:     Conjunctiva/sclera: Conjunctivae normal.  Cardiovascular:     Rate and Rhythm: Normal rate and regular rhythm.     Heart sounds: Normal heart sounds.  Pulmonary:     Effort: Pulmonary effort is normal.     Breath sounds: Normal breath sounds. No wheezing.  Abdominal:     General: Bowel sounds are normal.     Palpations:  Abdomen is soft.     Tenderness: There is no abdominal tenderness.  Musculoskeletal:        General: Normal range of motion.     Cervical back: Normal range of motion.     Comments: Moves arms without complaint of pain.  Has complaints of pain across to her hips and pelvis, although denies any pain with passive range of motion of her legs.  No external rotation or shortening.  Skin:    General: Skin is warm and dry.  Neurological:     Mental Status: She is alert.     ED Results / Procedures / Treatments   Labs (all labs ordered are listed, but only abnormal results are displayed) Labs Reviewed  CBC WITH DIFFERENTIAL/PLATELET - Abnormal; Notable for the following components:      Result Value   WBC  12.8 (*)    RBC 3.54 (*)    Hemoglobin 10.8 (*)    HCT 33.6 (*)    Neutro Abs 8.8 (*)    All other components within normal limits  BASIC METABOLIC PANEL - Abnormal; Notable for the following components:   Sodium 132 (*)    Glucose, Bld 193 (*)    BUN 29 (*)    Creatinine, Ser 1.12 (*)    GFR, Estimated 47 (*)    All other components within normal limits    EKG None  Radiology CT Head Wo Contrast  Result Date: 02/04/2023 CLINICAL DATA:  Head trauma, minor (Age >= 65y).  Un witnessed fall. EXAM: CT HEAD WITHOUT CONTRAST TECHNIQUE: Contiguous axial images were obtained from the base of the skull through the vertex without intravenous contrast. RADIATION DOSE REDUCTION: This exam was performed according to the departmental dose-optimization program which includes automated exposure control, adjustment of the mA and/or kV according to patient size and/or use of iterative reconstruction technique. COMPARISON:  Head CT and MRI 01/08/2023 FINDINGS: Brain: There is no evidence of an acute infarct, intracranial hemorrhage, mass, midline shift, or extra-axial fluid collection. Small chronic bilateral occipital and right parietal infarcts are unchanged. Small chronic bilateral cerebellar  infarcts were better demonstrated on the prior MRI. Bilateral cerebral white matter hypodensities are unchanged and nonspecific but compatible with mild chronic small vessel ischemic disease. Mild cerebral atrophy is likely within normal limits for age. Vascular: Calcified atherosclerosis at the skull base. No hyperdense vessel. Skull: No acute fracture or suspicious osseous lesion. Sinuses/Orbits: Persistent trace fluid in the right sphenoid sinus and inferior left frontal sinus. Clear mastoid air cells. Bilateral cataract extraction. Other: Small right lateral upper facial hematoma. IMPRESSION: 1. No evidence of acute intracranial abnormality. 2. Small right facial hematoma. 3. Chronic infarcts as above. Electronically Signed   By: Logan Bores M.D.   On: 02/04/2023 14:35   DG HIP UNILAT WITH PELVIS 2-3 VIEWS LEFT  Result Date: 02/04/2023 CLINICAL DATA:  Fall with pain EXAM: DG HIP (WITH OR WITHOUT PELVIS) 2-3V LEFT COMPARISON:  12/15/2022 FINDINGS: No evidence of pelvic fracture. Previous Knowles pins for treatment of femoral neck fracture. No acute left hip fracture. Degenerative change of the left hip. IMPRESSION: No acute or traumatic finding. Previous Knowles pins for treatment of left femoral neck fracture. Electronically Signed   By: Nelson Chimes M.D.   On: 02/04/2023 14:26   DG HIP UNILAT WITH PELVIS 2-3 VIEWS RIGHT  Result Date: 02/04/2023 CLINICAL DATA:  Unwitnessed fall EXAM: DG HIP (WITH OR WITHOUT PELVIS) 2-3V RIGHT COMPARISON:  None Available. FINDINGS: No visible pelvic fracture. No right hip fracture. Previous Knowles pins of the left hip with degenerative changes of the joint. Previous lumbar discectomy and fusion. IMPRESSION: No acute or traumatic finding. Previous Knowles pins of the left hip. Electronically Signed   By: Nelson Chimes M.D.   On: 02/04/2023 14:25    Procedures Procedures    Medications Ordered in ED Medications - No data to display  ED Course/ Medical Decision  Making/ A&P                             Medical Decision Making Patient presenting with a fall out of her wheelchair from the nursing home today.  CT imaging and basic labs are reassuring.  She has no intracranial injuries, no significant electrolyte abnormalities or evidence for dehydration or other concerns.  She  does have a leukocytosis at 12.8 although this is improved from a prior lab from February 10.  At that time she was treated for UTI.  Amount and/or Complexity of Data Reviewed Labs: ordered.    Details: Per above, labs reviewed and discussed with patient and daughter at bedside. Radiology: ordered.    Details: CT and plain film imaging reviewed, agree with interpretations.           Final Clinical Impression(s) / ED Diagnoses Final diagnoses:  Fall, initial encounter  Minor head injury, initial encounter    Rx / DC Orders ED Discharge Orders     None         Landis Martins 02/04/23 1559    Milton Ferguson, MD 02/06/23 1006

## 2023-02-04 NOTE — ED Notes (Signed)
Report given to [facility] nursing staff, informed them we would be transporting her through the tunnel since they did not have staff readily available.

## 2023-02-04 NOTE — ED Triage Notes (Signed)
Pt brought in by [facility] from [facility] for c/o unwitnessed fall  BP 142/80 O2 96% Temp 97.5 P 94 Cbg 404  Pt has skin tears to hands and bilateral legs; pt c/o hip pain to both sides

## 2023-02-04 NOTE — Discharge Instructions (Signed)
As discussed, your imaging studies and your lab tests are stable today.  Keep your skin tears clean and dry and allow the strips to fall away when they are ready.  I would wait for the urine culture collected at your facility to return before deciding whether an antibiotic would be helpful.  Also recommend discussing the vitamin B12 issue with Dr. Quintin Alto.

## 2023-02-04 NOTE — Patient Instructions (Signed)
See assessment and plan under each diagnosis in the problem list and acutely for this visit 

## 2023-02-04 NOTE — Progress Notes (Signed)
NURSING HOME LOCATION:  [facility] ROOM NUMBER:  151 P  CODE STATUS:  DNR  PCP:  Consuello Masse MD  This is a nursing facility follow up visit for specific acute issue of fall with blunt trauma to the right face, and right lower extremity.  Interim medical record and care since last SNF visit was updated with review of diagnostic studies and change in clinical status since last visit were documented.  HPI: I was called to see her emergently after she was found on the floor of her room in the right lateral decubitus position with her torso twisted to the left.  This represents 1 of many falls in the context of adult failure to thrive and generalized weakness aggravated by recent RSV infection for which she was hospitalized.  Since that time she has been maximum assist. The fall was not visualized and the patient is unable to define the circumstances of the fall.  There is no definite neurologic or cardiac prodrome prior to the fall; but again history is not definitely reliable. EMS took her blood sugar and it was 404.  She is on basal insulin 10 units at bedtime, metformin 1000 mg twice daily, and Sitagliptin 25 mg daily.  Most recent A1c was 7.8% on 12/11/2022. She is not on an anticoagulant.  Most recent labs were performed 01/24/2023 and revealed creatinine 1.38 and GFR 36 indicating CKD stage IIIb.  There has been significant decrease in the GFR from values of greater than 60 on 01/16/2023. Most recent H/H was 10.1/31.5 with normochromic, normocytic indices.  Anemia serially is slightly improved.  Review of systems: She validates pain in the right hip as well is in the right superior shin area and right cheek.  Constitutional: No fever, significant weight change Eyes: No vision change Cardiovascular: No chest pain, palpitations, paroxysmal nocturnal dyspnea  Respiratory: No cough, sputum production, hemoptysis, DOE   Neurologic: No dizziness, headache, syncope,  seizures  Physical exam:  Pertinent or positive findings: She was able to give me the month and the year but not the correct day of the month.  She appears her age and chronically ill.  Hair is disheveled.  She is hard of hearing.  Responses are slow.  There is an abrasion over the right cheek with associated bruising.  She has a laceration below the right knee and the upper shin which is Steri-Stripped and an abrasion at the base of the right index finger. She moves all extremities but the left upper extremity is weaker than the right upper extremity.  She describes pain in the right hip with elevation of the right lower extremity. Heart sounds are distant and breath sounds markedly decreased.  She has 1/2+ edema at the ankles.  Pedal pulses are decreased but palpable.  There is hyperpigmented sclerotic skin changes over the shins.  She has interosseous wasting of the hands.  General appearance:  no acute distress, increased work of breathing is present.   Lymphatic: No lymphadenopathy about the head, neck, axilla. Eyes: No conjunctival inflammation or lid edema is present. There is no scleral icterus. Ears:  External ear exam shows no significant lesions or deformities.   Nose:  External nasal examination shows no deformity or inflammation. Nasal mucosa are pink and moist without lesions, exudates Oral exam:  Lips and gums are healthy appearing. There is no oropharyngeal erythema or exudate. Neck:  No thyromegaly, masses, tenderness noted.    Heart:  No gallop, murmur, click, rub .  Lungs:  without wheezes, rhonchi, rales, rubs. Abdomen: Bowel sounds are normal. Abdomen is soft and nontender with no organomegaly, hernias, masses. GU: Deferred  Extremities:  No cyanosis, clubbing  Neurologic exam :Balance, Rhomberg, finger to nose testing could not be completed due to clinical state Skin: Warm & dry w/o tenting.  See summary under each active problem in the Problem List with associated updated  therapeutic plan. Referral to ED is necessary for imaging to rule out significant CNS injury and acute right hip fracture.

## 2023-02-04 NOTE — Assessment & Plan Note (Signed)
On 01/16/2023 GFR was greater than 60; current GFR as of 2/10 is 3. She is on high-dose metformin which will need to be decreased with glucose and BMET monitor. Current A1c indicates adequate control at 7.8%; but stat glucose at the time of the fall & acute injury was 404.  It is anticipated this will be addressed with SSI as an inpatient.

## 2023-02-04 NOTE — Assessment & Plan Note (Signed)
Generalized weakness in the context of recent RSV and faction.  PT/OT states that she is maximum assist for any mobilization.

## 2023-02-05 ENCOUNTER — Non-Acute Institutional Stay (SKILLED_NURSING_FACILITY): Payer: Medicare Other | Admitting: Adult Health

## 2023-02-05 ENCOUNTER — Encounter: Payer: Self-pay | Admitting: Adult Health

## 2023-02-05 DIAGNOSIS — F0150 Vascular dementia without behavioral disturbance: Secondary | ICD-10-CM | POA: Diagnosis not present

## 2023-02-05 DIAGNOSIS — F0390 Unspecified dementia without behavioral disturbance: Secondary | ICD-10-CM | POA: Diagnosis not present

## 2023-02-05 DIAGNOSIS — R2681 Unsteadiness on feet: Secondary | ICD-10-CM | POA: Diagnosis not present

## 2023-02-05 DIAGNOSIS — R1312 Dysphagia, oropharyngeal phase: Secondary | ICD-10-CM | POA: Diagnosis not present

## 2023-02-05 DIAGNOSIS — M6281 Muscle weakness (generalized): Secondary | ICD-10-CM | POA: Diagnosis not present

## 2023-02-05 NOTE — Progress Notes (Signed)
Location:  [place] Room Number: 151 P Place of Service:  SNF (31)   CODE STATUS: DNR  Allergies  Allergen Reactions   Atropine    Demerol [Meperidine Hcl]    Penicillins     Chief Complaint  Patient presents with   Follow-up    ED follow-up     HPI:  She had a fall yesterday landing on her right side. She did have significant pain and was sent to the ED. She was not found to have a fracture and has returned to his facility. She has steri strips above her right eye. She is out of bed today; denies any pain.   Past Medical History:  Diagnosis Date   Arthritis    Carpal tunnel syndrome    Hyponatremia    Macular degeneration    RSV (respiratory syncytial virus infection)    Type 2 diabetes mellitus ([place])    UTI (urinary tract infection)     Past Surgical History:  Procedure Laterality Date   ABDOMINAL HYSTERECTOMY     APPENDECTOMY     BACK SURGERY     CHOLECYSTECTOMY     HIP SURGERY     TONSILLECTOMY      Social History   Socioeconomic History   Marital status: Widowed    Spouse name: Not on file   Number of children: Not on file   Years of education: Not on file   Highest education level: Not on file  Occupational History   Not on file  Tobacco Use   Smoking status: Never   Smokeless tobacco: Never  Vaping Use   Vaping Use: Never used  Substance and Sexual Activity   Alcohol use: Never   Drug use: Never   Sexual activity: Not on file  Other Topics Concern   Not on file  Social History Narrative   Not on file   Social Determinants of Health   Financial Resource Strain: Not on file  Food Insecurity: No Food Insecurity (01/06/2023)   Hunger Vital Sign    Worried About Running Out of Food in the Last Year: Never true    Ran Out of Food in the Last Year: Never true  Recent Concern: Food Insecurity - Food Insecurity Present (12/12/2022)   Hunger Vital Sign    Worried About Running Out of Food in the Last Year: Never true     [place] in the Last Year: Sometimes true  Transportation Needs: No Transportation Needs (01/06/2023)   PRAPARE - Hydrologist (Medical): No    Lack of Transportation (Non-Medical): No  Physical Activity: Not on file  Stress: Not on file  Social Connections: Not on file  Intimate Partner Violence: Not At Risk (01/06/2023)   Humiliation, Afraid, Rape, and Kick questionnaire    Fear of Current or Ex-Partner: No    Emotionally Abused: No    Physically Abused: No    Sexually Abused: No   History reviewed. No pertinent family history.    VITAL SIGNS BP (!) 142/55   Pulse 62   Temp 97.7 F (36.5 C)   Ht '5\' 6"'$  (1.676 m)   Wt 138 lb (62.6 kg)   BMI 22.27 kg/m   Outpatient Encounter Medications as of 02/05/2023  Medication Sig   acetaminophen (ACETAMINOPHEN 8 HOUR) 650 MG CR tablet Take 650 mg by mouth every 6 (six) hours. DX: Polyosteoarthritis, unspecified   Aspirin 81 MG CAPS Take 1 capsule by  mouth daily.   Balsam [place]-Castor Oil (VENELEX) OINT Apply 1 Application topically as directed. Every Shift Day, Evening, Night   Calcium Carb-Cholecalciferol (CALCIUM 600+D3 PO) Take 1 tablet by mouth 2 (two) times daily.   celecoxib (CELEBREX) 200 MG capsule Take 1 capsule (200 mg total) by mouth daily.   Cholecalciferol (VITAMIN D3) 10 MCG (400 UNIT) tablet Take 400 Units by mouth daily.   diclofenac Sodium (VOLTAREN ARTHRITIS PAIN) 1 % GEL Apply 2 g topically 4 (four) times daily.   docusate sodium (COLACE) 100 MG capsule Take 100 mg by mouth every evening.   insulin glargine (LANTUS SOLOSTAR) 100 UNIT/ML Solostar Pen Inject 10 Units into the skin at bedtime.   Insulin Pen Needle (BD AUTOSHIELD DUO) 30G X 5 MM MISC 1 Needle by Does not apply route as directed. with insulin   lidocaine 4 % Place 1 patch onto the skin in the morning and at bedtime.   metFORMIN (GLUCOPHAGE-XR) 500 MG 24 hr tablet Take 500 mg by mouth daily with breakfast.   Multiple  Vitamins-Minerals (PRESERVISION AREDS PO) Take 1 tablet by mouth 2 (two) times daily.   ondansetron (ZOFRAN-ODT) 8 MG disintegrating tablet Take 8 mg by mouth 3 (three) times daily as needed for nausea or vomiting.   polyethylene glycol (MIRALAX / GLYCOLAX) 17 g packet Take 17 g by mouth daily as needed for mild constipation or moderate constipation.   sitaGLIPtin (JANUVIA) 25 MG tablet Take 1 tablet (25 mg total) by mouth daily.   sodium chloride 1 g tablet Take 1 tablet (1 g total) by mouth 3 (three) times daily with meals. DX: Hypo-osmolality and hyponatremia   vitamin B-12 (CYANOCOBALAMIN) 500 MCG tablet Take 500 mcg by mouth daily.   [DISCONTINUED] alendronate (FOSAMAX) 70 MG tablet Take 1 tablet (70 mg total) by mouth once a week. Sunday   No facility-administered encounter medications on file as of 02/05/2023.     SIGNIFICANT DIAGNOSTIC EXAMS  LABS REVIEWED: PREVIOUS   12-11-22: wbc 8.6; hgb 11.1; hct 34.1; mcv 93.9 plt 178; glucose 270; bun 14; creat 0.82; k+ 4.0; na++ 126; ca 8.7; gfr >60; hgb A1c 7.8; urine culture: e-coli 12-29-22: glucose 318; bun 19; creat 0.99; k+ 4.6; na++ 124; ca 8.4; gfr 54 01-05-23: wbc 9.0; hgb 9.4; hct 28.5; mcv 93.4 plt 244; glucose 179; bun 24; creat 0.97; k+ 4.9; na++ 123; ca 8.8; gfr 56; protein 5.4; albumin 2.8; mag 1.5; urine culture: multiple  01-06-23; tsh 2.408 vitamin B1: 80.2 01-07-23; glucose 188; bun 16; creat 0.92; k+ 3.7; na++ 130; ca 8.6 gfr 59 01-08-23: wbc 9.5; hgb 9.7; hct 29.5; mcv 93.7 plt 260; glucose 114; bun 13; creat 0.81; k+ 3.4; na++ 131; ca 8.3; gfr 60; mag 1.8 AM cortisol 13.7; chol 171; ldl 86; trig 69 hdl 71 01-10-23; wbc 7.7; hgb 9.7; hct 29.8; mcv 94.0 plt 247; glucose 145; bun 15; creat 0.78; k+ 3.6; na++ 131; ca 8.0; gfr >60  TODAY 01-16-23: glucose 81; bun 17; creat 0.89; k+ 4.2; na++ 135; ca 9.8; gfr >60 01-24-23: wbc 14.2; hgb 10.1; hct 31.5; mcv 95.5 plt 281; glucose 198; bun 35; creat 1.38; k+ 4.6; na++ 135; ca 9.4 gfr 36;  protein 5.9; albumin 3.0 UA+  01-29-23: vitamin B12: 1174 02-03-23: urine culture: proteus mirabilis 02-04-23: wbc 12.8; hgb 10.8; hct 33.6; mcv 94.9; plt 248; glucose 193; bun 29; creat 1.12; k+ 4.0; na++ 132; ca 9.9; gfr 47    Review of Systems  Constitutional:  Negative for malaise/fatigue.  Respiratory:  Negative for cough and shortness of breath.   Cardiovascular:  Negative for chest pain, palpitations and leg swelling.  Gastrointestinal:  Negative for abdominal pain, constipation and heartburn.  Musculoskeletal:  Negative for back pain, joint pain and myalgias.  Skin: Negative.   Neurological:  Negative for dizziness.  Psychiatric/Behavioral:  The patient is not nervous/anxious.    Physical Exam Constitutional:      General: She is not in acute distress.    Appearance: She is well-developed. She is not diaphoretic.  Neck:     Thyroid: No thyromegaly.  Cardiovascular:     Rate and Rhythm: Normal rate and regular rhythm.     Pulses: Normal pulses.     Heart sounds: Normal heart sounds.  Pulmonary:     Effort: Pulmonary effort is normal. No respiratory distress.     Breath sounds: Normal breath sounds.  Abdominal:     General: Bowel sounds are normal. There is no distension.     Palpations: Abdomen is soft.     Tenderness: There is no abdominal tenderness.  Musculoskeletal:        General: Normal range of motion.     Cervical back: Neck supple.     Right lower leg: No edema.     Left lower leg: No edema.  Lymphadenopathy:     Cervical: No cervical adenopathy.  Skin:    General: Skin is warm and dry.     Comments: Steri strips above right eye   Neurological:     Mental Status: She is alert. Mental status is at baseline.  Psychiatric:        Mood and Affect: Mood normal.       ASSESSMENT/ PLAN:  TODAY  Major neurocognitive disorder Vascular dementia without behavioral disturbance  Will continue current medications and will continue to monitor her status     Ok Edwards NP [facility]   call [phone]

## 2023-02-06 LAB — URINE CULTURE: Culture: >=100000 — AB

## 2023-02-07 DIAGNOSIS — R1312 Dysphagia, oropharyngeal phase: Secondary | ICD-10-CM | POA: Diagnosis not present

## 2023-02-07 DIAGNOSIS — R2681 Unsteadiness on feet: Secondary | ICD-10-CM | POA: Diagnosis not present

## 2023-02-07 DIAGNOSIS — M6281 Muscle weakness (generalized): Secondary | ICD-10-CM | POA: Diagnosis not present

## 2023-02-09 DIAGNOSIS — R1312 Dysphagia, oropharyngeal phase: Secondary | ICD-10-CM | POA: Diagnosis not present

## 2023-02-09 DIAGNOSIS — R2681 Unsteadiness on feet: Secondary | ICD-10-CM | POA: Diagnosis not present

## 2023-02-09 DIAGNOSIS — M6281 Muscle weakness (generalized): Secondary | ICD-10-CM | POA: Diagnosis not present

## 2023-02-10 ENCOUNTER — Encounter: Payer: Self-pay | Admitting: Adult Health

## 2023-02-10 ENCOUNTER — Non-Acute Institutional Stay (SKILLED_NURSING_FACILITY): Payer: Medicare Other | Admitting: Adult Health

## 2023-02-10 ENCOUNTER — Other Ambulatory Visit: Payer: Self-pay | Admitting: Adult Health

## 2023-02-10 DIAGNOSIS — F0390 Unspecified dementia without behavioral disturbance: Secondary | ICD-10-CM | POA: Insufficient documentation

## 2023-02-10 DIAGNOSIS — F0150 Vascular dementia without behavioral disturbance: Secondary | ICD-10-CM | POA: Insufficient documentation

## 2023-02-10 DIAGNOSIS — G894 Chronic pain syndrome: Secondary | ICD-10-CM | POA: Diagnosis not present

## 2023-02-10 MED ORDER — CODEINE SULFATE 15 MG PO TABS: 15.0000 mg | ORAL_TABLET | Freq: Four times a day (QID) | ORAL | 0 refills | Status: DC | PRN

## 2023-02-11 ENCOUNTER — Encounter: Payer: Self-pay | Admitting: Adult Health

## 2023-02-11 ENCOUNTER — Non-Acute Institutional Stay (SKILLED_NURSING_FACILITY): Payer: Medicare Other | Admitting: Adult Health

## 2023-02-11 DIAGNOSIS — Z794 Long term (current) use of insulin: Secondary | ICD-10-CM

## 2023-02-11 DIAGNOSIS — E1159 Type 2 diabetes mellitus with other circulatory complications: Secondary | ICD-10-CM

## 2023-02-11 DIAGNOSIS — N1832 Chronic kidney disease, stage 3b: Secondary | ICD-10-CM | POA: Diagnosis not present

## 2023-02-11 DIAGNOSIS — M810 Age-related osteoporosis without current pathological fracture: Secondary | ICD-10-CM | POA: Diagnosis not present

## 2023-02-11 DIAGNOSIS — G894 Chronic pain syndrome: Secondary | ICD-10-CM | POA: Insufficient documentation

## 2023-02-11 DIAGNOSIS — I152 Hypertension secondary to endocrine disorders: Secondary | ICD-10-CM | POA: Diagnosis not present

## 2023-02-11 DIAGNOSIS — E1122 Type 2 diabetes mellitus with diabetic chronic kidney disease: Secondary | ICD-10-CM | POA: Diagnosis not present

## 2023-02-11 NOTE — Progress Notes (Signed)
Location:  [place] Room Number: 151 Place of Service:  SNF (31)   CODE STATUS: dnr   Allergies  Allergen Reactions   Atropine    Demerol [Meperidine Hcl]    Penicillins     Chief Complaint  Patient presents with   Acute Visit    Pain management     HPI:  She is having poor pain management. She is presently taking voltaren gel; celebrex; lidoderm patch. Her family states that she had been taking Relafen in the past with pain relief. This medication has a poor side effect profile. Her family is wanting her pain better managed. Her pain is generalized to include: knees; elbow; shoulders and muscle pain in her thighs.   Past Medical History:  Diagnosis Date   Arthritis    Carpal tunnel syndrome    Hyponatremia    Macular degeneration    RSV (respiratory syncytial virus infection)    Type 2 diabetes mellitus ([place])    UTI (urinary tract infection)     Past Surgical History:  Procedure Laterality Date   ABDOMINAL HYSTERECTOMY     APPENDECTOMY     BACK SURGERY     CHOLECYSTECTOMY     HIP SURGERY     TONSILLECTOMY      Social History   Socioeconomic History   Marital status: Widowed    Spouse name: Not on file   Number of children: Not on file   Years of education: Not on file   Highest education level: Not on file  Occupational History   Not on file  Tobacco Use   Smoking status: Never   Smokeless tobacco: Never  Vaping Use   Vaping Use: Never used  Substance and Sexual Activity   Alcohol use: Never   Drug use: Never   Sexual activity: Not on file  Other Topics Concern   Not on file  Social History Narrative   Not on file   Social Determinants of Health   Financial Resource Strain: Not on file  Food Insecurity: No Food Insecurity (01/06/2023)   Hunger Vital Sign    Worried About Running Out of Food in the Last Year: Never true    Ran Out of Food in the Last Year: Never true  Recent Concern: Food Insecurity - Food  Insecurity Present (12/12/2022)   Hunger Vital Sign    Worried About Running Out of Food in the Last Year: Never true    [place] in the Last Year: Sometimes true  Transportation Needs: No Transportation Needs (01/06/2023)   PRAPARE - Hydrologist (Medical): No    Lack of Transportation (Non-Medical): No  Physical Activity: Not on file  Stress: Not on file  Social Connections: Not on file  Intimate Partner Violence: Not At Risk (01/06/2023)   Humiliation, Afraid, Rape, and Kick questionnaire    Fear of Current or Ex-Partner: No    Emotionally Abused: No    Physically Abused: No    Sexually Abused: No   No family history on file.    VITAL SIGNS BP 137/89   Pulse 72   Temp 98.1 F (36.7 C)   Resp 18   Ht '5\' 6"'$  (1.676 m)   Wt 125 lb 6.4 oz (56.9 kg)   SpO2 95%   BMI 20.24 kg/m   Outpatient Encounter Medications as of 02/10/2023  Medication Sig   acetaminophen (ACETAMINOPHEN 8 HOUR) 650 MG CR tablet Take 650 mg  by mouth every 6 (six) hours. DX: Polyosteoarthritis, unspecified   Aspirin 81 MG CAPS Take 1 capsule by mouth daily.   Balsam [place]-Castor Oil (VENELEX) OINT Apply 1 Application topically as directed. Every Shift Day, Evening, Night   Calcium Carb-Cholecalciferol (CALCIUM 600+D3 PO) Take 1 tablet by mouth 2 (two) times daily.   celecoxib (CELEBREX) 200 MG capsule Take 1 capsule (200 mg total) by mouth daily.   Cholecalciferol (VITAMIN D3) 10 MCG (400 UNIT) tablet Take 400 Units by mouth daily.   codeine 15 MG tablet Take 1 tablet (15 mg total) by mouth every 6 (six) hours as needed.   diclofenac Sodium (VOLTAREN ARTHRITIS PAIN) 1 % GEL Apply 2 g topically 4 (four) times daily.   docusate sodium (COLACE) 100 MG capsule Take 100 mg by mouth every evening.   insulin glargine (LANTUS SOLOSTAR) 100 UNIT/ML Solostar Pen Inject 10 Units into the skin at bedtime.   Insulin Pen Needle (BD AUTOSHIELD DUO) 30G X 5 MM MISC 1 Needle by Does not  apply route as directed. with insulin   lidocaine 4 % Place 1 patch onto the skin in the morning and at bedtime.   metFORMIN (GLUCOPHAGE-XR) 500 MG 24 hr tablet Take 500 mg by mouth daily with breakfast.   Multiple Vitamins-Minerals (PRESERVISION AREDS PO) Take 1 tablet by mouth 2 (two) times daily.   ondansetron (ZOFRAN-ODT) 8 MG disintegrating tablet Take 8 mg by mouth 3 (three) times daily as needed for nausea or vomiting.   polyethylene glycol (MIRALAX / GLYCOLAX) 17 g packet Take 17 g by mouth daily as needed for mild constipation or moderate constipation.   sitaGLIPtin (JANUVIA) 25 MG tablet Take 1 tablet (25 mg total) by mouth daily.   sodium chloride 1 g tablet Take 1 tablet (1 g total) by mouth 3 (three) times daily with meals. DX: Hypo-osmolality and hyponatremia   vitamin B-12 (CYANOCOBALAMIN) 500 MCG tablet Take 500 mcg by mouth daily.   No facility-administered encounter medications on file as of 02/10/2023.     SIGNIFICANT DIAGNOSTIC EXAMS  12-11-22: wbc 8.6; hgb 11.1; hct 34.1; mcv 93.9 plt 178; glucose 270; bun 14; creat 0.82; k+ 4.0; na++ 126; ca 8.7; gfr >60; hgb A1c 7.8; urine culture: e-coli 12-29-22: glucose 318; bun 19; creat 0.99; k+ 4.6; na++ 124; ca 8.4; gfr 54 01-05-23: wbc 9.0; hgb 9.4; hct 28.5; mcv 93.4 plt 244; glucose 179; bun 24; creat 0.97; k+ 4.9; na++ 123; ca 8.8; gfr 56; protein 5.4; albumin 2.8; mag 1.5; urine culture: multiple  01-06-23; tsh 2.408 vitamin B1: 80.2 01-07-23; glucose 188; bun 16; creat 0.92; k+ 3.7; na++ 130; ca 8.6 gfr 59 01-08-23: wbc 9.5; hgb 9.7; hct 29.5; mcv 93.7 plt 260; glucose 114; bun 13; creat 0.81; k+ 3.4; na++ 131; ca 8.3; gfr 60; mag 1.8 AM cortisol 13.7; chol 171; ldl 86; trig 69 hdl 71 01-10-23; wbc 7.7; hgb 9.7; hct 29.8; mcv 94.0 plt 247; glucose 145; bun 15; creat 0.78; k+ 3.6; na++ 131; ca 8.0; gfr >60 01-16-23: glucose 81; bun 17; creat 0.89; k+ 4.2; na++ 135; ca 9.8; gfr >60 01-24-23: wbc 14.2; hgb 10.1; hct 31.5; mcv 95.5 plt  281; glucose 198; bun 35; creat 1.38; k+ 4.6; na++ 135; ca 9.4 gfr 36; protein 5.9; albumin 3.0 UA+  01-29-23: vitamin B12: 1174 02-03-23: urine culture: proteus mirabilis 02-04-23: wbc 12.8; hgb 10.8; hct 33.6; mcv 94.9; plt 248; glucose 193; bun 29; creat 1.12; k+ 4.0; na++ 132; ca 9.9; gfr 47  NO NEW LABS    Review of Systems  Constitutional:  Negative for malaise/fatigue.  Respiratory:  Negative for cough and shortness of breath.   Cardiovascular:  Negative for chest pain, palpitations and leg swelling.  Gastrointestinal:  Negative for abdominal pain, constipation and heartburn.  Musculoskeletal:  Positive for joint pain and myalgias. Negative for back pain.  Skin: Negative.   Neurological:  Negative for dizziness.  Psychiatric/Behavioral:  The patient is not nervous/anxious.    Physical Exam Constitutional:      General: She is not in acute distress.    Appearance: She is well-developed. She is not diaphoretic.  Neck:     Thyroid: No thyromegaly.  Cardiovascular:     Rate and Rhythm: Normal rate and regular rhythm.     Pulses: Normal pulses.     Heart sounds: Normal heart sounds.  Pulmonary:     Effort: Pulmonary effort is normal. No respiratory distress.     Breath sounds: Normal breath sounds.  Abdominal:     General: Bowel sounds are normal. There is no distension.     Palpations: Abdomen is soft.     Tenderness: There is no abdominal tenderness.  Musculoskeletal:        General: Normal range of motion.     Cervical back: Neck supple.     Right lower leg: No edema.     Left lower leg: No edema.  Lymphadenopathy:     Cervical: No cervical adenopathy.  Skin:    General: Skin is warm and dry.     Comments: Steri strips above right eye    Neurological:     Mental Status: She is alert. Mental status is at baseline.  Psychiatric:        Mood and Affect: Mood normal.       ASSESSMENT/ PLAN:  TODAY  Chronic pain syndrome: will stop voltaren gel; will begin  cymbalta 30 mg daily; and will have codeine 15 mg every 6 hours as needed for pain.    Ok Edwards NP [facility]  call [phone]

## 2023-02-11 NOTE — Progress Notes (Signed)
Location:  [facility] Room Number: 151 Place of Service:  SNF (31)   CODE STATUS: dnr   Allergies  Allergen Reactions   Atropine    Demerol [Meperidine Hcl]    Penicillins     Chief Complaint  Patient presents with   Medical Management of Chronic Issues             Type 2 diabetes mellitus with hyperglycemia without long term current use of insulin: Hypertension associated with diabetes: Post menopausal osteoporosis:     HPI:  She is a 89 year old long term resident of this facility being seen for the management of her chronic illnesses: Type 2 diabetes mellitus with hyperglycemia without long term current use of insulin: h Hypertension associated with diabetes: Post menopausal osteoporosis. She has had a recent with a laceration on her right side forehead. She has been having joint an muscular pain without adequate relief. She just had her pain medications adjusted.   Past Medical History:  Diagnosis Date   Arthritis    Carpal tunnel syndrome    Hyponatremia    Macular degeneration    RSV (respiratory syncytial virus infection)    Type 2 diabetes mellitus ([place])    UTI (urinary tract infection)     Past Surgical History:  Procedure Laterality Date   ABDOMINAL HYSTERECTOMY     APPENDECTOMY     BACK SURGERY     CHOLECYSTECTOMY     HIP SURGERY     TONSILLECTOMY      Social History   Socioeconomic History   Marital status: Widowed    Spouse name: Not on file   Number of children: Not on file   Years of education: Not on file   Highest education level: Not on file  Occupational History   Not on file  Tobacco Use   Smoking status: Never   Smokeless tobacco: Never  Vaping Use   Vaping Use: Never used  Substance and Sexual Activity   Alcohol use: Never   Drug use: Never   Sexual activity: Not on file  Other Topics Concern   Not on file  Social History Narrative   Not on file   Social Determinants of Health   Financial Resource  Strain: Not on file  Food Insecurity: No Food Insecurity (01/06/2023)   Hunger Vital Sign    Worried About Running Out of Food in the Last Year: Never true    Ran Out of Food in the Last Year: Never true  Recent Concern: Food Insecurity - Food Insecurity Present (12/12/2022)   Hunger Vital Sign    Worried About Running Out of Food in the Last Year: Never true    [place] in the Last Year: Sometimes true  Transportation Needs: No Transportation Needs (01/06/2023)   PRAPARE - Hydrologist (Medical): No    Lack of Transportation (Non-Medical): No  Physical Activity: Not on file  Stress: Not on file  Social Connections: Not on file  Intimate Partner Violence: Not At Risk (01/06/2023)   Humiliation, Afraid, Rape, and Kick questionnaire    Fear of Current or Ex-Partner: No    Emotionally Abused: No    Physically Abused: No    Sexually Abused: No   No family history on file.    VITAL SIGNS BP 122/62   Pulse 78   Temp (!) 97.3 F (36.3 C)   Resp 16   Ht '5\' 6"'$  (1.676  m)   Wt 125 lb 6.4 oz (56.9 kg)   SpO2 95%   BMI 20.24 kg/m   Outpatient Encounter Medications as of 02/11/2023  Medication Sig   DULoxetine (CYMBALTA) 30 MG capsule Take 30 mg by mouth daily.   acetaminophen (ACETAMINOPHEN 8 HOUR) 650 MG CR tablet Take 650 mg by mouth every 6 (six) hours. DX: Polyosteoarthritis, unspecified   Aspirin 81 MG CAPS Take 1 capsule by mouth daily.   Balsam [place]-Castor Oil (VENELEX) OINT Apply 1 Application topically as directed. Every Shift Day, Evening, Night   Calcium Carb-Cholecalciferol (CALCIUM 600+D3 PO) Take 1 tablet by mouth 2 (two) times daily.   celecoxib (CELEBREX) 200 MG capsule Take 1 capsule (200 mg total) by mouth daily.   Cholecalciferol (VITAMIN D3) 10 MCG (400 UNIT) tablet Take 400 Units by mouth daily.   codeine 15 MG tablet Take 1 tablet (15 mg total) by mouth every 6 (six) hours as needed.   docusate sodium (COLACE) 100 MG capsule  Take 100 mg by mouth every evening.   insulin glargine (LANTUS SOLOSTAR) 100 UNIT/ML Solostar Pen Inject 10 Units into the skin at bedtime.   Insulin Pen Needle (BD AUTOSHIELD DUO) 30G X 5 MM MISC 1 Needle by Does not apply route as directed. with insulin   lidocaine 4 % Place 1 patch onto the skin in the morning and at bedtime.   metFORMIN (GLUCOPHAGE-XR) 500 MG 24 hr tablet Take 500 mg by mouth daily with breakfast.   Multiple Vitamins-Minerals (PRESERVISION AREDS PO) Take 1 tablet by mouth 2 (two) times daily.   ondansetron (ZOFRAN-ODT) 8 MG disintegrating tablet Take 8 mg by mouth 3 (three) times daily as needed for nausea or vomiting.   polyethylene glycol (MIRALAX / GLYCOLAX) 17 g packet Take 17 g by mouth daily as needed for mild constipation or moderate constipation.   sitaGLIPtin (JANUVIA) 25 MG tablet Take 1 tablet (25 mg total) by mouth daily.   sodium chloride 1 g tablet Take 1 tablet (1 g total) by mouth 3 (three) times daily with meals. DX: Hypo-osmolality and hyponatremia   vitamin B-12 (CYANOCOBALAMIN) 500 MCG tablet Take 500 mcg by mouth daily.   [DISCONTINUED] diclofenac Sodium (VOLTAREN ARTHRITIS PAIN) 1 % GEL Apply 2 g topically 4 (four) times daily.   No facility-administered encounter medications on file as of 02/11/2023.     SIGNIFICANT DIAGNOSTIC EXAMS  LABS REVIEWED: PREVIOUS   12-11-22: wbc 8.6; hgb 11.1; hct 34.1; mcv 93.9 plt 178; glucose 270; bun 14; creat 0.82; k+ 4.0; na++ 126; ca 8.7; gfr >60; hgb A1c 7.8; urine culture: e-coli 12-29-22: glucose 318; bun 19; creat 0.99; k+ 4.6; na++ 124; ca 8.4; gfr 54 01-05-23: wbc 9.0; hgb 9.4; hct 28.5; mcv 93.4 plt 244; glucose 179; bun 24; creat 0.97; k+ 4.9; na++ 123; ca 8.8; gfr 56; protein 5.4; albumin 2.8; mag 1.5; urine culture: multiple  01-06-23; tsh 2.408 vitamin B1: 80.2 01-07-23; glucose 188; bun 16; creat 0.92; k+ 3.7; na++ 130; ca 8.6 gfr 59 01-08-23: wbc 9.5; hgb 9.7; hct 29.5; mcv 93.7 plt 260; glucose 114; bun  13; creat 0.81; k+ 3.4; na++ 131; ca 8.3; gfr 60; mag 1.8 AM cortisol 13.7; chol 171; ldl 86; trig 69 hdl 71 01-10-23; wbc 7.7; hgb 9.7; hct 29.8; mcv 94.0 plt 247; glucose 145; bun 15; creat 0.78; k+ 3.6; na++ 131; ca 8.0; gfr >60  01-29-23: vitamin B12: 1174 02-03-23: urine culture: proteus mirabilis 02-04-23: wbc 12.8; hgb 10.8; hct 33.6; mcv  94.9; plt 248; glucose 193; bun 29; creat 1.12; k+ 4.0; na++ 132; ca 9.9; gfr 47  NO NEW LABS.    Review of Systems  Constitutional:  Negative for malaise/fatigue.  Respiratory:  Negative for cough and shortness of breath.   Cardiovascular:  Negative for chest pain, palpitations and leg swelling.  Gastrointestinal:  Negative for abdominal pain, constipation and heartburn.  Musculoskeletal:  Positive for back pain and myalgias. Negative for joint pain.  Skin: Negative.   Neurological:  Negative for dizziness.  Psychiatric/Behavioral:  The patient is not nervous/anxious.      Physical Exam Constitutional:      General: She is not in acute distress.    Appearance: She is well-developed. She is not diaphoretic.  Neck:     Thyroid: No thyromegaly.  Cardiovascular:     Rate and Rhythm: Normal rate and regular rhythm.     Pulses: Normal pulses.     Heart sounds: Normal heart sounds.  Pulmonary:     Effort: Pulmonary effort is normal. No respiratory distress.     Breath sounds: Normal breath sounds.  Abdominal:     General: Bowel sounds are normal. There is no distension.     Palpations: Abdomen is soft.     Tenderness: There is no abdominal tenderness.  Musculoskeletal:        General: Normal range of motion.     Cervical back: Neck supple.     Right lower leg: No edema.     Left lower leg: No edema.  Lymphadenopathy:     Cervical: No cervical adenopathy.  Skin:    General: Skin is warm and dry.  Neurological:     Mental Status: She is alert. Mental status is at baseline.  Psychiatric:        Mood and Affect: Mood normal.        ASSESSMENT/ PLAN:  TODAY  Type 2 diabetes mellitus with hyperglycemia without long term current use of insulin: hgb A1c 7.8;  will continue lantus 10 units nightly januvia 25 mg daily   2. Hypertension associated with diabetes: b/p 122/62 will continue  asa 81 mg daily   3. Post menopausal osteoporosis: is off fosamax    PREVIOUS   4. Bilateral primary osteoarthritis of knees/chronic pain syndrome: will continue  celebrex 200 mg daily tylenol 650 mg every 6 hours cymbalta 30 mg daily and has codeine 15 mg every 6 hours as needed   5. History of cva: will continue asa 81 mg daily   6. Hyponatremia: will continue nacl 1 gm three times daily   7. Post menopausal osteoporosis: will continue fosamax 70 mg weekly   8. Generalized weakness: will continue therapy as directed to improve upon her independence with her adls.   9.  Chronic constipation: will continue colace daily and miralax daily as needed   10. Thoracic aortic atherosclerosis (10-04-21 ct)    Ok Edwards NP [facility]  call [phone]

## 2023-02-16 DIAGNOSIS — I739 Peripheral vascular disease, unspecified: Secondary | ICD-10-CM | POA: Diagnosis not present

## 2023-02-16 DIAGNOSIS — B351 Tinea unguium: Secondary | ICD-10-CM | POA: Diagnosis not present

## 2023-02-16 DIAGNOSIS — M2041 Other hammer toe(s) (acquired), right foot: Secondary | ICD-10-CM | POA: Diagnosis not present

## 2023-02-16 DIAGNOSIS — M2042 Other hammer toe(s) (acquired), left foot: Secondary | ICD-10-CM | POA: Diagnosis not present

## 2023-02-16 DIAGNOSIS — R262 Difficulty in walking, not elsewhere classified: Secondary | ICD-10-CM | POA: Diagnosis not present

## 2023-02-17 DIAGNOSIS — L89153 Pressure ulcer of sacral region, stage 3: Secondary | ICD-10-CM | POA: Diagnosis not present

## 2023-02-17 DIAGNOSIS — E1165 Type 2 diabetes mellitus with hyperglycemia: Secondary | ICD-10-CM | POA: Diagnosis not present

## 2023-02-17 DIAGNOSIS — E871 Hypo-osmolality and hyponatremia: Secondary | ICD-10-CM | POA: Diagnosis not present

## 2023-02-17 DIAGNOSIS — M810 Age-related osteoporosis without current pathological fracture: Secondary | ICD-10-CM | POA: Diagnosis not present

## 2023-02-17 DIAGNOSIS — E1122 Type 2 diabetes mellitus with diabetic chronic kidney disease: Secondary | ICD-10-CM | POA: Diagnosis not present

## 2023-02-17 DIAGNOSIS — N189 Chronic kidney disease, unspecified: Secondary | ICD-10-CM | POA: Diagnosis not present

## 2023-02-17 DIAGNOSIS — N390 Urinary tract infection, site not specified: Secondary | ICD-10-CM | POA: Diagnosis not present

## 2023-02-17 DIAGNOSIS — I10 Essential (primary) hypertension: Secondary | ICD-10-CM | POA: Diagnosis not present

## 2023-03-04 ENCOUNTER — Non-Acute Institutional Stay (SKILLED_NURSING_FACILITY): Payer: Medicare Other | Admitting: Adult Health

## 2023-03-04 ENCOUNTER — Encounter: Payer: Self-pay | Admitting: Adult Health

## 2023-03-04 DIAGNOSIS — Z66 Do not resuscitate: Secondary | ICD-10-CM

## 2023-03-04 DIAGNOSIS — E871 Hypo-osmolality and hyponatremia: Secondary | ICD-10-CM

## 2023-03-04 DIAGNOSIS — M170 Bilateral primary osteoarthritis of knee: Secondary | ICD-10-CM

## 2023-03-04 DIAGNOSIS — G894 Chronic pain syndrome: Secondary | ICD-10-CM

## 2023-03-04 DIAGNOSIS — Z8673 Personal history of transient ischemic attack (TIA), and cerebral infarction without residual deficits: Secondary | ICD-10-CM | POA: Diagnosis not present

## 2023-03-04 NOTE — Progress Notes (Signed)
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 Wanda Shields is tired today; did want to go back to bed. No reports of pain; no reports of anxiety or agitation.   Past Medical History:  Diagnosis Date   Arthritis    Carpal tunnel syndrome    Hyponatremia    Macular degeneration    RSV (respiratory syncytial virus infection)    Type 2 diabetes mellitus ([place])    UTI (urinary tract infection)     Past Surgical History:  Procedure Laterality Date   ABDOMINAL HYSTERECTOMY     APPENDECTOMY     BACK SURGERY     CHOLECYSTECTOMY     HIP SURGERY     TONSILLECTOMY      Social History   Socioeconomic History   Marital status: Widowed    Spouse name: Not on file   Number of children: Not on file   Years of education: Not on file   Highest education level: Not on file  Occupational History   Not on file  Tobacco Use   Smoking status: Never   Smokeless tobacco: Never  Vaping Use   Vaping Use: Never used  Substance and Sexual Activity   Alcohol use: Never   Drug use: Never   Sexual activity: Not on file  Other Topics Concern   Not on file  Social History Narrative   Not on file   Social Determinants of Health   Financial Resource Strain: Not on file  Food Insecurity: No Food Insecurity (01/06/2023)   Hunger Vital Sign    Worried About Running Out of Food in the Last Year: Never true    Ran Out of Food in the  Last Year: Never true  Recent Concern: Food Insecurity - Food Insecurity Present (12/12/2022)   Hunger Vital Sign    Worried About Running Out of Food in the Last Year: Never true    [place] in the Last Year: Sometimes true  Transportation Needs: No Transportation Needs (01/06/2023)   PRAPARE - Hydrologist (Medical): No    Lack of Transportation (Non-Medical): No  Physical Activity: Not on file  Stress: Not on file  Social Connections: Not on file  Intimate Partner Violence: Not At Risk (01/06/2023)   Humiliation, Afraid, Rape, and Kick questionnaire    Fear of Current or Ex-Partner: No    Emotionally Abused: No    Physically Abused: No    Sexually Abused: No   History reviewed. No pertinent family history.    VITAL SIGNS BP 132/80   Pulse 93   Ht 5\' 6"  (1.676 m)   Wt 125 lb (56.7 kg)   BMI 20.18 kg/m   Outpatient Encounter Medications as of 03/04/2023  Medication Sig   acetaminophen (ACETAMINOPHEN 8 HOUR) 650 MG  CR tablet Take 650 mg by mouth every 6 (six) hours. DX: Polyosteoarthritis, unspecified   Aspirin 81 MG CAPS Take 1 capsule by mouth daily.   Balsam [place]-Castor Oil (VENELEX) OINT Apply 1 Application topically as directed. Every Shift Day, Evening, Night   Calcium Carb-Cholecalciferol (CALCIUM 600+D3 PO) Take 1 tablet by mouth 2 (two) times daily.   celecoxib (CELEBREX) 200 MG capsule Take 1 capsule (200 mg total) by mouth daily.   Cholecalciferol (VITAMIN D3) 10 MCG (400 UNIT) tablet Take 400 Units by mouth daily.   codeine 15 MG tablet Take 1 tablet (15 mg total) by mouth every 6 (six) hours as needed.   docusate sodium (COLACE) 100 MG capsule Take 100 mg by mouth every evening.   DULoxetine (CYMBALTA) 30 MG capsule Take 30 mg by mouth daily.   insulin glargine (LANTUS SOLOSTAR) 100 UNIT/ML Solostar Pen Inject 10 Units into the skin at bedtime.   Insulin Pen Needle (BD AUTOSHIELD DUO) 30G X 5 MM MISC 1 Needle by Does not apply  route as directed. with insulin   lidocaine 4 % Place 1 patch onto the skin in the morning and at bedtime.   metFORMIN (GLUCOPHAGE-XR) 500 MG 24 hr tablet Take 500 mg by mouth daily with breakfast.   Multiple Vitamins-Minerals (PRESERVISION AREDS PO) Take 1 tablet by mouth 2 (two) times daily.   ondansetron (ZOFRAN-ODT) 8 MG disintegrating tablet Take 8 mg by mouth 3 (three) times daily as needed for nausea or vomiting.   polyethylene glycol (MIRALAX / GLYCOLAX) 17 g packet Take 17 g by mouth daily as needed for mild constipation or moderate constipation.   sitaGLIPtin (JANUVIA) 25 MG tablet Take 1 tablet (25 mg total) by mouth daily.   sodium chloride 1 g tablet Take 1 tablet (1 g total) by mouth 3 (three) times daily with meals. DX: Hypo-osmolality and hyponatremia   vitamin B-12 (CYANOCOBALAMIN) 500 MCG tablet Take 500 mcg by mouth daily.   No facility-administered encounter medications on file as of 03/04/2023.     SIGNIFICANT DIAGNOSTIC EXAMS  LABS REVIEWED: PREVIOUS   12-11-22: wbc 8.6; hgb 11.1; hct 34.1; mcv 93.9 plt 178; glucose 270; bun 14; creat 0.82; k+ 4.0; na++ 126; ca 8.7; gfr >60; hgb A1c 7.8; urine culture: e-coli 12-29-22: glucose 318; bun 19; creat 0.99; k+ 4.6; na++ 124; ca 8.4; gfr 54 01-05-23: wbc 9.0; hgb 9.4; hct 28.5; mcv 93.4 plt 244; glucose 179; bun 24; creat 0.97; k+ 4.9; na++ 123; ca 8.8; gfr 56; protein 5.4; albumin 2.8; mag 1.5; urine culture: multiple  01-06-23; tsh 2.408 vitamin B1: 80.2 01-07-23; glucose 188; bun 16; creat 0.92; k+ 3.7; na++ 130; ca 8.6 gfr 59 01-08-23: wbc 9.5; hgb 9.7; hct 29.5; mcv 93.7 plt 260; glucose 114; bun 13; creat 0.81; k+ 3.4; na++ 131; ca 8.3; gfr 60; mag 1.8 AM cortisol 13.7; chol 171; ldl 86; trig 69 hdl 71 01-10-23; wbc 7.7; hgb 9.7; hct 29.8; mcv 94.0 plt 247; glucose 145; bun 15; creat 0.78; k+ 3.6; na++ 131; ca 8.0; gfr >60  01-29-23: vitamin B12: 1174 02-03-23: urine culture: proteus mirabilis 02-04-23: wbc 12.8; hgb 10.8; hct  33.6; mcv 94.9; plt 248; glucose 193; bun 29; creat 1.12; k+ 4.0; na++ 132; ca 9.9; gfr 47  NO NEW LABS.    Review of Systems  Constitutional:  Positive for malaise/fatigue.  Respiratory:  Negative for cough and shortness of breath.   Cardiovascular:  Negative for chest pain, palpitations and leg swelling.  Gastrointestinal:  Negative for abdominal pain, constipation and heartburn.  Musculoskeletal:  Negative for back pain, joint pain and myalgias.  Skin: Negative.   Neurological:  Negative for dizziness.  Psychiatric/Behavioral:  The patient is not nervous/anxious.    Physical Exam Constitutional:      General: Wanda Shields is not in acute distress.    Appearance: Wanda Shields is well-developed. Wanda Shields is not diaphoretic.  Neck:     Thyroid: No thyromegaly.  Cardiovascular:     Rate and Rhythm: Normal rate and regular rhythm.     Pulses: Normal pulses.     Heart sounds: Normal heart sounds.  Pulmonary:     Effort: Pulmonary effort is normal. No respiratory distress.     Breath sounds: Normal breath sounds.  Abdominal:     General: Bowel sounds are normal. There is no distension.     Palpations: Abdomen is soft.     Tenderness: There is no abdominal tenderness.  Musculoskeletal:        General: Normal range of motion.     Cervical back: Neck supple.     Right lower leg: No edema.     Left lower leg: No edema.  Lymphadenopathy:     Cervical: No cervical adenopathy.  Skin:    General: Skin is warm and dry.  Neurological:     Mental Status: Wanda Shields is alert. Mental status is at baseline.  Psychiatric:        Mood and Affect: Mood normal.         ASSESSMENT/ PLAN:  TODAY  Bilateral primary osteoarthritis of knees/chronic pain syndrome: will continue celebrex 200 mg daily tylenol 650 mg every 6 hours; cymbalta 30 mg daily and has codeine 15 mg every 6 hours as needed  2. History of CVA: will continue asa 81 mg daily   3. Hyponatremia: will continue nacl 1 gm three times daily      PREVIOUS   7. Post menopausal osteoporosis: will monitor   8. Generalized weakness: is without change  9.  Chronic constipation: will continue colace daily and miralax daily as needed   10. Thoracic aortic atherosclerosis (10-04-21 ct)   11. Type 2 diabetes mellitus with hyperglycemia without long term current use of insulin: hgb A1c 7.8;  will continue lantus 10 units nightly januvia 25 mg daily   12. Hypertension associated with diabetes: b/p 132/80 will continue  asa 81 mg daily   13. Post menopausal osteoporosis: is off fosamax   Ok Edwards NP [facility]   call [phone]

## 2023-03-05 ENCOUNTER — Non-Acute Institutional Stay (SKILLED_NURSING_FACILITY): Payer: Medicare Other | Admitting: Adult Health

## 2023-03-05 ENCOUNTER — Encounter: Payer: Self-pay | Admitting: Adult Health

## 2023-03-05 ENCOUNTER — Other Ambulatory Visit (HOSPITAL_COMMUNITY)
Admission: RE | Admit: 2023-03-05 | Discharge: 2023-03-05 | Disposition: A | Discharge status: Home / Self Care | Payer: Medicare Other | Source: Skilled Nursing Facility | Attending: Adult Health | Admitting: Adult Health

## 2023-03-05 DIAGNOSIS — R918 Other nonspecific abnormal finding of lung field: Secondary | ICD-10-CM | POA: Diagnosis not present

## 2023-03-05 DIAGNOSIS — N179 Acute kidney failure, unspecified: Secondary | ICD-10-CM | POA: Diagnosis not present

## 2023-03-05 DIAGNOSIS — R531 Weakness: Secondary | ICD-10-CM | POA: Insufficient documentation

## 2023-03-05 DIAGNOSIS — D72829 Elevated white blood cell count, unspecified: Secondary | ICD-10-CM | POA: Diagnosis not present

## 2023-03-05 DIAGNOSIS — S2232XD Fracture of one rib, left side, subsequent encounter for fracture with routine healing: Secondary | ICD-10-CM | POA: Diagnosis not present

## 2023-03-05 DIAGNOSIS — B370 Candidal stomatitis: Secondary | ICD-10-CM | POA: Diagnosis not present

## 2023-03-05 DIAGNOSIS — N1832 Chronic kidney disease, stage 3b: Secondary | ICD-10-CM

## 2023-03-05 DIAGNOSIS — M6281 Muscle weakness (generalized): Secondary | ICD-10-CM | POA: Diagnosis present

## 2023-03-05 LAB — BASIC METABOLIC PANEL
Anion gap: 14 (ref 5–15)
BUN: 33 mg/dL — ABNORMAL HIGH (ref 8–23)
CO2: 22 mmol/L (ref 22–32)
Calcium: 10.6 mg/dL — ABNORMAL HIGH (ref 8.9–10.3)
Chloride: 97 mmol/L — ABNORMAL LOW (ref 98–111)
Creatinine, Ser: 1.47 mg/dL — ABNORMAL HIGH (ref 0.44–1.00)
GFR, Estimated: 33 mL/min — ABNORMAL LOW (ref >60)
Glucose, Bld: 354 mg/dL — ABNORMAL HIGH (ref 70–99)
Potassium: 4.5 mmol/L (ref 3.5–5.1)
Sodium: 133 mmol/L — ABNORMAL LOW (ref 135–145)

## 2023-03-05 LAB — CBC
HCT: 38.4 % (ref 36.0–46.0)
Hemoglobin: 12.5 g/dL (ref 12.0–15.0)
MCH: 30 pg (ref 26.0–34.0)
MCHC: 32.6 g/dL (ref 30.0–36.0)
MCV: 92.3 fL (ref 80.0–100.0)
Platelets: 454 10*3/uL — ABNORMAL HIGH (ref 150–400)
RBC: 4.16 MIL/uL (ref 3.87–5.11)
RDW: 14 % (ref 11.5–15.5)
WBC: 16 10*3/uL — ABNORMAL HIGH (ref 4.0–10.5)
nRBC: 0 % (ref 0.0–0.2)

## 2023-03-05 NOTE — Progress Notes (Signed)
Location:  [place] Room Number: 151 Place of Service:  SNF (31)   CODE STATUS: dnr   Allergies  Allergen Reactions   Atropine    Demerol [Meperidine Hcl]    Penicillins     Chief Complaint  Patient presents with   Acute Visit    Increased confusion     HPI:  Her family is concerned about her being more confused and more lethargic. She does engage with conversation but is not making any sense. Today she looks dry with poor skin turgor. She has yeast on her tongue. There are no reports of fevers present. There are no reports of cough present.   Past Medical History:  Diagnosis Date   Arthritis    Carpal tunnel syndrome    Hyponatremia    Macular degeneration    RSV (respiratory syncytial virus infection)    Type 2 diabetes mellitus ([place])    UTI (urinary tract infection)     Past Surgical History:  Procedure Laterality Date   ABDOMINAL HYSTERECTOMY     APPENDECTOMY     BACK SURGERY     CHOLECYSTECTOMY     HIP SURGERY     TONSILLECTOMY      Social History   Socioeconomic History   Marital status: Widowed    Spouse name: Not on file   Number of children: Not on file   Years of education: Not on file   Highest education level: Not on file  Occupational History   Not on file  Tobacco Use   Smoking status: Never   Smokeless tobacco: Never  Vaping Use   Vaping Use: Never used  Substance and Sexual Activity   Alcohol use: Never   Drug use: Never   Sexual activity: Not on file  Other Topics Concern   Not on file  Social History Narrative   Not on file   Social Determinants of Health   Financial Resource Strain: Not on file  Food Insecurity: No Food Insecurity (01/06/2023)   Hunger Vital Sign    Worried About Running Out of Food in the Last Year: Never true    Ran Out of Food in the Last Year: Never true  Recent Concern: Food Insecurity - Food Insecurity Present (12/12/2022)   Hunger Vital Sign    Worried About Running Out  of Food in the Last Year: Never true    [place] in the Last Year: Sometimes true  Transportation Needs: No Transportation Needs (01/06/2023)   PRAPARE - Hydrologist (Medical): No    Lack of Transportation (Non-Medical): No  Physical Activity: Not on file  Stress: Not on file  Social Connections: Not on file  Intimate Partner Violence: Not At Risk (01/06/2023)   Humiliation, Afraid, Rape, and Kick questionnaire    Fear of Current or Ex-Partner: No    Emotionally Abused: No    Physically Abused: No    Sexually Abused: No   No family history on file.    VITAL SIGNS BP 132/88   Pulse 93   Temp (!) 97.1 F (36.2 C)   Resp 18   Ht 5\' 6"  (1.676 m)   Wt 125 lb 9.6 oz (57 kg)   SpO2 96%   BMI 20.27 kg/m   Outpatient Encounter Medications as of 03/05/2023  Medication Sig   acetaminophen (ACETAMINOPHEN 8 HOUR) 650 MG CR tablet Take 650 mg by mouth every 6 (six) hours. DX: Polyosteoarthritis, unspecified  Aspirin 81 MG CAPS Take 1 capsule by mouth daily.   Balsam [place]-Castor Oil (VENELEX) OINT Apply 1 Application topically as directed. Every Shift Day, Evening, Night   Calcium Carb-Cholecalciferol (CALCIUM 600+D3 PO) Take 1 tablet by mouth 2 (two) times daily.   celecoxib (CELEBREX) 200 MG capsule Take 1 capsule (200 mg total) by mouth daily.   Cholecalciferol (VITAMIN D3) 10 MCG (400 UNIT) tablet Take 400 Units by mouth daily.   codeine 15 MG tablet Take 1 tablet (15 mg total) by mouth every 6 (six) hours as needed.   docusate sodium (COLACE) 100 MG capsule Take 100 mg by mouth every evening.   DULoxetine (CYMBALTA) 30 MG capsule Take 30 mg by mouth daily.   insulin glargine (LANTUS SOLOSTAR) 100 UNIT/ML Solostar Pen Inject 10 Units into the skin at bedtime.   Insulin Pen Needle (BD AUTOSHIELD DUO) 30G X 5 MM MISC 1 Needle by Does not apply route as directed. with insulin   lidocaine 4 % Place 1 patch onto the skin in the morning and at bedtime.    metFORMIN (GLUCOPHAGE-XR) 500 MG 24 hr tablet Take 500 mg by mouth daily with breakfast.   Multiple Vitamins-Minerals (PRESERVISION AREDS PO) Take 1 tablet by mouth 2 (two) times daily.   ondansetron (ZOFRAN-ODT) 8 MG disintegrating tablet Take 8 mg by mouth 3 (three) times daily as needed for nausea or vomiting.   polyethylene glycol (MIRALAX / GLYCOLAX) 17 g packet Take 17 g by mouth daily as needed for mild constipation or moderate constipation.   sitaGLIPtin (JANUVIA) 25 MG tablet Take 1 tablet (25 mg total) by mouth daily.   sodium chloride 1 g tablet Take 1 tablet (1 g total) by mouth 3 (three) times daily with meals. DX: Hypo-osmolality and hyponatremia   vitamin B-12 (CYANOCOBALAMIN) 500 MCG tablet Take 500 mcg by mouth daily.   No facility-administered encounter medications on file as of 03/05/2023.     SIGNIFICANT DIAGNOSTIC EXAMS   LABS REVIEWED: PREVIOUS   12-11-22: wbc 8.6; hgb 11.1; hct 34.1; mcv 93.9 plt 178; glucose 270; bun 14; creat 0.82; k+ 4.0; na++ 126; ca 8.7; gfr >60; hgb A1c 7.8; urine culture: e-coli 12-29-22: glucose 318; bun 19; creat 0.99; k+ 4.6; na++ 124; ca 8.4; gfr 54 01-05-23: wbc 9.0; hgb 9.4; hct 28.5; mcv 93.4 plt 244; glucose 179; bun 24; creat 0.97; k+ 4.9; na++ 123; ca 8.8; gfr 56; protein 5.4; albumin 2.8; mag 1.5; urine culture: multiple  01-06-23; tsh 2.408 vitamin B1: 80.2 01-07-23; glucose 188; bun 16; creat 0.92; k+ 3.7; na++ 130; ca 8.6 gfr 59 01-08-23: wbc 9.5; hgb 9.7; hct 29.5; mcv 93.7 plt 260; glucose 114; bun 13; creat 0.81; k+ 3.4; na++ 131; ca 8.3; gfr 60; mag 1.8 AM cortisol 13.7; chol 171; ldl 86; trig 69 hdl 71 01-10-23; wbc 7.7; hgb 9.7; hct 29.8; mcv 94.0 plt 247; glucose 145; bun 15; creat 0.78; k+ 3.6; na++ 131; ca 8.0; gfr >60  01-29-23: vitamin B12: 1174 02-03-23: urine culture: proteus mirabilis 02-04-23: wbc 12.8; hgb 10.8; hct 33.6; mcv 94.9; plt 248; glucose 193; bun 29; creat 1.12; k+ 4.0; na++ 132; ca 9.9; gfr  47  TODAY  03-04-23: wbc 16.0; hgb 12.5; hct 38.4; mcv 92.3 plt 454; glucose 354; bun 33; creat 1.47; k+ 4.5; na++ 133; ca 10.6 gfr 33   Review of Systems  Constitutional:  Positive for malaise/fatigue.  Respiratory:  Negative for cough and shortness of breath.   Cardiovascular:  Negative for  chest pain, palpitations and leg swelling.  Gastrointestinal:  Negative for abdominal pain, constipation and heartburn.  Musculoskeletal:  Negative for back pain, joint pain and myalgias.  Skin: Negative.   Neurological:  Negative for dizziness.  Psychiatric/Behavioral:  The patient is not nervous/anxious.    Physical Exam Constitutional:      General: She is not in acute distress.    Appearance: She is well-developed. She is not diaphoretic.  HENT:     Head:     Comments: Tongue with yeast present  Neck:     Thyroid: No thyromegaly.  Cardiovascular:     Rate and Rhythm: Normal rate and regular rhythm.     Pulses: Normal pulses.     Heart sounds: Normal heart sounds.  Pulmonary:     Effort: Pulmonary effort is normal. No respiratory distress.     Breath sounds: Normal breath sounds.  Abdominal:     General: Bowel sounds are normal. There is no distension.     Palpations: Abdomen is soft.     Tenderness: There is no abdominal tenderness.  Musculoskeletal:        General: Normal range of motion.     Cervical back: Neck supple.     Right lower leg: No edema.     Left lower leg: No edema.  Lymphadenopathy:     Cervical: No cervical adenopathy.  Skin:    General: Skin is warm and dry.     Comments: Poor turgor   Neurological:     Mental Status: She is alert. Mental status is at baseline. She is disoriented.  Psychiatric:        Mood and Affect: Mood normal.       ASSESSMENT/ PLAN:  TODAY  Acute renal failure superimposed on stage 3b chronic kidney disease unspecified aucte renal failure type Oral thrush  Will begin NS at 75 cc per hour for 2 liters Will begin nystatin  swish and spit for one week.    Ok Edwards NP [facility]  call [phone]

## 2023-03-06 ENCOUNTER — Other Ambulatory Visit (HOSPITAL_COMMUNITY)
Admission: RE | Admit: 2023-03-06 | Discharge: 2023-03-06 | Disposition: A | Discharge status: Home / Self Care | Payer: Medicare Other | Source: Skilled Nursing Facility | Attending: Adult Health | Admitting: Adult Health

## 2023-03-06 ENCOUNTER — Non-Acute Institutional Stay (SKILLED_NURSING_FACILITY): Payer: Medicare Other | Admitting: Adult Health

## 2023-03-06 ENCOUNTER — Encounter: Payer: Self-pay | Admitting: Adult Health

## 2023-03-06 DIAGNOSIS — N390 Urinary tract infection, site not specified: Secondary | ICD-10-CM | POA: Diagnosis not present

## 2023-03-06 DIAGNOSIS — N183 Chronic kidney disease, stage 3 unspecified: Secondary | ICD-10-CM | POA: Insufficient documentation

## 2023-03-06 DIAGNOSIS — N1832 Chronic kidney disease, stage 3b: Secondary | ICD-10-CM | POA: Insufficient documentation

## 2023-03-06 DIAGNOSIS — D72820 Lymphocytosis (symptomatic): Secondary | ICD-10-CM | POA: Diagnosis not present

## 2023-03-06 DIAGNOSIS — J189 Pneumonia, unspecified organism: Secondary | ICD-10-CM

## 2023-03-06 DIAGNOSIS — B9689 Other specified bacterial agents as the cause of diseases classified elsewhere: Secondary | ICD-10-CM | POA: Insufficient documentation

## 2023-03-06 DIAGNOSIS — B370 Candidal stomatitis: Secondary | ICD-10-CM | POA: Insufficient documentation

## 2023-03-06 NOTE — Progress Notes (Unsigned)
Location:  [place] Room Number: 151 Place of Service:  SNF (31)   CODE STATUS: DNR  Allergies  Allergen Reactions   Atropine    Demerol [Meperidine Hcl]    Penicillins     Chief Complaint  Patient presents with   Acute Visit    Pneumonia     HPI:  She has had a decline in her status. She has had a chest x-ray and urine culture. Her chest x-ray demonstrates a left base infiltrate. She is not coughing. There are no reports of fevers present. Her renal function is worse.   Past Medical History:  Diagnosis Date   Arthritis    Carpal tunnel syndrome    Hyponatremia    Macular degeneration    RSV (respiratory syncytial virus infection)    Type 2 diabetes mellitus ([place])    UTI (urinary tract infection)     Past Surgical History:  Procedure Laterality Date   ABDOMINAL HYSTERECTOMY     APPENDECTOMY     BACK SURGERY     CHOLECYSTECTOMY     HIP SURGERY     TONSILLECTOMY      Social History   Socioeconomic History   Marital status: Widowed    Spouse name: Not on file   Number of children: Not on file   Years of education: Not on file   Highest education level: Not on file  Occupational History   Not on file  Tobacco Use   Smoking status: Never   Smokeless tobacco: Never  Vaping Use   Vaping Use: Never used  Substance and Sexual Activity   Alcohol use: Never   Drug use: Never   Sexual activity: Not on file  Other Topics Concern   Not on file  Social History Narrative   Not on file   Social Determinants of Health   Financial Resource Strain: Not on file  Food Insecurity: No Food Insecurity (01/06/2023)   Hunger Vital Sign    Worried About Running Out of Food in the Last Year: Never true    Ran Out of Food in the Last Year: Never true  Recent Concern: Food Insecurity - Food Insecurity Present (12/12/2022)   Hunger Vital Sign    Worried About Running Out of Food in the Last Year: Never true    [place] in the Last Year:  Sometimes true  Transportation Needs: No Transportation Needs (01/06/2023)   PRAPARE - Hydrologist (Medical): No    Lack of Transportation (Non-Medical): No  Physical Activity: Not on file  Stress: Not on file  Social Connections: Not on file  Intimate Partner Violence: Not At Risk (01/06/2023)   Humiliation, Afraid, Rape, and Kick questionnaire    Fear of Current or Ex-Partner: No    Emotionally Abused: No    Physically Abused: No    Sexually Abused: No   History reviewed. No pertinent family history.    VITAL SIGNS BP 130/70   Pulse 88   Temp 97.6 F (36.4 C)   Resp 18   Ht 5\' 6"  (1.676 m)   Wt 125 lb 9.6 oz (57 kg)   SpO2 99%   BMI 20.27 kg/m   Outpatient Encounter Medications as of 03/06/2023  Medication Sig   acetaminophen (ACETAMINOPHEN 8 HOUR) 650 MG CR tablet Take 650 mg by mouth every 6 (six) hours. DX: Polyosteoarthritis, unspecified   Aspirin 81 MG CAPS Take 1 capsule by mouth daily.  Balsam [place]-Castor Oil (VENELEX) OINT Apply 1 Application topically as directed. Every Shift Day, Evening, Night   Calcium Carb-Cholecalciferol (CALCIUM 600+D3 PO) Take 1 tablet by mouth 2 (two) times daily.   celecoxib (CELEBREX) 200 MG capsule Take 1 capsule (200 mg total) by mouth daily.   Cholecalciferol (VITAMIN D3) 10 MCG (400 UNIT) tablet Take 400 Units by mouth daily.   codeine 15 MG tablet Take 1 tablet (15 mg total) by mouth every 6 (six) hours as needed.   docusate sodium (COLACE) 100 MG capsule Take 100 mg by mouth every evening.   DULoxetine (CYMBALTA) 30 MG capsule Take 30 mg by mouth daily.   insulin glargine (LANTUS SOLOSTAR) 100 UNIT/ML Solostar Pen Inject 10 Units into the skin at bedtime.   Insulin Pen Needle (BD AUTOSHIELD DUO) 30G X 5 MM MISC 1 Needle by Does not apply route as directed. with insulin   lidocaine 4 % Place 1 patch onto the skin in the morning and at bedtime.   metFORMIN (GLUCOPHAGE-XR) 500 MG 24 hr tablet Take 500 mg  by mouth daily with breakfast.   Multiple Vitamins-Minerals (PRESERVISION AREDS PO) Take 1 tablet by mouth 2 (two) times daily.   ondansetron (ZOFRAN-ODT) 8 MG disintegrating tablet Take 8 mg by mouth 3 (three) times daily as needed for nausea or vomiting.   polyethylene glycol (MIRALAX / GLYCOLAX) 17 g packet Take 17 g by mouth daily as needed for mild constipation or moderate constipation.   sitaGLIPtin (JANUVIA) 25 MG tablet Take 1 tablet (25 mg total) by mouth daily.   sodium chloride 1 g tablet Take 1 tablet (1 g total) by mouth 3 (three) times daily with meals. DX: Hypo-osmolality and hyponatremia   vitamin B-12 (CYANOCOBALAMIN) 500 MCG tablet Take 500 mcg by mouth daily.   No facility-administered encounter medications on file as of 03/06/2023.     SIGNIFICANT DIAGNOSTIC EXAMS  TODAY  03-05-23: chest x-ray: left base infiltrate   LABS REVIEWED: PREVIOUS   12-11-22: wbc 8.6; hgb 11.1; hct 34.1; mcv 93.9 plt 178; glucose 270; bun 14; creat 0.82; k+ 4.0; na++ 126; ca 8.7; gfr >60; hgb A1c 7.8; urine culture: e-coli 12-29-22: glucose 318; bun 19; creat 0.99; k+ 4.6; na++ 124; ca 8.4; gfr 54 01-05-23: wbc 9.0; hgb 9.4; hct 28.5; mcv 93.4 plt 244; glucose 179; bun 24; creat 0.97; k+ 4.9; na++ 123; ca 8.8; gfr 56; protein 5.4; albumin 2.8; mag 1.5; urine culture: multiple  01-06-23; tsh 2.408 vitamin B1: 80.2 01-07-23; glucose 188; bun 16; creat 0.92; k+ 3.7; na++ 130; ca 8.6 gfr 59 01-08-23: wbc 9.5; hgb 9.7; hct 29.5; mcv 93.7 plt 260; glucose 114; bun 13; creat 0.81; k+ 3.4; na++ 131; ca 8.3; gfr 60; mag 1.8 AM cortisol 13.7; chol 171; ldl 86; trig 69 hdl 71 01-10-23; wbc 7.7; hgb 9.7; hct 29.8; mcv 94.0 plt 247; glucose 145; bun 15; creat 0.78; k+ 3.6; na++ 131; ca 8.0; gfr >60  01-29-23: vitamin B12: 1174 02-03-23: urine culture: proteus mirabilis 02-04-23: wbc 12.8; hgb 10.8; hct 33.6; mcv 94.9; plt 248; glucose 193; bun 29; creat 1.12; k+ 4.0; na++ 132; ca 9.9; gfr 47  TODAY  03-04-23:  wbc 16.0; hgb 12.5; hct 38.4; mcv 92.3 plt 454; glucose 354; bun 33; creat 1.47; k+ 4.5; na++ 133; ca 10.6 gfr 33   Review of Systems  Constitutional:  Positive for malaise/fatigue.  Respiratory:  Negative for cough and shortness of breath.   Cardiovascular:  Negative for chest pain, palpitations  and leg swelling.  Gastrointestinal:  Negative for abdominal pain, constipation and heartburn.  Musculoskeletal:  Negative for back pain, joint pain and myalgias.  Skin: Negative.   Neurological:  Negative for dizziness.  Psychiatric/Behavioral:  The patient is not nervous/anxious.     Physical Exam Constitutional:      General: She is not in acute distress.    Appearance: She is well-developed. She is not diaphoretic.  Neck:     Thyroid: No thyromegaly.  Cardiovascular:     Rate and Rhythm: Normal rate and regular rhythm.     Pulses: Normal pulses.     Heart sounds: Normal heart sounds.  Pulmonary:     Effort: Pulmonary effort is normal. No respiratory distress.     Breath sounds: Normal breath sounds.  Abdominal:     General: Bowel sounds are normal. There is no distension.     Palpations: Abdomen is soft.     Tenderness: There is no abdominal tenderness.  Musculoskeletal:        General: Normal range of motion.     Cervical back: Neck supple.     Right lower leg: No edema.     Left lower leg: No edema.  Lymphadenopathy:     Cervical: No cervical adenopathy.  Skin:    General: Skin is warm and dry.  Neurological:     Mental Status: She is alert. She is disoriented.  Psychiatric:        Mood and Affect: Mood normal.       ASSESSMENT/ PLAN:  TODAY  Pneumonia of left lower lobe unspecified organism: will begin doxycycline 100 mg twice daily and will await urine culture.    Ok Edwards NP [facility]  call [phone]

## 2023-03-08 ENCOUNTER — Telehealth: Payer: Self-pay | Admitting: Orthopedic Surgery

## 2023-03-08 DIAGNOSIS — N3000 Acute cystitis without hematuria: Secondary | ICD-10-CM

## 2023-03-08 MED ORDER — SULFAMETHOXAZOLE-TRIMETHOPRIM 800-160 MG PO TABS: 1.0000 | ORAL_TABLET | Freq: Two times a day (BID) | ORAL | 0 refills | Status: AC

## 2023-03-08 NOTE — Telephone Encounter (Signed)
[facility] nursing reports urine culture results> 100,000 proteus mirabilis and 30,000 E.coli. Last UTI was positive for same organism and treated with Septra 800/160 mg BID x 5 days. She was also recently treated with doxycycline for pneumonia. Plan to restart Septra as before. Encourage hydration with water. Advised nurse to notify NP tomorrow morning of results as well.

## 2023-03-09 ENCOUNTER — Non-Acute Institutional Stay (SKILLED_NURSING_FACILITY): Payer: Medicare Other | Admitting: Adult Health

## 2023-03-09 ENCOUNTER — Encounter: Payer: Self-pay | Admitting: Adult Health

## 2023-03-09 ENCOUNTER — Other Ambulatory Visit (HOSPITAL_COMMUNITY)
Admission: RE | Admit: 2023-03-09 | Discharge: 2023-03-09 | Disposition: A | Discharge status: Home / Self Care | Payer: Medicare Other | Source: Skilled Nursing Facility | Attending: Adult Health | Admitting: Adult Health

## 2023-03-09 DIAGNOSIS — N1832 Chronic kidney disease, stage 3b: Secondary | ICD-10-CM | POA: Diagnosis not present

## 2023-03-09 DIAGNOSIS — N179 Acute kidney failure, unspecified: Secondary | ICD-10-CM | POA: Insufficient documentation

## 2023-03-09 DIAGNOSIS — J189 Pneumonia, unspecified organism: Secondary | ICD-10-CM

## 2023-03-09 LAB — BASIC METABOLIC PANEL
Anion gap: 9 (ref 5–15)
BUN: 24 mg/dL — ABNORMAL HIGH (ref 8–23)
CO2: 23 mmol/L (ref 22–32)
Calcium: 9.5 mg/dL (ref 8.9–10.3)
Chloride: 104 mmol/L (ref 98–111)
Creatinine, Ser: 1.08 mg/dL — ABNORMAL HIGH (ref 0.44–1.00)
GFR, Estimated: 48 mL/min — ABNORMAL LOW (ref >60)
Glucose, Bld: 149 mg/dL — ABNORMAL HIGH (ref 70–99)
Potassium: 4.3 mmol/L (ref 3.5–5.1)
Sodium: 136 mmol/L (ref 135–145)

## 2023-03-09 LAB — URINE CULTURE: Culture: >=100000 — AB

## 2023-03-09 NOTE — Progress Notes (Unsigned)
Location:  [place] Room Number: 151 P Place of Service:  SNF (31)   CODE STATUS: DNR  Allergies  Allergen Reactions   Atropine    Demerol [Meperidine Hcl]    Penicillins     Chief Complaint  Patient presents with   Acute Visit    Lab follow up     HPI:  She has received 2 liters of NS due to her acute renal failure. She has been placed on doxycycline for her pneumonia. Her po intake remains poor. There are no reports of fevers present.   Past Medical History:  Diagnosis Date   Arthritis    Carpal tunnel syndrome    Hyponatremia    Macular degeneration    RSV (respiratory syncytial virus infection)    Type 2 diabetes mellitus ([place])    UTI (urinary tract infection)     Past Surgical History:  Procedure Laterality Date   ABDOMINAL HYSTERECTOMY     APPENDECTOMY     BACK SURGERY     CHOLECYSTECTOMY     HIP SURGERY     TONSILLECTOMY      Social History   Socioeconomic History   Marital status: Widowed    Spouse name: Not on file   Number of children: Not on file   Years of education: Not on file   Highest education level: Not on file  Occupational History   Not on file  Tobacco Use   Smoking status: Never   Smokeless tobacco: Never  Vaping Use   Vaping Use: Never used  Substance and Sexual Activity   Alcohol use: Never   Drug use: Never   Sexual activity: Not on file  Other Topics Concern   Not on file  Social History Narrative   Not on file   Social Determinants of Health   Financial Resource Strain: Not on file  Food Insecurity: No Food Insecurity (01/06/2023)   Hunger Vital Sign    Worried About Running Out of Food in the Last Year: Never true    Ran Out of Food in the Last Year: Never true  Recent Concern: Food Insecurity - Food Insecurity Present (12/12/2022)   Hunger Vital Sign    Worried About Running Out of Food in the Last Year: Never true    [place] in the Last Year: Sometimes true  Transportation  Needs: No Transportation Needs (01/06/2023)   PRAPARE - Hydrologist (Medical): No    Lack of Transportation (Non-Medical): No  Physical Activity: Not on file  Stress: Not on file  Social Connections: Not on file  Intimate Partner Violence: Not At Risk (01/06/2023)   Humiliation, Afraid, Rape, and Kick questionnaire    Fear of Current or Ex-Partner: No    Emotionally Abused: No    Physically Abused: No    Sexually Abused: No   History reviewed. No pertinent family history.    VITAL SIGNS BP 130/70   Pulse 88   Temp (!) 97 F (36.1 C)   Resp 18   Ht 5\' 6"  (1.676 m)   Wt 125 lb 9.6 oz (57 kg)   SpO2 99%   BMI 20.27 kg/m   Outpatient Encounter Medications as of 03/09/2023  Medication Sig   acetaminophen (ACETAMINOPHEN 8 HOUR) 650 MG CR tablet Take 650 mg by mouth every 6 (six) hours. DX: Polyosteoarthritis, unspecified   acidophilus (RISAQUAD) CAPS capsule Take 1 capsule by mouth daily. 9 am  Aspirin 81 MG CAPS Take 1 capsule by mouth daily.   Balsam [place]-Castor Oil (VENELEX) OINT Apply 1 Application topically as directed. Every Shift Day, Evening, Night   Calcium Carb-Cholecalciferol (CALCIUM 600+D3 PO) Take 1 tablet by mouth 2 (two) times daily.   celecoxib (CELEBREX) 200 MG capsule Take 1 capsule (200 mg total) by mouth daily.   Cholecalciferol (VITAMIN D3) 10 MCG (400 UNIT) tablet Take 400 Units by mouth daily.   codeine 15 MG tablet Take 1 tablet (15 mg total) by mouth every 6 (six) hours as needed.   docusate sodium (COLACE) 100 MG capsule Take 100 mg by mouth every evening.   DULoxetine (CYMBALTA) 30 MG capsule Take 30 mg by mouth daily.   insulin glargine (LANTUS SOLOSTAR) 100 UNIT/ML Solostar Pen Inject 10 Units into the skin at bedtime.   Insulin Pen Needle (BD AUTOSHIELD DUO) 30G X 5 MM MISC 1 Needle by Does not apply route as directed. with insulin   lidocaine 4 % Place 1 patch onto the skin in the morning and at bedtime.   metFORMIN  (GLUCOPHAGE-XR) 500 MG 24 hr tablet Take 500 mg by mouth daily with breakfast.   Multiple Vitamins-Minerals (PRESERVISION AREDS PO) Take 1 tablet by mouth 2 (two) times daily.   ondansetron (ZOFRAN-ODT) 8 MG disintegrating tablet Take 8 mg by mouth 3 (three) times daily as needed for nausea or vomiting.   polyethylene glycol (MIRALAX / GLYCOLAX) 17 g packet Take 17 g by mouth daily as needed for mild constipation or moderate constipation.   sitaGLIPtin (JANUVIA) 25 MG tablet Take 1 tablet (25 mg total) by mouth daily.   sodium chloride 0.9 % SOLN Parenteral solution; - ; amt: 75 cc per hour; intravenous Special Instructions: for acute renal failure: GIVE 2 LITERS ONLY Every Shift; Day, Evening, Night   sodium chloride 1 g tablet Take 1 tablet (1 g total) by mouth 3 (three) times daily with meals. DX: Hypo-osmolality and hyponatremia   sulfamethoxazole-trimethoprim (BACTRIM DS) 800-160 MG tablet Take 1 tablet by mouth 2 (two) times daily for 5 days.   UNABLE TO FIND DIET: DYS 3 / Thin liquids. Consistent Carbohydrate [DX: Respiratory syncytial virus pneumonia]   vitamin B-12 (CYANOCOBALAMIN) 500 MCG tablet Take 500 mcg by mouth daily.   No facility-administered encounter medications on file as of 03/09/2023.     SIGNIFICANT DIAGNOSTIC EXAMS  TODAY  03-05-23: chest x-ray: left base infiltrate   LABS REVIEWED: PREVIOUS   12-11-22: wbc 8.6; hgb 11.1; hct 34.1; mcv 93.9 plt 178; glucose 270; bun 14; creat 0.82; k+ 4.0; na++ 126; ca 8.7; gfr >60; hgb A1c 7.8; urine culture: e-coli 12-29-22: glucose 318; bun 19; creat 0.99; k+ 4.6; na++ 124; ca 8.4; gfr 54 01-05-23: wbc 9.0; hgb 9.4; hct 28.5; mcv 93.4 plt 244; glucose 179; bun 24; creat 0.97; k+ 4.9; na++ 123; ca 8.8; gfr 56; protein 5.4; albumin 2.8; mag 1.5; urine culture: multiple  01-06-23; tsh 2.408 vitamin B1: 80.2 01-07-23; glucose 188; bun 16; creat 0.92; k+ 3.7; na++ 130; ca 8.6 gfr 59 01-08-23: wbc 9.5; hgb 9.7; hct 29.5; mcv 93.7 plt  260; glucose 114; bun 13; creat 0.81; k+ 3.4; na++ 131; ca 8.3; gfr 60; mag 1.8 AM cortisol 13.7; chol 171; ldl 86; trig 69 hdl 71 01-10-23; wbc 7.7; hgb 9.7; hct 29.8; mcv 94.0 plt 247; glucose 145; bun 15; creat 0.78; k+ 3.6; na++ 131; ca 8.0; gfr >60  01-29-23: vitamin B12: 1174 02-03-23: urine culture: proteus mirabilis 02-04-23:  wbc 12.8; hgb 10.8; hct 33.6; mcv 94.9; plt 248; glucose 193; bun 29; creat 1.12; k+ 4.0; na++ 132; ca 9.9; gfr 47  TODAY  03-05-23: wbc 16.0; hgb 12.5; hct 38.4; mcv 92.3 plt 454; glucose 354; bun 33; creat 1.47; k+ 4.5; na++ 133; ca 10.6 gfr 33  03-09-23: glucose 149; bun 24; creat 1.08; k+ 4.3; na++ 136; ca 9.5; gfr 48   Review of Systems  Constitutional:  Positive for malaise/fatigue.  Respiratory:  Negative for cough and shortness of breath.   Cardiovascular:  Negative for chest pain, palpitations and leg swelling.  Gastrointestinal:  Negative for abdominal pain, constipation and heartburn.  Musculoskeletal:  Negative for back pain, joint pain and myalgias.  Skin: Negative.   Neurological:  Negative for dizziness.  Psychiatric/Behavioral:  The patient is not nervous/anxious.     Physical Exam Constitutional:      General: She is not in acute distress.    Appearance: She is well-developed. She is not diaphoretic.  Neck:     Thyroid: No thyromegaly.  Cardiovascular:     Rate and Rhythm: Normal rate and regular rhythm.     Pulses: Normal pulses.     Heart sounds: Normal heart sounds.  Pulmonary:     Effort: Pulmonary effort is normal. No respiratory distress.     Breath sounds: Normal breath sounds.  Abdominal:     General: Bowel sounds are normal. There is no distension.     Palpations: Abdomen is soft.     Tenderness: There is no abdominal tenderness.  Musculoskeletal:        General: Normal range of motion.     Cervical back: Neck supple.     Right lower leg: No edema.     Left lower leg: No edema.  Lymphadenopathy:     Cervical: No cervical  adenopathy.  Skin:    General: Skin is warm and dry.  Neurological:     Mental Status: She is alert. Mental status is at baseline.  Psychiatric:        Mood and Affect: Mood normal.       ASSESSMENT/ PLAN:  TODAY  Pneumonia of left lower lobe unspecified organism Acute renal failure superimposed on stage 3b chronic kidney disease  Will give NS at 75 cc per hour for 2 liters will then repeat BMP    Ok Edwards NP [facility]   call [phone]

## 2023-03-10 DIAGNOSIS — R1312 Dysphagia, oropharyngeal phase: Secondary | ICD-10-CM | POA: Diagnosis not present

## 2023-03-10 DIAGNOSIS — R498 Other voice and resonance disorders: Secondary | ICD-10-CM | POA: Diagnosis not present

## 2023-03-10 DIAGNOSIS — Z8673 Personal history of transient ischemic attack (TIA), and cerebral infarction without residual deficits: Secondary | ICD-10-CM | POA: Diagnosis not present

## 2023-03-11 ENCOUNTER — Encounter: Payer: Self-pay | Admitting: Internal Medicine

## 2023-03-11 ENCOUNTER — Other Ambulatory Visit (HOSPITAL_COMMUNITY)
Admission: RE | Admit: 2023-03-11 | Discharge: 2023-03-11 | Disposition: A | Discharge status: Home / Self Care | Payer: Medicare Other | Source: Skilled Nursing Facility | Attending: Adult Health | Admitting: Adult Health

## 2023-03-11 DIAGNOSIS — R1312 Dysphagia, oropharyngeal phase: Secondary | ICD-10-CM | POA: Diagnosis not present

## 2023-03-11 DIAGNOSIS — R498 Other voice and resonance disorders: Secondary | ICD-10-CM | POA: Diagnosis not present

## 2023-03-11 DIAGNOSIS — N1831 Chronic kidney disease, stage 3a: Secondary | ICD-10-CM | POA: Diagnosis not present

## 2023-03-11 DIAGNOSIS — J189 Pneumonia, unspecified organism: Secondary | ICD-10-CM | POA: Insufficient documentation

## 2023-03-11 DIAGNOSIS — Z8673 Personal history of transient ischemic attack (TIA), and cerebral infarction without residual deficits: Secondary | ICD-10-CM | POA: Diagnosis not present

## 2023-03-11 LAB — BASIC METABOLIC PANEL
Anion gap: 7 (ref 5–15)
BUN: 23 mg/dL (ref 8–23)
CO2: 20 mmol/L — ABNORMAL LOW (ref 22–32)
Calcium: 9.2 mg/dL (ref 8.9–10.3)
Chloride: 107 mmol/L (ref 98–111)
Creatinine, Ser: 1.29 mg/dL — ABNORMAL HIGH (ref 0.44–1.00)
GFR, Estimated: 39 mL/min — ABNORMAL LOW (ref >60)
Glucose, Bld: 144 mg/dL — ABNORMAL HIGH (ref 70–99)
Potassium: 4.6 mmol/L (ref 3.5–5.1)
Sodium: 134 mmol/L — ABNORMAL LOW (ref 135–145)

## 2023-03-12 DIAGNOSIS — R1312 Dysphagia, oropharyngeal phase: Secondary | ICD-10-CM | POA: Diagnosis not present

## 2023-03-12 DIAGNOSIS — R498 Other voice and resonance disorders: Secondary | ICD-10-CM | POA: Diagnosis not present

## 2023-03-12 DIAGNOSIS — Z8673 Personal history of transient ischemic attack (TIA), and cerebral infarction without residual deficits: Secondary | ICD-10-CM | POA: Diagnosis not present

## 2023-03-13 ENCOUNTER — Encounter: Payer: Self-pay | Admitting: Adult Health

## 2023-03-13 DIAGNOSIS — Z8673 Personal history of transient ischemic attack (TIA), and cerebral infarction without residual deficits: Secondary | ICD-10-CM | POA: Diagnosis not present

## 2023-03-13 DIAGNOSIS — R498 Other voice and resonance disorders: Secondary | ICD-10-CM | POA: Diagnosis not present

## 2023-03-13 DIAGNOSIS — R1312 Dysphagia, oropharyngeal phase: Secondary | ICD-10-CM | POA: Diagnosis not present

## 2023-03-16 DIAGNOSIS — R498 Other voice and resonance disorders: Secondary | ICD-10-CM | POA: Diagnosis not present

## 2023-03-16 DIAGNOSIS — Z8673 Personal history of transient ischemic attack (TIA), and cerebral infarction without residual deficits: Secondary | ICD-10-CM | POA: Diagnosis not present

## 2023-03-16 DIAGNOSIS — R1312 Dysphagia, oropharyngeal phase: Secondary | ICD-10-CM | POA: Diagnosis not present

## 2023-03-16 DEATH — deceased

## 2023-03-17 DIAGNOSIS — Z8673 Personal history of transient ischemic attack (TIA), and cerebral infarction without residual deficits: Secondary | ICD-10-CM | POA: Diagnosis not present

## 2023-03-17 DIAGNOSIS — R498 Other voice and resonance disorders: Secondary | ICD-10-CM | POA: Diagnosis not present

## 2023-03-17 DIAGNOSIS — R1312 Dysphagia, oropharyngeal phase: Secondary | ICD-10-CM | POA: Diagnosis not present

## 2023-03-18 ENCOUNTER — Encounter: Payer: Self-pay | Admitting: Adult Health

## 2023-03-18 ENCOUNTER — Non-Acute Institutional Stay (SKILLED_NURSING_FACILITY): Payer: Medicare Other | Admitting: Adult Health

## 2023-03-18 DIAGNOSIS — R634 Abnormal weight loss: Secondary | ICD-10-CM | POA: Diagnosis not present

## 2023-03-18 DIAGNOSIS — R498 Other voice and resonance disorders: Secondary | ICD-10-CM | POA: Diagnosis not present

## 2023-03-18 DIAGNOSIS — R1312 Dysphagia, oropharyngeal phase: Secondary | ICD-10-CM | POA: Diagnosis not present

## 2023-03-18 DIAGNOSIS — R627 Adult failure to thrive: Secondary | ICD-10-CM | POA: Diagnosis not present

## 2023-03-18 DIAGNOSIS — F0150 Vascular dementia without behavioral disturbance: Secondary | ICD-10-CM

## 2023-03-18 DIAGNOSIS — Z8673 Personal history of transient ischemic attack (TIA), and cerebral infarction without residual deficits: Secondary | ICD-10-CM | POA: Diagnosis not present

## 2023-03-18 NOTE — Progress Notes (Signed)
Location:  [facility] Room Number: 151-P Place of Service:  SNF (31)   CODE STATUS: DNR  Allergies  Allergen Reactions   Atropine    Demerol [Meperidine Hcl]    Penicillins     Chief Complaint  Patient presents with   Acute Visit    Weight loss.    HPI:  She is losing weight. She was 130 pounds 90 days ago with her current weight of 118.8 pounds. She is being weighed 2 times weekly. She has received 4 liters of IVF without improvement in her po intake. She does have poor vision and poor appetite. She is on a D2 diet is being seen by ST. She does require assist for meals. There are no indications of distress or pain present.   Past Medical History:  Diagnosis Date   Arthritis    Carpal tunnel syndrome    Hyponatremia    Macular degeneration    RSV (respiratory syncytial virus infection)    Type 2 diabetes mellitus    UTI (urinary tract infection)     Past Surgical History:  Procedure Laterality Date   ABDOMINAL HYSTERECTOMY     APPENDECTOMY     BACK SURGERY     CHOLECYSTECTOMY     HIP SURGERY     TONSILLECTOMY      Social History   Socioeconomic History   Marital status: Widowed    Spouse name: Not on file   Number of children: Not on file   Years of education: Not on file   Highest education level: Not on file  Occupational History   Not on file  Tobacco Use   Smoking status: Never   Smokeless tobacco: Never  Vaping Use   Vaping Use: Never used  Substance and Sexual Activity   Alcohol use: Never   Drug use: Never   Sexual activity: Not on file  Other Topics Concern   Not on file  Social History Narrative   Not on file   Social Determinants of Health   Financial Resource Strain: Not on file  Food Insecurity: No Food Insecurity (01/06/2023)   Hunger Vital Sign    Worried About Running Out of Food in the Last Year: Never true    Ran Out of Food in the Last Year: Never true  Recent Concern: Food Insecurity - Food  Insecurity Present (12/12/2022)   Hunger Vital Sign    Worried About Running Out of Food in the Last Year: Never true    Ran Out of Food in the Last Year: Sometimes true  Transportation Needs: No Transportation Needs (01/06/2023)   PRAPARE - Administrator, Civil Service (Medical): No    Lack of Transportation (Non-Medical): No  Physical Activity: Not on file  Stress: Not on file  Social Connections: Not on file  Intimate Partner Violence: Not At Risk (01/06/2023)   Humiliation, Afraid, Rape, and Kick questionnaire    Fear of Current or Ex-Partner: No    Emotionally Abused: No    Physically Abused: No    Sexually Abused: No   History reviewed. No pertinent family history.    VITAL SIGNS BP 130/78   Pulse 94   Temp (!) 97 F (36.1 C)   Resp (!) 22   Ht 5\' 6"  (1.676 m)   Wt 118 lb 12.8 oz (53.9 kg)   SpO2 100%   BMI 19.17 kg/m   Outpatient Encounter Medications as of 03/18/2023  Medication Sig   acetaminophen (  ACETAMINOPHEN 8 HOUR) 650 MG CR tablet Take 650 mg by mouth every 6 (six) hours. DX: Polyosteoarthritis, unspecified   Aspirin 81 MG CAPS Take 1 capsule by mouth daily.   Balsam [place]-Castor Oil (VENELEX) OINT Apply 1 Application topically as directed. Every Shift Day, Evening, Night   Calcium Carb-Cholecalciferol (CALCIUM 600+D3 PO) Take 1 tablet by mouth 2 (two) times daily.   celecoxib (CELEBREX) 200 MG capsule Take 1 capsule (200 mg total) by mouth daily.   codeine 15 MG tablet Take 1 tablet (15 mg total) by mouth every 6 (six) hours as needed.   docusate sodium (COLACE) 100 MG capsule Take 100 mg by mouth every evening.   DULoxetine (CYMBALTA) 30 MG capsule Take 30 mg by mouth daily.   insulin glargine (LANTUS SOLOSTAR) 100 UNIT/ML Solostar Pen Inject 10 Units into the skin at bedtime.   Insulin Pen Needle (BD AUTOSHIELD DUO) 30G X 5 MM MISC 1 Needle by Does not apply route as directed. with insulin   lidocaine 4 % Place 1 patch onto the skin in the  morning and at bedtime.   metFORMIN (GLUCOPHAGE-XR) 500 MG 24 hr tablet Take 500 mg by mouth daily with breakfast.   Multiple Vitamins-Minerals (PRESERVISION AREDS PO) Take 1 tablet by mouth 2 (two) times daily.   ondansetron (ZOFRAN-ODT) 8 MG disintegrating tablet Take 8 mg by mouth 3 (three) times daily as needed for nausea or vomiting.   polyethylene glycol (MIRALAX / GLYCOLAX) 17 g packet Take 17 g by mouth daily as needed for mild constipation or moderate constipation.   sitaGLIPtin (JANUVIA) 25 MG tablet Take 1 tablet (25 mg total) by mouth daily.   sodium chloride 1 g tablet Take 1 tablet (1 g total) by mouth 3 (three) times daily with meals. DX: Hypo-osmolality and hyponatremia   UNABLE TO FIND DIET: DYS 3 / Thin liquids. Consistent Carbohydrate [DX: Respiratory syncytial virus pneumonia]   [DISCONTINUED] acidophilus (RISAQUAD) CAPS capsule Take 1 capsule by mouth daily. 9 am   [DISCONTINUED] Cholecalciferol (VITAMIN D3) 10 MCG (400 UNIT) tablet Take 400 Units by mouth daily.   [DISCONTINUED] sodium chloride 0.9 % SOLN Parenteral solution; - ; amt: 75 cc per hour; intravenous Special Instructions: for acute renal failure: GIVE 2 LITERS ONLY Every Shift; Day, Evening, Night   [DISCONTINUED] vitamin B-12 (CYANOCOBALAMIN) 500 MCG tablet Take 500 mcg by mouth daily.   No facility-administered encounter medications on file as of 03/18/2023.     SIGNIFICANT DIAGNOSTIC EXAMS  TODAY  03-05-23: chest x-ray: left base infiltrate   LABS REVIEWED: PREVIOUS   12-11-22: wbc 8.6; hgb 11.1; hct 34.1; mcv 93.9 plt 178; glucose 270; bun 14; creat 0.82; k+ 4.0; na++ 126; ca 8.7; gfr >60; hgb A1c 7.8; urine culture: e-coli 12-29-22: glucose 318; bun 19; creat 0.99; k+ 4.6; na++ 124; ca 8.4; gfr 54 01-05-23: wbc 9.0; hgb 9.4; hct 28.5; mcv 93.4 plt 244; glucose 179; bun 24; creat 0.97; k+ 4.9; na++ 123; ca 8.8; gfr 56; protein 5.4; albumin 2.8; mag 1.5; urine culture: multiple  01-06-23; tsh 2.408  vitamin B1: 80.2 01-07-23; glucose 188; bun 16; creat 0.92; k+ 3.7; na++ 130; ca 8.6 gfr 59 01-08-23: wbc 9.5; hgb 9.7; hct 29.5; mcv 93.7 plt 260; glucose 114; bun 13; creat 0.81; k+ 3.4; na++ 131; ca 8.3; gfr 60; mag 1.8 AM cortisol 13.7; chol 171; ldl 86; trig 69 hdl 71 01-10-23; wbc 7.7; hgb 9.7; hct 29.8; mcv 94.0 plt 247; glucose 145; bun 15; creat 0.78;  k+ 3.6; na++ 131; ca 8.0; gfr >60  01-29-23: vitamin B12: 1174 02-03-23: urine culture: proteus mirabilis 02-04-23: wbc 12.8; hgb 10.8; hct 33.6; mcv 94.9; plt 248; glucose 193; bun 29; creat 1.12; k+ 4.0; na++ 132; ca 9.9; gfr 47 03-05-23: wbc 16.0; hgb 12.5; hct 38.4; mcv 92.3 plt 454; glucose 354; bun 33; creat 1.47; k+ 4.5; na++ 133; ca 10.6 gfr 33  03-09-23: glucose 149; bun 24; creat 1.08; k+ 4.3; na++ 136; ca 9.5; gfr 48  NO NEW LABS.    Review of Systems  Constitutional:  Positive for malaise/fatigue.  Respiratory:  Negative for cough and shortness of breath.   Cardiovascular:  Negative for chest pain, palpitations and leg swelling.  Gastrointestinal:  Negative for abdominal pain, constipation and heartburn.  Musculoskeletal:  Negative for back pain, joint pain and myalgias.  Skin: Negative.   Neurological:  Negative for dizziness.  Psychiatric/Behavioral:  The patient is not nervous/anxious.     Physical Exam Constitutional:      General: She is not in acute distress.    Appearance: She is underweight. She is not diaphoretic.  Neck:     Thyroid: No thyromegaly.  Cardiovascular:     Rate and Rhythm: Normal rate and regular rhythm.     Pulses: Normal pulses.     Heart sounds: Normal heart sounds.  Pulmonary:     Effort: Pulmonary effort is normal. No respiratory distress.     Breath sounds: Normal breath sounds.  Abdominal:     General: Bowel sounds are normal. There is no distension.     Palpations: Abdomen is soft.     Tenderness: There is no abdominal tenderness.  Musculoskeletal:        General: Normal range of  motion.     Cervical back: Neck supple.     Right lower leg: No edema.     Left lower leg: No edema.  Lymphadenopathy:     Cervical: No cervical adenopathy.  Skin:    General: Skin is warm and dry.  Neurological:     Mental Status: She is alert. Mental status is at baseline.  Psychiatric:        Mood and Affect: Mood normal.       ASSESSMENT/ PLAN:  TODAY  Failure to thrive in adult Unintentional weight loss Vascular dementia without behavioral disturbance  Will stop: calcium; vitamin D; vitamin B12 Will check CMP Will begin remeron 7.5 mg nightly for 30 days  Will monitor her status.    Synthia Innocent NP [facility]  call [phone]

## 2023-03-19 ENCOUNTER — Other Ambulatory Visit (HOSPITAL_COMMUNITY)
Admission: RE | Admit: 2023-03-19 | Discharge: 2023-03-19 | Disposition: A | Discharge status: Home / Self Care | Payer: Medicare Other | Source: Skilled Nursing Facility | Attending: Adult Health | Admitting: Adult Health

## 2023-03-19 DIAGNOSIS — J121 Respiratory syncytial virus pneumonia: Secondary | ICD-10-CM | POA: Diagnosis present

## 2023-03-19 DIAGNOSIS — Z8673 Personal history of transient ischemic attack (TIA), and cerebral infarction without residual deficits: Secondary | ICD-10-CM | POA: Diagnosis not present

## 2023-03-19 DIAGNOSIS — R498 Other voice and resonance disorders: Secondary | ICD-10-CM | POA: Diagnosis not present

## 2023-03-19 DIAGNOSIS — R1312 Dysphagia, oropharyngeal phase: Secondary | ICD-10-CM | POA: Diagnosis not present

## 2023-03-19 LAB — COMPREHENSIVE METABOLIC PANEL
ALT: 14 U/L (ref 0–44)
AST: 15 U/L (ref 15–41)
Albumin: 2.8 g/dL — ABNORMAL LOW (ref 3.5–5.0)
Alkaline Phosphatase: 51 U/L (ref 38–126)
Anion gap: 7 (ref 5–15)
BUN: 27 mg/dL — ABNORMAL HIGH (ref 8–23)
CO2: 24 mmol/L (ref 22–32)
Calcium: 9.7 mg/dL (ref 8.9–10.3)
Chloride: 99 mmol/L (ref 98–111)
Creatinine, Ser: 1.01 mg/dL — ABNORMAL HIGH (ref 0.44–1.00)
GFR, Estimated: 53 mL/min — ABNORMAL LOW (ref >60)
Glucose, Bld: 209 mg/dL — ABNORMAL HIGH (ref 70–99)
Potassium: 4.2 mmol/L (ref 3.5–5.1)
Sodium: 130 mmol/L — ABNORMAL LOW (ref 135–145)
Total Bilirubin: 0.8 mg/dL (ref 0.3–1.2)
Total Protein: 6.1 g/dL — ABNORMAL LOW (ref 6.5–8.1)

## 2023-03-20 DIAGNOSIS — Z8673 Personal history of transient ischemic attack (TIA), and cerebral infarction without residual deficits: Secondary | ICD-10-CM | POA: Diagnosis not present

## 2023-03-20 DIAGNOSIS — R1312 Dysphagia, oropharyngeal phase: Secondary | ICD-10-CM | POA: Diagnosis not present

## 2023-03-20 DIAGNOSIS — R498 Other voice and resonance disorders: Secondary | ICD-10-CM | POA: Diagnosis not present

## 2023-03-23 ENCOUNTER — Other Ambulatory Visit: Payer: Self-pay | Admitting: Adult Health

## 2023-03-23 ENCOUNTER — Encounter: Payer: Self-pay | Admitting: Adult Health

## 2023-03-23 ENCOUNTER — Non-Acute Institutional Stay (SKILLED_NURSING_FACILITY): Payer: Medicare Other | Admitting: Adult Health

## 2023-03-23 DIAGNOSIS — F0150 Vascular dementia without behavioral disturbance: Secondary | ICD-10-CM | POA: Diagnosis not present

## 2023-03-23 DIAGNOSIS — R634 Abnormal weight loss: Secondary | ICD-10-CM | POA: Insufficient documentation

## 2023-03-23 DIAGNOSIS — F0390 Unspecified dementia without behavioral disturbance: Secondary | ICD-10-CM | POA: Diagnosis not present

## 2023-03-23 DIAGNOSIS — R627 Adult failure to thrive: Secondary | ICD-10-CM

## 2023-03-23 MED ORDER — MORPHINE SULFATE (CONCENTRATE) 20 MG/ML PO SOLN: 5.0000 mg | ORAL | 0 refills | Status: AC

## 2023-03-23 NOTE — Progress Notes (Unsigned)
Location:  [facility] Room Number: 151 P Place of Service:  SNF (31)   CODE STATUS: DNR  Allergies  Allergen Reactions   Atropine    Demerol [Meperidine Hcl]    Penicillins     Chief Complaint  Patient presents with   Acute Visit    Status change     HPI:  She continues to decline in her overall status. Her intake remains poor; she is having increased difficulty with taking her medications. She is less verbal. Her family would like to focus her care on comfort. We have discussed what this will mean to her. We will not check any more labs; and will stop all medications except those that provide direct comfort. Her family is in agreement with this plan.   Past Medical History:  Diagnosis Date   Arthritis    Carpal tunnel syndrome    Hyponatremia    Macular degeneration    RSV (respiratory syncytial virus infection)    Type 2 diabetes mellitus    UTI (urinary tract infection)     Past Surgical History:  Procedure Laterality Date   ABDOMINAL HYSTERECTOMY     APPENDECTOMY     BACK SURGERY     CHOLECYSTECTOMY     HIP SURGERY     TONSILLECTOMY      Social History   Socioeconomic History   Marital status: Widowed    Spouse name: Not on file   Number of children: Not on file   Years of education: Not on file   Highest education level: Not on file  Occupational History   Not on file  Tobacco Use   Smoking status: Never   Smokeless tobacco: Never  Vaping Use   Vaping Use: Never used  Substance and Sexual Activity   Alcohol use: Never   Drug use: Never   Sexual activity: Not on file  Other Topics Concern   Not on file  Social History Narrative   Not on file   Social Determinants of Health   Financial Resource Strain: Not on file  Food Insecurity: No Food Insecurity (01/06/2023)   Hunger Vital Sign    Worried About Running Out of Food in the Last Year: Never true    Ran Out of Food in the Last Year: Never true  Recent Concern: Food  Insecurity - Food Insecurity Present (12/12/2022)   Hunger Vital Sign    Worried About Running Out of Food in the Last Year: Never true    Ran Out of Food in the Last Year: Sometimes true  Transportation Needs: No Transportation Needs (01/06/2023)   PRAPARE - Administrator, Civil Service (Medical): No    Lack of Transportation (Non-Medical): No  Physical Activity: Not on file  Stress: Not on file  Social Connections: Not on file  Intimate Partner Violence: Not At Risk (01/06/2023)   Humiliation, Afraid, Rape, and Kick questionnaire    Fear of Current or Ex-Partner: No    Emotionally Abused: No    Physically Abused: No    Sexually Abused: No   History reviewed. No pertinent family history.    VITAL SIGNS BP 115/73   Pulse 98   Temp 97.8 F (36.6 C)   Resp 16   Ht 5\' 6"  (1.676 m)   Wt 118 lb (53.5 kg)   SpO2 96%   BMI 19.05 kg/m   Outpatient Encounter Medications as of 03/23/2023  Medication Sig   Balsam [place]-Castor Oil (VENELEX) OINT  Apply 1 Application topically as directed. Every Shift Day, Evening, Night   celecoxib (CELEBREX) 200 MG capsule Take 1 capsule (200 mg total) by mouth daily.   docusate sodium (COLACE) 100 MG capsule Take 100 mg by mouth every evening.   DULoxetine (CYMBALTA) 30 MG capsule Take 30 mg by mouth daily.   guaiFENesin-dextromethorphan (ROBITUSSIN DM) 100-10 MG/5ML syrup Take 15 mLs by mouth 2 (two) times daily.   morphine (ROXANOL) 20 MG/ML concentrated solution Take 0.25 mLs (5 mg total) by mouth every 4 (four) hours.   Nutritional Supplements (ENSURE ENLIVE PO) Take 1 Bottle by mouth 2 (two) times daily between meals.   ondansetron (ZOFRAN-ODT) 8 MG disintegrating tablet Take 8 mg by mouth 3 (three) times daily as needed for nausea or vomiting.   polyethylene glycol (MIRALAX / GLYCOLAX) 17 g packet Take 17 g by mouth daily as needed for mild constipation or moderate constipation.   UNABLE TO FIND DIET: DYS 3 / Thin liquids. Consistent  Carbohydrate [DX: Respiratory syncytial virus pneumonia]   [DISCONTINUED] acetaminophen (ACETAMINOPHEN 8 HOUR) 650 MG CR tablet Take 650 mg by mouth every 6 (six) hours. DX: Polyosteoarthritis, unspecified   [DISCONTINUED] Aspirin 81 MG CAPS Take 1 capsule by mouth daily.   [DISCONTINUED] Calcium Carb-Cholecalciferol (CALCIUM 600+D3 PO) Take 1 tablet by mouth 2 (two) times daily.   [DISCONTINUED] codeine 15 MG tablet Take 1 tablet (15 mg total) by mouth every 6 (six) hours as needed.   [DISCONTINUED] insulin glargine (LANTUS SOLOSTAR) 100 UNIT/ML Solostar Pen Inject 10 Units into the skin at bedtime.   [DISCONTINUED] Insulin Pen Needle (BD AUTOSHIELD DUO) 30G X 5 MM MISC 1 Needle by Does not apply route as directed. with insulin   [DISCONTINUED] lidocaine 4 % Place 1 patch onto the skin in the morning and at bedtime.   [DISCONTINUED] metFORMIN (GLUCOPHAGE-XR) 500 MG 24 hr tablet Take 500 mg by mouth daily with breakfast.   [DISCONTINUED] Multiple Vitamins-Minerals (PRESERVISION AREDS PO) Take 1 tablet by mouth 2 (two) times daily.   [DISCONTINUED] sitaGLIPtin (JANUVIA) 25 MG tablet Take 1 tablet (25 mg total) by mouth daily.   [DISCONTINUED] sodium chloride 1 g tablet Take 1 tablet (1 g total) by mouth 3 (three) times daily with meals. DX: Hypo-osmolality and hyponatremia   No facility-administered encounter medications on file as of 03/23/2023.     SIGNIFICANT DIAGNOSTIC EXAMS   TODAY  03-05-23: chest x-ray: left base infiltrate   LABS REVIEWED: PREVIOUS   12-11-22: wbc 8.6; hgb 11.1; hct 34.1; mcv 93.9 plt 178; glucose 270; bun 14; creat 0.82; k+ 4.0; na++ 126; ca 8.7; gfr >60; hgb A1c 7.8; urine culture: e-coli 12-29-22: glucose 318; bun 19; creat 0.99; k+ 4.6; na++ 124; ca 8.4; gfr 54 01-05-23: wbc 9.0; hgb 9.4; hct 28.5; mcv 93.4 plt 244; glucose 179; bun 24; creat 0.97; k+ 4.9; na++ 123; ca 8.8; gfr 56; protein 5.4; albumin 2.8; mag 1.5; urine culture: multiple  01-06-23; tsh 2.408  vitamin B1: 80.2 01-07-23; glucose 188; bun 16; creat 0.92; k+ 3.7; na++ 130; ca 8.6 gfr 59 01-08-23: wbc 9.5; hgb 9.7; hct 29.5; mcv 93.7 plt 260; glucose 114; bun 13; creat 0.81; k+ 3.4; na++ 131; ca 8.3; gfr 60; mag 1.8 AM cortisol 13.7; chol 171; ldl 86; trig 69 hdl 71 01-10-23; wbc 7.7; hgb 9.7; hct 29.8; mcv 94.0 plt 247; glucose 145; bun 15; creat 0.78; k+ 3.6; na++ 131; ca 8.0; gfr >60  01-29-23: vitamin B12: 1174 02-03-23: urine culture: proteus  mirabilis 02-04-23: wbc 12.8; hgb 10.8; hct 33.6; mcv 94.9; plt 248; glucose 193; bun 29; creat 1.12; k+ 4.0; na++ 132; ca 9.9; gfr 47 03-05-23: wbc 16.0; hgb 12.5; hct 38.4; mcv 92.3 plt 454; glucose 354; bun 33; creat 1.47; k+ 4.5; na++ 133; ca 10.6 gfr 33  03-09-23: glucose 149; bun 24; creat 1.08; k+ 4.3; na++ 136; ca 9.5; gfr 48  NO NEW LABS.    Review of Systems  Unable to perform ROS: Dementia   Physical Exam Constitutional:      General: She is not in acute distress.    Appearance: She is underweight. She is not diaphoretic.  Neck:     Thyroid: No thyromegaly.  Cardiovascular:     Rate and Rhythm: Normal rate and regular rhythm.     Heart sounds: Normal heart sounds.  Pulmonary:     Effort: Pulmonary effort is normal. No respiratory distress.     Breath sounds: Normal breath sounds.  Abdominal:     General: Bowel sounds are normal. There is no distension.     Palpations: Abdomen is soft.     Tenderness: There is no abdominal tenderness.  Musculoskeletal:        General: Normal range of motion.     Cervical back: Neck supple.     Right lower leg: No edema.     Left lower leg: No edema.  Lymphadenopathy:     Cervical: No cervical adenopathy.  Skin:    General: Skin is warm and dry.  Neurological:     Mental Status: She is alert. Mental status is at baseline.  Psychiatric:     Comments: She is moaning and appears to be in pain.        ASSESSMENT/ PLAN:  TODAY  Failure to thrive in adult Vascular dementia without  behavioral disturbance Major neurocognitive disorder   Will stop the following medications: tylenol; codeine; preservision; asa; januvia; lantus; NACL;  Will begin roxanol 5 mg every 4 hours routinely for pain management  Will monitor    Synthia Innocent NP [facility]   call [phone]

## 2023-04-15 DEATH — deceased
